# Patient Record
Sex: Male | Born: 1975
Health system: Southern US, Community
[De-identification: ages and names within clinical notes are randomized; demographics above are authoritative.]

## PROBLEM LIST (undated history)

## (undated) DIAGNOSIS — K529 Noninfective gastroenteritis and colitis, unspecified: Secondary | ICD-10-CM

## (undated) DIAGNOSIS — I10 Essential (primary) hypertension: Secondary | ICD-10-CM

## (undated) DIAGNOSIS — F988 Other specified behavioral and emotional disorders with onset usually occurring in childhood and adolescence: Secondary | ICD-10-CM

## (undated) DIAGNOSIS — R42 Dizziness and giddiness: Secondary | ICD-10-CM

## (undated) DIAGNOSIS — Z8673 Personal history of transient ischemic attack (TIA), and cerebral infarction without residual deficits: Secondary | ICD-10-CM

## (undated) HISTORY — DX: Personal history of transient ischemic attack (TIA), and cerebral infarction without residual deficits: Z86.73

## (undated) HISTORY — PX: MOUTH SURGERY: SHX715

---

## 2003-01-28 ENCOUNTER — Encounter: Payer: Self-pay | Admitting: Pulmonary Disease

## 2003-06-22 ENCOUNTER — Encounter: Payer: Self-pay | Admitting: Pulmonary Disease

## 2003-09-06 ENCOUNTER — Emergency Department (HOSPITAL_COMMUNITY): Admission: EM | Admit: 2003-09-06 | Discharge: 2003-09-07 | Payer: Self-pay

## 2004-01-22 ENCOUNTER — Emergency Department (HOSPITAL_COMMUNITY): Admission: EM | Admit: 2004-01-22 | Discharge: 2004-01-22 | Payer: Self-pay | Admitting: Emergency Medicine

## 2004-01-23 ENCOUNTER — Inpatient Hospital Stay (HOSPITAL_COMMUNITY): Admission: EM | Admit: 2004-01-23 | Discharge: 2004-01-26 | Payer: Self-pay | Admitting: Emergency Medicine

## 2005-01-13 ENCOUNTER — Encounter: Admission: RE | Admit: 2005-01-13 | Discharge: 2005-01-13 | Payer: Self-pay | Admitting: Family Medicine

## 2006-01-04 ENCOUNTER — Emergency Department (HOSPITAL_COMMUNITY): Admission: EM | Admit: 2006-01-04 | Discharge: 2006-01-04 | Payer: Self-pay | Admitting: Emergency Medicine

## 2007-01-06 ENCOUNTER — Emergency Department (HOSPITAL_COMMUNITY): Admission: EM | Admit: 2007-01-06 | Discharge: 2007-01-06 | Payer: Self-pay | Admitting: Family Medicine

## 2007-01-28 ENCOUNTER — Emergency Department (HOSPITAL_COMMUNITY): Admission: EM | Admit: 2007-01-28 | Discharge: 2007-01-28 | Payer: Self-pay | Admitting: Family Medicine

## 2007-10-31 ENCOUNTER — Emergency Department (HOSPITAL_COMMUNITY): Admission: EM | Admit: 2007-10-31 | Discharge: 2007-10-31 | Payer: Self-pay | Admitting: Emergency Medicine

## 2008-04-30 ENCOUNTER — Emergency Department (HOSPITAL_COMMUNITY): Admission: EM | Admit: 2008-04-30 | Discharge: 2008-04-30 | Payer: Self-pay | Admitting: Emergency Medicine

## 2009-01-05 ENCOUNTER — Emergency Department (HOSPITAL_COMMUNITY): Admission: EM | Admit: 2009-01-05 | Discharge: 2009-01-05 | Payer: Self-pay | Admitting: Emergency Medicine

## 2009-07-09 ENCOUNTER — Emergency Department (HOSPITAL_BASED_OUTPATIENT_CLINIC_OR_DEPARTMENT_OTHER): Admission: EM | Admit: 2009-07-09 | Discharge: 2009-07-09 | Payer: Self-pay | Admitting: Emergency Medicine

## 2010-01-17 ENCOUNTER — Ambulatory Visit: Payer: Self-pay | Admitting: Internal Medicine

## 2010-01-17 DIAGNOSIS — E785 Hyperlipidemia, unspecified: Secondary | ICD-10-CM

## 2010-01-17 DIAGNOSIS — I152 Hypertension secondary to endocrine disorders: Secondary | ICD-10-CM | POA: Insufficient documentation

## 2010-01-17 DIAGNOSIS — G4733 Obstructive sleep apnea (adult) (pediatric): Secondary | ICD-10-CM

## 2010-01-17 DIAGNOSIS — Z9989 Dependence on other enabling machines and devices: Secondary | ICD-10-CM

## 2010-01-17 DIAGNOSIS — E7800 Pure hypercholesterolemia, unspecified: Secondary | ICD-10-CM | POA: Insufficient documentation

## 2010-01-17 DIAGNOSIS — I10 Essential (primary) hypertension: Secondary | ICD-10-CM

## 2010-01-17 DIAGNOSIS — E1110 Type 2 diabetes mellitus with ketoacidosis without coma: Secondary | ICD-10-CM

## 2010-01-18 ENCOUNTER — Telehealth: Payer: Self-pay | Admitting: Internal Medicine

## 2010-01-22 ENCOUNTER — Encounter: Payer: Self-pay | Admitting: Internal Medicine

## 2010-01-31 ENCOUNTER — Telehealth: Payer: Self-pay | Admitting: Internal Medicine

## 2010-02-02 ENCOUNTER — Ambulatory Visit: Payer: Self-pay | Admitting: Pulmonary Disease

## 2010-02-03 DIAGNOSIS — J45909 Unspecified asthma, uncomplicated: Secondary | ICD-10-CM

## 2010-03-08 ENCOUNTER — Ambulatory Visit (HOSPITAL_BASED_OUTPATIENT_CLINIC_OR_DEPARTMENT_OTHER): Admission: RE | Admit: 2010-03-08 | Discharge: 2010-03-08 | Payer: Self-pay | Admitting: Pulmonary Disease

## 2010-03-08 ENCOUNTER — Encounter: Payer: Self-pay | Admitting: Pulmonary Disease

## 2010-03-15 ENCOUNTER — Ambulatory Visit: Payer: Self-pay | Admitting: Pulmonary Disease

## 2010-03-22 ENCOUNTER — Ambulatory Visit: Payer: Self-pay | Admitting: Pulmonary Disease

## 2010-03-22 DIAGNOSIS — M549 Dorsalgia, unspecified: Secondary | ICD-10-CM

## 2010-03-22 DIAGNOSIS — F17200 Nicotine dependence, unspecified, uncomplicated: Secondary | ICD-10-CM | POA: Insufficient documentation

## 2010-04-19 ENCOUNTER — Encounter: Payer: Self-pay | Admitting: Pulmonary Disease

## 2010-05-18 ENCOUNTER — Emergency Department (HOSPITAL_BASED_OUTPATIENT_CLINIC_OR_DEPARTMENT_OTHER): Admission: EM | Admit: 2010-05-18 | Discharge: 2010-05-18 | Payer: Self-pay | Admitting: Emergency Medicine

## 2010-10-14 ENCOUNTER — Encounter: Payer: Self-pay | Admitting: Family Medicine

## 2010-10-15 ENCOUNTER — Emergency Department (HOSPITAL_BASED_OUTPATIENT_CLINIC_OR_DEPARTMENT_OTHER)
Admission: EM | Admit: 2010-10-15 | Discharge: 2010-10-15 | Payer: Self-pay | Source: Home / Self Care | Admitting: Emergency Medicine

## 2010-10-21 LAB — CONVERTED CEMR LAB
ALT: 27 units/L (ref 0–53)
AST: 23 units/L (ref 0–37)
Albumin: 3.5 g/dL (ref 3.5–5.2)
Alkaline Phosphatase: 107 units/L (ref 39–117)
BUN: 9 mg/dL (ref 6–23)
Basophils Absolute: 0.1 10*3/uL (ref 0.0–0.1)
Basophils Relative: 0.7 % (ref 0.0–3.0)
Bilirubin, Direct: 0.2 mg/dL (ref 0.0–0.3)
CO2: 27 meq/L (ref 19–32)
Calcium: 9 mg/dL (ref 8.4–10.5)
Chloride: 102 meq/L (ref 96–112)
Cholesterol: 154 mg/dL (ref 0–200)
Creatinine, Ser: 0.6 mg/dL (ref 0.4–1.5)
Creatinine,U: 184.9 mg/dL
Direct LDL: 64.5 mg/dL
Eosinophils Absolute: 0.5 10*3/uL (ref 0.0–0.7)
Eosinophils Relative: 4.9 % (ref 0.0–5.0)
GFR calc non Af Amer: 166.6 mL/min (ref >60)
Glucose, Bld: 280 mg/dL — ABNORMAL HIGH (ref 70–99)
HCT: 44.1 % (ref 39.0–52.0)
HDL: 32.2 mg/dL — ABNORMAL LOW (ref >39.00)
Hemoglobin, Urine: NEGATIVE
Hemoglobin: 15.2 g/dL (ref 13.0–17.0)
Hgb A1c MFr Bld: 9.9 % — ABNORMAL HIGH (ref 4.6–6.5)
Ketones, ur: 40 mg/dL
Leukocytes, UA: NEGATIVE
Lymphocytes Relative: 25.3 % (ref 12.0–46.0)
Lymphs Abs: 2.8 10*3/uL (ref 0.7–4.0)
MCHC: 34.5 g/dL (ref 30.0–36.0)
MCV: 80.8 fL (ref 78.0–100.0)
Microalb Creat Ratio: 135.2 mg/g — ABNORMAL HIGH (ref 0.0–30.0)
Microalb, Ur: 25 mg/dL — ABNORMAL HIGH (ref 0.0–1.9)
Monocytes Absolute: 0.5 10*3/uL (ref 0.1–1.0)
Monocytes Relative: 4.3 % (ref 3.0–12.0)
Neutro Abs: 7.3 10*3/uL (ref 1.4–7.7)
Neutrophils Relative %: 64.8 % (ref 43.0–77.0)
Nitrite: NEGATIVE
Platelets: 394 10*3/uL (ref 150.0–400.0)
Potassium: 4.4 meq/L (ref 3.5–5.1)
RBC: 5.46 M/uL (ref 4.22–5.81)
RDW: 14.1 % (ref 11.5–14.6)
Sodium: 138 meq/L (ref 135–145)
Specific Gravity, Urine: >=1.03 (ref 1.000–1.030)
TSH: 1.3 microintl units/mL (ref 0.35–5.50)
Total Bilirubin: 1 mg/dL (ref 0.3–1.2)
Total CHOL/HDL Ratio: 5
Total Protein, Urine: 100 mg/dL
Total Protein: 7.5 g/dL (ref 6.0–8.3)
Triglycerides: 334 mg/dL — ABNORMAL HIGH (ref 0.0–149.0)
Urine Glucose: 500 mg/dL
Urobilinogen, UA: 0.2 (ref 0.0–1.0)
VLDL: 66.8 mg/dL — ABNORMAL HIGH (ref 0.0–40.0)
WBC: 11.2 10*3/uL — ABNORMAL HIGH (ref 4.5–10.5)
pH: 5.5 (ref 5.0–8.0)

## 2010-10-23 NOTE — Assessment & Plan Note (Signed)
Summary: rov after npsg and spiro pre and post   Visit Type:  Follow-up Primary Provider/Referring Provider:  Etta Grandchild MD  CC:  Pt here for follow after sleep study and pre/post. Pt states he is smoking 1/2 ppd.  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 He reports asthma since childhood, never hospitalised, last urgent care visit 2 yrs ago, worse during spring, prefers primatene mist to albuterol mdi (less expensive). He was diagnosed with severe obstructive sleep apnea after a sleep study at [facility] in 2005 & has been on cpap ever since, cannot tolerate a single night off it. Has not received supplies x 2 yrs. Epworth Sleepiness Score 1. Bedtime is 2300-0100, weekends 0300, latency minimal, 1-2 BR visits, no post void latency, wakes up at 0600 for work, 1100 on weekends feeling refreshed.no dryness or headaches. He drinks a 'lot of coffee' but has given this up x 1 week together with cigs.  March 22, 2010 12:03 PM  Baseline PSG 10/04 , wt 352, BMI 53 showed RDI 30/h, corrected by CPAP 9 cm CPAP currently set at 14 cm, PSG showed this level corrects events but needs 19 cm to abolish snoring - obtain download at current level . C/o back pain - acute onset, no weakness of extremities. Spirometry FEv1 50%, pos BD response Fiancee not c/o snoring, no wheezing , cough or clearing of throat.    Preventive Screening-Counseling & Management  Alcohol-Tobacco     Smoking Status: current  Current Medications (verified): 1)  Enalapril Maleate 5 Mg Tabs (Enalapril Maleate) .... Take 1 Tablet By Mouth Once A Day 2)  Glyburide-Metformin 2.5-500 Mg Tabs (Glyburide-Metformin) .... Take 1 Tablet By Mouth Once A Day 3)  Accu-Chek Aviva  Strp (Glucose Blood) .... Test Once Daily As Directed 4)  Primatene Mist 0.22 Mg/act Aers (Epinephrine Base) .... As Needed  Allergies (verified): No Known Drug  Allergies  Past History:  Past Medical History: Last updated: 01/17/2010 Diabetes mellitus, type II Hyperlipidemia Hypertension  Social History: Last updated: 03/22/2010 Occupation: Information Technology Alcohol use-no Drug use-no Regular exercise-no Separated Patient is a current smoker.   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  Smoking Status:  current  Review of Systems       The patient complains of dyspnea on exertion.  The patient denies anorexia, fever, weight loss, weight gain, vision loss, decreased hearing, hoarseness, chest pain, syncope, peripheral edema, prolonged cough, headaches, hemoptysis, abdominal pain, melena, hematochezia, severe indigestion/heartburn, hematuria, muscle weakness, suspicious skin lesions, difficulty walking, depression, unusual weight change, and abnormal bleeding.    Vital Signs:  Patient profile:   35 year old male Height:      69 inches Weight:      365 pounds BMI:     54.10 O2 Sat:      95 % on Room air Temp:     98.2 degrees F oral Pulse rate:   89 / minute BP sitting:   128 / 70  (left arm) Cuff size:   large  Vitals Entered By: Zackery Barefoot CMA (March 22, 2010 11:44 AM)  O2 Flow:  Room air CC: Pt here for follow after sleep study and pre/post. Pt states he is smoking 1/2 ppd Comments Medications reviewed with patient Verified contact number and pharmacy with patient Zackery Barefoot CMA  March 22, 2010 11:45 AM  Physical Exam  Additional Exam:  Gen. Pleasant, obese, in no distress, normal affect ENT - no lesions, no post nasal drip, class 3 airway Neck: No JVD, no thyromegaly, no carotid bruits Lungs: no use of accessory muscles, no dullness to percussion, clear without rales or rhonchi  Cardiovascular: Rhythm regular, heart sounds  normal, no murmurs or gallops, no peripheral edema Musculoskeletal: No  deformities, no cyanosis or clubbing      Impression & Recommendations:  Problem # 1:  ASTHMA (ICD-493.90) reversible component Trial of symbicort - he is woried about cost - discussde long term effects of oral steroids & why maintenance medicationi sporeferred - for sake of compliance would prefer at bedtime dosing  Problem # 2:  TOBACCO ABUSE (ICD-305.1) not willing to commit to quit attempt His updated medication list for this problem includes:    Nicotrol 10 Mg Inha (Nicotine) .Marland Kitchen..Marland Kitchen Two times a day as needed  Problem # 3:  SLEEP APNEA (ICD-780.57) CPAP 14 cm - will not try to abolish snoring since fiance not complaining. Compliance encouraged, wt loss emphasized, asked to avoid meds with sedative side effects, cautioned against driving when sleepy.  Orders: Est. Patient Level IV (04540) Prescription Created Electronically [phone])  Problem # 4:  BACK PAIN (ICD-724.5) tramadol as needed two times a day   Medications Added to Medication List This Visit: 1)  Nicotrol 10 Mg Inha (Nicotine) .... Two times a day as needed 2)  Symbicort 80-4.5 Mcg/act Aero (Budesonide-formoterol fumarate) .... 2 puffs at bedtime - rinse mouth after 3)  Tramadol Hcl 50 Mg Tabs (Tramadol hcl) .... Two times a day as needed  Patient Instructions: 1)  Copy sent to: 2)  Please schedule a follow-up appointment in 2 months. 3)  Trial of nicotrol inhaler 4)  Symbicort 80/4.5 2 puffs at bedtime - RINSE mouth after 5)  stay on CPAP 14 cm Prescriptions: TRAMADOL HCL 50 MG TABS (TRAMADOL HCL) two times a day as needed  #30 x 1   Entered and Authorized by:   Comer Locket. Vassie Loll MD   Signed by:   Comer Locket Vassie Loll MD on 03/22/2010   Method used:   Electronically to        [facility]. [address] [phone]* (retail)       [address]       [place], [place]  [address]       Ph: [phone] or [phone]       Fax: [phone]   RxID:   [id number] SYMBICORT 80-4.5 MCG/ACT AERO  (BUDESONIDE-FORMOTEROL FUMARATE) 2 puffs at bedtime - RINSE mouth after  #1 x 2   Entered and Authorized by:   Comer Locket Vassie Loll MD   Signed by:   Comer Locket Vassie Loll MD on 03/22/2010   Method used:   Electronically to        [facility]. [address] [phone]* (retail)       [address]       [place], [place]  [address]       Ph: [phone] or [phone]       Fax: [phone]   RxID:   [id number] NICOTROL 10 MG INHA (NICOTINE) two times a day as needed  #30 x 1   Entered and Authorized by:   Comer Locket. Vassie Loll MD   Signed by:   Comer Locket Vassie Loll MD on 03/22/2010   Method used:  Electronically to        [facility]. [address] [phone]* (retail)       [address]       [place], [place]  [address]       Ph: [phone] or [phone]       Fax: [phone]   RxID:   [id number]    Immunization History:  Influenza Immunization History:    Influenza:  historical (06/26/2009)    Appended Document: rov after npsg and spiro pre and post good compliance on download 6/20 -04/18/10

## 2010-10-23 NOTE — Assessment & Plan Note (Signed)
Summary: sleep apnea/jd   Primary Provider/Referring Provider:  Etta Grandchild MD  CC:  Pt here for sleep consult.  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 He reports asthma since childhood, never hospitalised, last urgent care visit 2 yrs ago, worse during spring, prefers primatene mist to albuterol mdi (less expensive). He was diagnosed with severe obstructive sleep apnea after a sleep study at [facility] in 2005 & has been on cpap ever since, cannot tolerate a single night off it. Has not received supplies x 2 yrs. Epworth Sleepiness Score 1. Bedtime is 2300-0100, weekends 0300, latency minimal, 1-2 BR visits, no post void latency, wakes up at 0600 for work, 1100 on weekends feeling refreshed.no dryness or headaches. He drinks a 'lot of coffee' but has given this up x 1 week together with cigs. There is no history suggestive of cataplexy, sleep paralysis or parasomnias    Preventive Screening-Counseling & Management  Alcohol-Tobacco     Smoking Status: quit     Packs/Day: 1.0     Year Started: 2004     Year Quit: 2011   History of Present Illness: I have sleep apnea  What time do you typically go to bed?(between what hours): 11:00pm-1:30am  How long does it take you to fall asleep? not long at all, just a few minutes  How many times during the night do you wake up? 1 or 2 times for bathroom  What time do you get out of bed to start your day? 6:00am  Do you drive or operate heavy machinery in your occupation? no  How much has your weight changed (up or down) over the past two years? (in pounds): 350-371 lbs Current 360  Have you ever had a sleep study before?  If yes,when and where: yes 2005@ [facility]  Do you currently use CPAP ? If so , at what pressure? yes I do not know pressure  Do you wear oxygen at any time? If yes, how many liters per minute? no Current Medications  (verified): 1)  Enalapril Maleate 5 Mg Tabs (Enalapril Maleate) .... Take 1 Tablet By Mouth Once A Day 2)  Glyburide-Metformin 2.5-500 Mg Tabs (Glyburide-Metformin) .... Take 1 Tablet By Mouth Once A Day 3)  Accu-Chek Aviva  Strp (Glucose Blood) .... Test Once Daily As Directed 4)  Primatene Mist 0.22 Mg/act Aers (Epinephrine Base) .... 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 (Quit Jan 30, 2010 1 ppd) Smoking Status:  quit Packs/Day:  1.0  Review of Systems  The patient denies shortness of breath with activity, shortness of breath at rest, productive cough, non-productive cough, coughing up blood, chest pain, irregular heartbeats, acid heartburn, indigestion, loss of appetite, weight change, abdominal pain, difficulty swallowing, sore throat, tooth/dental problems, headaches, nasal congestion/difficulty breathing through nose, sneezing, itching, ear ache, anxiety, depression, hand/feet swelling, joint stiffness or pain, rash, change in color of mucus, and fever.    Vital Signs:  Patient profile:   35 year old male Height:      69 inches O2 Sat:      97 % on Room air Temp:     99.1 degrees F oral Pulse  rate:   95 / minute BP sitting:   136 / 78  (left arm) Cuff size:   large  Vitals Entered By: Zackery Barefoot CMA (Feb 02, 2010 3:37 PM)  O2 Flow:  Room air CC: Pt here for sleep consult Comments Medications reviewed with patient Verified contact number and pharmacy with patient Zackery Barefoot CMA  Feb 02, 2010 3:38 PM    Physical Exam  Additional Exam:  Gen. Pleasant, obese, in no distress, normal affect ENT - no  lesions, no post nasal drip, class 3 airway Neck: No JVD, no thyromegaly, no carotid bruits Lungs: no use of accessory muscles, no dullness to percussion, clear without rales or rhonchi  Cardiovascular: Rhythm regular, heart sounds  normal, no murmurs or gallops, no peripheral edema Abdomen: soft and non-tender, no hepatosplenomegaly, BS normal. Musculoskeletal: No deformities, no cyanosis or clubbing Neuro:  alert, non focal     Impression & Recommendations:  Problem # 1:  ASTHMA (ICD-493.90) mild intermittent, if at all. Discussed dangers of primatene in a diabetic , hypretensive would prefer albuterol - or even singulair if predominant allergic spiro-pre/post   Problem # 2:  SLEEP APNEA (ICD-780.57) The pathophysiology of obstructive sleep apnea, it's cardiovascular consequences and modes of treatment including CPAP were discussed with the patient in great detail.  New supplies Compliance encouraged, wt loss emphasized, asked to avoid meds with sedative side effects, cautioned against driving when sleepy.  Orders: Sleep Disorder Referral (Sleep Disorder) Pulmonary Referral (Pulmonary) Consultation Level IV (65784) DME Referral (DME)  Medications Added to Medication List This Visit: 1)  Primatene Mist 0.22 Mg/act Aers (Epinephrine base) .... As needed  Patient Instructions: 1)  CPAP titration study 2)  Please schedule a follow-up appointment in 2 weeks after sleep study. 3)  Spirometry -pre/post  Appended Document: sleep apnea/jd Baseline PSG 10/04 , wt 352, BMI 53 showed RDI 30/h, corrected by CPAP 9 cm  Appended Document: Orders Update CPAP currently set at 14 cm, PSG showed this level corrects events but needs 19 cm to abolish snoring - obtain download at current level & FU OV   Clinical Lists Changes  Orders: Added new Referral order of DME Referral (DME) - Signed      Appended Document: sleep apnea/jd Spoke with pt and advised of the above recs per RA.  Pt  verbalized understanding and states will keep upcoming appt.

## 2010-10-23 NOTE — Progress Notes (Signed)
Summary: lab results  Phone Note Call from Patient Call back at Home Phone [phone]   Caller: Patient Summary of Call: Patient is requesting lab work results//Ok to lmovm Initial call taken by: Rock Nephew CMA,  January 18, 2010 1:33 PM  Follow-up for Phone Call        blood sugars are too high, see letter Follow-up by: Etta Grandchild MD,  Jan 22, 2010 7:56 AM  Additional Follow-up for Phone Call Additional follow up Details #1::        Patient notified and aware that letter has been mailed Additional Follow-up by: Rock Nephew CMA,  Jan 25, 2010 9:55 AM

## 2010-10-23 NOTE — Progress Notes (Signed)
Summary: TEST STRIPS  Phone Note Call from Patient Call back at [facility] [place] Phone [phone]   Summary of Call: Patient is requesting rx for test strips for glucometer.  Initial call taken by: Lamar Sprinkles, CMA,  Jan 31, 2010 5:18 PM  Follow-up for Phone Call        left mess to call office back w/info about glucometer............Marland KitchenLamar Sprinkles, CMA  Jan 31, 2010 6:24 PM   Patient was given Accucheck Aviva aat last appt. Rx sent in.Marland KitchenMarland KitchenAlvy Beal Archie CMA  Feb 01, 2010 2:02 PM     New/Updated Medications: ACCU-CHEK AVIVA  STRP (GLUCOSE BLOOD) Test once daily as directed Prescriptions: ACCU-CHEK AVIVA  STRP (GLUCOSE BLOOD) Test once daily as directed  #100 x 6   Entered by:   Rock Nephew CMA   Authorized by:   Etta Grandchild MD   Signed by:   Rock Nephew CMA on 02/01/2010   Method used:   Electronically to        [facility] [address] [phone]* (retail)       [address]       [place], [place]  [address]       Ph: [phone] or [phone]       Fax: [phone]   RxID:   [id number]

## 2010-10-23 NOTE — Miscellaneous (Signed)
Summary: Orders Update pft charges  Clinical Lists Changes  Orders: Added new Service order of Carbon Monoxide diffusing w/capacity (94720) - Signed Added new Service order of Lung Volumes (94240) - Signed Added new Service order of Spirometry (Pre & Post) (94060) - Signed 

## 2010-10-23 NOTE — Letter (Signed)
Summary: Results Follow-up Letter  [facility]  [address] [place], [place] [address]   Phone: [phone]  Fax: [phone]    01/22/2010  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

## 2010-10-23 NOTE — Letter (Signed)
Summary: Lipid Letter  [facility]  [address] [place], [place] [address]   Phone: [phone]  Fax: [phone]    01/22/2010  [name] [address] [place], [place]  [address]  Dear Gerlene Burdock:  We have carefully reviewed your last lipid profile from  and the results are noted below with a summary of recommendations for lipid management.    Cholesterol:       154     Goal: <200   HDL "good" Cholesterol:   57.84     Goal: >40   LDL "bad" Cholesterol:   65     Goal: <130   Triglycerides:       334.0     Goal: <150    the triglycerides are too high    TLC Diet (Therapeutic Lifestyle Change): Saturated Fats & Transfatty acids should be kept < 7% of total calories ***Reduce Saturated Fats Polyunstaurated Fat can be up to 10% of total calories Monounsaturated Fat Fat can be up to 20% of total calories Total Fat should be no greater than 25-35% of total calories Carbohydrates should be 50-60% of total calories Protein should be approximately 15% of total calories Fiber should be at least 20-30 grams a day ***Increased fiber may help lower LDL Total Cholesterol should be < 200mg /day Consider adding plant stanol/sterols to diet (example: Benacol spread) ***A higher intake of unsaturated fat may reduce Triglycerides and Increase HDL    Adjunctive Measures (may lower LIPIDS and reduce risk of Heart Attack) include: Aerobic Exercise (20-30 minutes 3-4 times a week) Limit Alcohol Consumption Weight Reduction Aspirin 75-81 mg a day by mouth (if not allergic or contraindicated) Dietary Fiber 20-30 grams a day by mouth     Current Medications: 1)    Enalapril Maleate 5 Mg Tabs (Enalapril maleate) .... Take 1 tablet by mouth once a day 2)    Glyburide-metformin 2.5-500 Mg Tabs (Glyburide-metformin) .... Take 1 tablet by mouth once a day  If you have any questions, please call. We appreciate being able to work with you.   Sincerely,    [facility] Etta Grandchild MD

## 2010-10-23 NOTE — Assessment & Plan Note (Signed)
Summary: NEW [facility] PT---#--STC   Vital Signs:  Patient profile:   35 year old male Height:      69 inches Weight:      356 pounds BMI:     52.76 O2 Sat:      95 % on Room air Temp:     98.6 degrees F oral Pulse rate:   96 / minute Pulse rhythm:   regular Resp:     16 per minute BP sitting:   134 / 84  (left arm) Cuff size:   large  Vitals Entered By: Rock Nephew CMA (January 17, 2010 1:34 PM)  Nutrition Counseling: Patient's BMI is greater than 25 and therefore counseled on weight management options.  O2 Flow:  Room air  Primary Care Provider:  Etta Grandchild MD   History of Present Illness:  Follow-Up Visit      This is a 35 year old man who presents for Follow-up visit.  The patient complains of high blood sugar symptoms, but denies chest pain, palpitations, dizziness, syncope, edema, SOB, DOE, PND, and orthopnea.  The patient reports not taking meds as prescribed, not monitoring BP, not monitoring blood sugars, and dietary noncompliance.  When questioned about possible medication side effects, the patient notes none.    Also, he wants to be screened for STD's. He recently found out that his wife has had atleast 15 sexual partners in the last 5 years. He has never had an STD and he says that he was vaccinated for Hep. B in 2001.  Preventive Screening-Counseling & Management  Alcohol-Tobacco     Alcohol drinks/day: 0     Smoking Status: current     Smoking Cessation Counseling: yes     Smoke Cessation Stage: precontemplative     Packs/Day: <0.25     Year Started: 2001     Pack years: 5     Tobacco Counseling: to quit use of tobacco products  Caffeine-Diet-Exercise     Does Patient Exercise: no  Hep-HIV-STD-Contraception     Hepatitis Risk: risk noted     HIV Risk: risk noted     STD Risk: risk noted     STD Risk Counseling: to avoid increased STD risk     TSE monthly: yes     Testicular SE Education/Counseling to perform regular STE  Safety-Violence-Falls  Seat Belt Use: yes     Helmet Use: yes     Firearms in the Home: no firearms in the home     Smoke Detectors: yes     Violence in the Home: no risk noted     Sexual Abuse: no      Drug Use:  never and no.        Blood Transfusions:  no.    Medications Prior to Update: 1)  None  Current Medications (verified): 1)  Enalapril Maleate 5 Mg Tabs (Enalapril Maleate) .... Take 1 Tablet By Mouth Once A Day 2)  Glyburide-Metformin 2.5-500 Mg Tabs (Glyburide-Metformin) .... Take 1 Tablet By Mouth Once A Day  Allergies (verified): No Known Drug Allergies  Past History:  Past Medical History: Diabetes mellitus, type II Hyperlipidemia Hypertension  Past Surgical History: Denies surgical history  Family History: Reviewed history and no changes required. Family History of Arthritis Family History Diabetes 1st degree relative Family History High cholesterol Family History Kidney disease Family History of Prostate CA 1st degree relative <50  Social History: Reviewed history and no changes required. Occupation: Firefighter Alcohol use-no Drug use-no  Regular exercise-no Married Current Smoker Drug Use:  never, no Does Patient Exercise:  no Smoking Status:  current Packs/Day:  <0.25 Hepatitis Risk:  risk noted HIV Risk:  risk noted STD Risk:  risk noted Seat Belt Use:  yes Blood Transfusions:  no  Review of Systems  The patient denies anorexia, fever, weight loss, weight gain, vision loss, chest pain, syncope, dyspnea on exertion, peripheral edema, prolonged cough, headaches, hemoptysis, abdominal pain, melena, hematochezia, severe indigestion/heartburn, hematuria, genital sores, suspicious skin lesions, enlarged lymph nodes, angioedema, and testicular masses.   GU:  Denies discharge, dysuria, genital sores, hematuria, nocturia, urinary frequency, and urinary hesitancy. Endo:  Denies cold intolerance, excessive hunger, excessive thirst, excessive urination, heat  intolerance, polyuria, and weight change.  Physical Exam  General:  alert, well-developed, well-nourished, well-hydrated, and overweight-appearing.   Head:  normocephalic, atraumatic, no abnormalities observed, and no abnormalities palpated.   Eyes:  vision grossly intact, pupils equal, pupils round, and pupils reactive to light.   Mouth:  Oral mucosa and oropharynx without lesions or exudates.  Teeth in good repair. Neck:  No deformities, masses, or tenderness noted. Lungs:  Normal respiratory effort, chest expands symmetrically. Lungs are clear to auscultation, no crackles or wheezes. Heart:  Normal rate and regular rhythm. S1 and S2 normal without gallop, murmur, click, rub or other extra sounds. Abdomen:  Bowel sounds positive,abdomen soft and non-tender without masses, organomegaly or hernias noted. Genitalia:  circumcised, no hydrocele, no varicocele, no scrotal masses, no testicular masses or atrophy, no cutaneous lesions, and no urethral discharge.   Msk:  No deformity or scoliosis noted of thoracic or lumbar spine.   Pulses:  R and L carotid,radial,femoral,dorsalis pedis and posterior tibial pulses are full and equal bilaterally Extremities:  No clubbing, cyanosis, edema, or deformity noted with normal full range of motion of all joints.   Neurologic:  No cranial nerve deficits noted. Station and gait are normal. Plantar reflexes are down-going bilaterally. DTRs are symmetrical throughout. Sensory, motor and coordinative functions appear intact. Skin:  turgor normal, color normal, no rashes, no suspicious lesions, no ecchymoses, no petechiae, no purpura, no ulcerations, and no edema.   Cervical Nodes:  no anterior cervical adenopathy and no posterior cervical adenopathy.   Axillary Nodes:  no R axillary adenopathy and no L axillary adenopathy.   Inguinal Nodes:  no R inguinal adenopathy and no L inguinal adenopathy.   Psych:  Cognition and judgment appear intact. Alert and cooperative  with normal attention span and concentration. No apparent delusions, illusions, hallucinations  Diabetes Management Exam:    Foot Exam (with socks and/or shoes not present):       Sensory-Pinprick/Light touch:          Left medial foot (L-4): normal          Left dorsal foot (L-5): normal          Left lateral foot (S-1): normal          Right medial foot (L-4): normal          Right dorsal foot (L-5): normal          Right lateral foot (S-1): normal       Sensory-Monofilament:          Left foot: normal          Right foot: normal       Inspection:          Left foot: normal  Right foot: normal       Nails:          Left foot: normal          Right foot: normal    Eye Exam:       Eye Exam done elsewhere          Date: 03/08/2009          Results: normal          Done by: ???   Impression & Recommendations:  Problem # 1:  SEXUAL ACTIVITY, HIGH RISK (ICD-V69.2) Assessment New  Orders: Venipuncture (16109) TLB-Lipid Panel (80061-LIPID) TLB-BMP (Basic Metabolic Panel-BMET) (80048-METABOL) TLB-CBC Platelet - w/Differential (85025-CBCD) TLB-Hepatic/Liver Function Pnl (80076-HEPATIC) TLB-TSH (Thyroid Stimulating Hormone) (84443-TSH) TLB-A1C / Hgb A1C (Glycohemoglobin) (83036-A1C) TLB-Microalbumin/Creat Ratio, Urine (82043-MALB) TLB-Udip w/ Micro (81001-URINE) T-Chlamydia  Probe, urine (60454-09811) T-GC Probe, urine (91478-29562) T-HIV Antibody  (Reflex) [id number]) T-RPR (Syphilis) (96295-28413) T-Hepatitis A Antibody (24401-02725)  Problem # 2:  SLEEP APNEA (ICD-780.57) Assessment: Unchanged  Orders: Venipuncture (36644) TLB-Lipid Panel (80061-LIPID) TLB-BMP (Basic Metabolic Panel-BMET) (80048-METABOL) TLB-CBC Platelet - w/Differential (85025-CBCD) TLB-Hepatic/Liver Function Pnl (80076-HEPATIC) TLB-TSH (Thyroid Stimulating Hormone) (84443-TSH) TLB-A1C / Hgb A1C (Glycohemoglobin) (83036-A1C) TLB-Microalbumin/Creat Ratio, Urine (82043-MALB) TLB-Udip w/  Micro (81001-URINE) T-Chlamydia  Probe, urine (03474-25956) T-GC Probe, urine [id number]) T-HIV Antibody  (Reflex) [id number]) T-RPR (Syphilis) (30160-10932) Sleep Disorder Referral (Sleep Disorder)  Problem # 3:  HYPERTENSION (ICD-401.9) Assessment: Improved  His updated medication list for this problem includes:    Enalapril Maleate 5 Mg Tabs (Enalapril maleate) .Marland Kitchen... Take 1 tablet by mouth once a day  Orders: Venipuncture (35573) TLB-Lipid Panel (80061-LIPID) TLB-BMP (Basic Metabolic Panel-BMET) (80048-METABOL) TLB-CBC Platelet - w/Differential (85025-CBCD) TLB-Hepatic/Liver Function Pnl (80076-HEPATIC) TLB-TSH (Thyroid Stimulating Hormone) (84443-TSH) TLB-A1C / Hgb A1C (Glycohemoglobin) (83036-A1C) TLB-Microalbumin/Creat Ratio, Urine (82043-MALB) TLB-Udip w/ Micro (81001-URINE) T-Chlamydia  Probe, urine (22025-42706) T-GC Probe, urine (23762-83151) T-HIV Antibody  (Reflex) ([id number]) T-RPR (Syphilis) (62694-85462)  BP today: 134/84  Problem # 4:  DIABETES MELLITUS, TYPE II (ICD-250.00) Assessment: Deteriorated he will provide records from Dr. Thedora Hinders from [facility] for me to review. His updated medication list for this problem includes:    Enalapril Maleate 5 Mg Tabs (Enalapril maleate) .Marland Kitchen... Take 1 tablet by mouth once a day    Glyburide-metformin 2.5-500 Mg Tabs (Glyburide-metformin) .Marland Kitchen... Take 1 tablet by mouth once a day  Orders: Venipuncture (70350) TLB-Lipid Panel (80061-LIPID) TLB-BMP (Basic Metabolic Panel-BMET) (80048-METABOL) TLB-CBC Platelet - w/Differential (85025-CBCD) TLB-Hepatic/Liver Function Pnl (80076-HEPATIC) TLB-TSH (Thyroid Stimulating Hormone) (84443-TSH) TLB-A1C / Hgb A1C (Glycohemoglobin) (83036-A1C) TLB-Microalbumin/Creat Ratio, Urine (82043-MALB) TLB-Udip w/ Micro (81001-URINE) T-Chlamydia  Probe, urine (09381-82993) T-GC Probe, urine (71696-78938) T-HIV Antibody  (Reflex) [id number]) T-RPR (Syphilis)  (52778-24235) Ophthalmology Referral (Ophthalmology)  Complete Medication List: 1)  Enalapril Maleate 5 Mg Tabs (Enalapril maleate) .... Take 1 tablet by mouth once a day 2)  Glyburide-metformin 2.5-500 Mg Tabs (Glyburide-metformin) .... Take 1 tablet by mouth once a day  Patient Instructions: 1)  Please schedule a follow-up appointment in 1 month. 2)  Tobacco is very bad for your health and your loved ones! You Should stop smoking!. 3)  Stop Smoking Tips: Choose a Quit date. Cut down before the Quit date. decide what you will do as a substitute when you feel the urge to smoke(gum,toothpick,exercise). 4)  It is important that you exercise regularly at least 20 minutes 5 times a week. If you develop chest pain, have severe difficulty breathing, or feel very tired , stop exercising immediately and seek medical attention. 5)  You need to lose weight. Consider a lower calorie diet and regular exercise.  6)  If you could be exposed to sexually transmitted diseases, you should use a condom. 7)  Check your blood sugars regularly. If your readings are usually above 200  or below 70 you should contact our office. 8)  It is important that your Diabetic A1c level is checked every 3 months. 9)  See your eye doctor yearly to check for diabetic eye damage. 10)  Check your feet each night for sore areas, calluses or signs of infection. 11)  Check your Blood Pressure regularly. If it is above 130/80: you should make an appointment. Prescriptions: GLYBURIDE-METFORMIN 2.5-500 MG TABS (GLYBURIDE-METFORMIN) Take 1 tablet by mouth once a day  #180 x 3   Entered and Authorized by:   Etta Grandchild MD   Signed by:   Etta Grandchild MD on 01/17/2010   Method used:   Electronically to        [facility] [address] [phone]* (retail)       [address]       [place], [place]  [address]       Ph: [phone] or [phone]       Fax: [phone]   RxID:   [id number] ENALAPRIL MALEATE 5 MG  TABS (ENALAPRIL MALEATE) Take 1 tablet by mouth once a day  #90 x 3   Entered and Authorized by:   Etta Grandchild MD   Signed by:   Etta Grandchild MD on 01/17/2010   Method used:   Electronically to        [facility] [address] [phone]* (retail)       [address]       [place], [place]  [address]       Ph: [phone] or [phone]       Fax: [phone]   RxID:   [id number]   Preventive Care Screening  Last Tetanus Booster:    Date:  09/23/2006    Results:  Historical     Immunization History:  Pneumovax Immunization History:    Pneumovax:  historical (08/23/2009)  Hepatitis B Immunization History:    Hepatitis B # 1:  hepb adult (10/02/1999)    Hepatitis B # 2:  hepb adult (11/06/1999)    Hepatitis B # 3:  hepb adult (04/01/2000)

## 2011-01-21 ENCOUNTER — Other Ambulatory Visit: Payer: Self-pay | Admitting: Internal Medicine

## 2011-02-08 NOTE — H&P (Signed)
NAME:  Michael Foster, Michael Foster                           ACCOUNT NO.:  [id number]   MEDICAL RECORD NO.:  [id number]                   PATIENT TYPE:  INP   LOCATION:  3733                                 FACILITY:  [facility]   PHYSICIAN:  Isla Pence, M.D.             DATE OF BIRTH:  1976-07-18   DATE OF ADMISSION:  01/23/2004  DATE OF DISCHARGE:                                HISTORY & PHYSICAL   ADDENDUM:  VITAL SIGNS:  On the physical exam, his pulse oximetry was 89% on  room air. I believe the systolic blood pressure was 153.  GENERAL:  He is no apparent distress until he starts talking, then he gets a  little short winded. He was initially in his son's room in the emergency  room when I walked into his room. He is obese.  HEENT:  His TMs are normal. Oropharynx shows no erythema. He does have some  post nasal drip. Nasal mucosa is slightly boggy.  NECK:  There is no adenopathy.  LUNGS:  Have expiratory wheezes throughout the lung fields, but he is moving  good breath sounds in all of his lung fields. There are no crackles on exam.  I did not hear any bronchial breath sounds.  HEART:  Regular rate and rhythm.  ABDOMEN:  Protuberant, large, obese. Bowel sounds are normal, soft,  nontender.  BACK:  There is no spinous angle tenderness, no CVA angle tenderness. Back  is slightly tender along the anterior axillary line on the right side.  NEUROLOGICAL:  There is no gross neurological deficits.  EXTREMITIES:  Lower extremities:  He has got full range of motion. There is  no edema.                                                Isla Pence, M.D.    RRV/MEDQ  D:  01/23/2004  T:  01/23/2004  Job:  [id number]
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
## 2011-02-08 NOTE — Discharge Summary (Signed)
Michael Foster, Michael Foster                           ACCOUNT NO.:  [id number]   MEDICAL RECORD NO.:  [id number]                   PATIENT TYPE:  INP   LOCATION:  6712                                 FACILITY:  [facility]   PHYSICIAN:  Jackie Plum, M.D.             DATE OF BIRTH:  Aug 06, 1976   DATE OF ADMISSION:  01/23/2004  DATE OF DISCHARGE:  01/24/2004                           DISCHARGE SUMMARY - REFERRING   DISCHARGE DIAGNOSES:  1. Community-acquired pneumonia.  2. Leukocytosis secondary to diagnosis #1 with superimposed adverse effect     of steroids.  Outpatient leukocyte count followed per primary care     physician.  3. History of sleep apnea.  4. History of gastroesophageal reflux disease.  5. History of obesity.  6. History of asthma.  7. History of allergies to Theo-Dur and Slo-bid.   DISCHARGE MEDICATIONS:  The patient is going to continue all his  preadmission medications as previously.  New medicines will be:  1. Humibid DM 1 tab b.i.d.  2. Prednisone 40 mg daily.  3. Ceftin 500 mg t.i.d.  4. Zithromax 500 mg daily.   ACTIVITY:  Activity is as tolerated.   DIET:  Diet will be a regular diet.   SPECIAL INSTRUCTIONS:  The patient is to report to his doctor if he  experiences any problems including fever, chills, shortness of breath.   FOLLOWUP:  Followup will be with [facility], Dr. Leanne Chang,  as scheduled.   CONSULTANTS:  Not applicable.   PROCEDURES:  Not applicable.   CONDITION AT DISCHARGE:  Improved and satisfactory.   DISCHARGE LABORATORIES:  WBC count of 20.3, hemoglobin 15.4, hematocrit of  47.0, MCV 12.6, platelet count 392,000; the smear showed mild left shift  with more than 20% bands.  Sodium 138, potassium 4.1, chloride 105, CO2 28,  glucose 178, BUN 11, creatinine 0.7, calcium 8.5.   REASON FOR HOSPITALIZATION:  Dyspnea secondary to community-acquired  pneumonia with mild asthma exacerbation.  Please see admission H&P by Dr.  Isla Pence dictated on Jan 23, 2004, for further insight into the  patient's  presenting symptoms, signs and evaluation at the time of  admission.  This is a 35 year old gentleman with history of asthma, acute  apnea, who presented with dyspnea.  He had been seen in the emergency room 1  day prior to admission and diagnosed with bilateral pneumonia, at which time  he was given oral prednisone and IM antibiotics and sent home with a  prescription for tetracycline and prednisone.  He had been having some  nonproductive cough without any history of heartburn.   On admission, the patient vital signs were notable for O2 saturation of 89%  on room air and he was said not to be in any acute distress.  He did not  have any JVD.  Lung exam showed expiratory wheezes throughout his lung  fields. His cardiac exam was notable for a  regular rate and rhythm without  any gallops or murmur.  He did not have any edema on his extremity exam.   Lab work on admission was notable for ABG on 50% oxygen with pH of 7.413,  PCO2 of 58, PO2 of 70, bicarb of 24.   X-rays showed patchy right upper lobe and left base infiltrates; he was  therefore admitted to the hospital for community-acquired pneumonia with  mild asthma exacerbation.   HOSPITAL COURSE:  The patient was admitted to the hospitalists' service.  IV  antibiotics were started, supplemental oxygen and IV fluids were also given.  Scheduled bronchodilator nebulizations were prescribed for the patient and  also received some antitussives.  With these measures, the patient's  symptoms started to gradually improve and he is good for discharge today.  On round today, the patient denies any history of shortness of breath, chest  pain, fevers or chills.  He has been eating and drinking well without any  problems.  His temperature is 98.9 degrees Fahrenheit, pulse of 84,  respiratory rate of 20 per minute, BP of 117/50, O2 saturation of 95% on  room air.   He did not have any JVD.  His mucous membranes are moist.  Lung  exam is notable for vesicular breath sounds which are adequate with just a  few occasional wheezes.  Cardiac exam is notable for regular rate and  rhythm, no gallops or murmur.  Abdomen is soft and nontender.  Bowel sounds  are present and they are normoactive.  Extremities are negative for any  edema.  He is alert and oriented x3 and ready for home discharge.  The  patient is to continue antibiotics on discharge to make a total of 10 days  of treatment.  He will take prednisone for the next 2 days, then stop, and  he will be continued on his expectorants as well.  His medications at home  he will be using as usual, as well as his CPAP at home as previously.                                                Jackie Plum, M.D.    GO/MEDQ  D:  01/26/2004  T:  01/26/2004  Job:  [id number]   cc:   Leanne Chang, M.D.  [address]  [place]  [place] [address]  Fax: [phone]

## 2011-02-20 ENCOUNTER — Inpatient Hospital Stay (HOSPITAL_COMMUNITY)
Admission: RE | Admit: 2011-02-20 | Discharge: 2011-02-20 | Disposition: A | Discharge status: Home / Self Care | Payer: 59 | Source: Ambulatory Visit | Attending: Family Medicine | Admitting: Family Medicine

## 2011-02-20 ENCOUNTER — Emergency Department (HOSPITAL_BASED_OUTPATIENT_CLINIC_OR_DEPARTMENT_OTHER)
Admission: EM | Admit: 2011-02-20 | Discharge: 2011-02-20 | Disposition: A | Discharge status: Home / Self Care | Payer: 59 | Attending: Emergency Medicine | Admitting: Emergency Medicine

## 2011-02-20 DIAGNOSIS — I10 Essential (primary) hypertension: Secondary | ICD-10-CM | POA: Insufficient documentation

## 2011-02-20 DIAGNOSIS — X500XXA Overexertion from strenuous movement or load, initial encounter: Secondary | ICD-10-CM | POA: Insufficient documentation

## 2011-02-20 DIAGNOSIS — J45909 Unspecified asthma, uncomplicated: Secondary | ICD-10-CM | POA: Insufficient documentation

## 2011-02-20 DIAGNOSIS — E119 Type 2 diabetes mellitus without complications: Secondary | ICD-10-CM | POA: Insufficient documentation

## 2011-02-20 DIAGNOSIS — Y92009 Unspecified place in unspecified non-institutional (private) residence as the place of occurrence of the external cause: Secondary | ICD-10-CM | POA: Insufficient documentation

## 2011-02-20 DIAGNOSIS — M545 Low back pain, unspecified: Secondary | ICD-10-CM | POA: Insufficient documentation

## 2011-02-20 DIAGNOSIS — Z79899 Other long term (current) drug therapy: Secondary | ICD-10-CM | POA: Insufficient documentation

## 2011-03-04 ENCOUNTER — Emergency Department (HOSPITAL_BASED_OUTPATIENT_CLINIC_OR_DEPARTMENT_OTHER)
Admission: EM | Admit: 2011-03-04 | Discharge: 2011-03-04 | Disposition: A | Discharge status: Home / Self Care | Payer: 59 | Attending: Emergency Medicine | Admitting: Emergency Medicine

## 2011-03-04 DIAGNOSIS — H9209 Otalgia, unspecified ear: Secondary | ICD-10-CM | POA: Insufficient documentation

## 2011-03-04 DIAGNOSIS — H6690 Otitis media, unspecified, unspecified ear: Secondary | ICD-10-CM | POA: Insufficient documentation

## 2011-03-04 DIAGNOSIS — I10 Essential (primary) hypertension: Secondary | ICD-10-CM | POA: Insufficient documentation

## 2011-03-04 DIAGNOSIS — E119 Type 2 diabetes mellitus without complications: Secondary | ICD-10-CM | POA: Insufficient documentation

## 2011-03-04 DIAGNOSIS — J45909 Unspecified asthma, uncomplicated: Secondary | ICD-10-CM | POA: Insufficient documentation

## 2011-07-15 ENCOUNTER — Emergency Department (HOSPITAL_BASED_OUTPATIENT_CLINIC_OR_DEPARTMENT_OTHER)
Admission: EM | Admit: 2011-07-15 | Discharge: 2011-07-15 | Disposition: A | Discharge status: Home / Self Care | Payer: 59 | Attending: Emergency Medicine | Admitting: Emergency Medicine

## 2011-07-15 ENCOUNTER — Emergency Department (INDEPENDENT_AMBULATORY_CARE_PROVIDER_SITE_OTHER): Payer: 59

## 2011-07-15 DIAGNOSIS — Z1881 Retained glass fragments: Secondary | ICD-10-CM | POA: Insufficient documentation

## 2011-07-15 DIAGNOSIS — X500XXA Overexertion from strenuous movement or load, initial encounter: Secondary | ICD-10-CM

## 2011-07-15 DIAGNOSIS — M25579 Pain in unspecified ankle and joints of unspecified foot: Secondary | ICD-10-CM

## 2011-07-15 DIAGNOSIS — M795 Residual foreign body in soft tissue: Secondary | ICD-10-CM | POA: Insufficient documentation

## 2011-07-15 DIAGNOSIS — S90852A Superficial foreign body, left foot, initial encounter: Secondary | ICD-10-CM

## 2011-07-15 HISTORY — DX: Essential (primary) hypertension: I10

## 2011-07-15 MED ORDER — LIDOCAINE HCL 2 % IJ SOLN
INTRAMUSCULAR | Status: AC
  Administered 2011-07-15: 10:00:00
  Filled 2011-07-15: qty 1

## 2011-07-15 MED ORDER — CEPHALEXIN 500 MG PO CAPS: 500.0000 mg | ORAL_CAPSULE | Freq: Three times a day (TID) | ORAL | Status: AC

## 2011-07-15 NOTE — ED Provider Notes (Signed)
History     CSN: [id number] Arrival date & time: 07/15/2011  8:50 AM   First MD Initiated Contact with Patient 07/15/11 [phone]      Chief Complaint  Patient presents with  . Foot Injury  . Foreign Body    (Consider location/radiation/quality/duration/timing/severity/associated sxs/prior treatment) Patient is a 35 y.o. male presenting with foot injury and foreign body. The history is provided by the patient.  Foot Injury  The incident occurred yesterday. The incident occurred at home. Injury mechanism: stepped on unknown object.  Feels like still in foot. The pain is present in the left foot. The quality of the pain is described as sharp. The pain is moderate. The pain has been intermittent since onset. Associated symptoms include numbness. Possible foreign bodies include glass. The symptoms are aggravated by bearing weight and palpation. He has tried nothing for the symptoms.  Foreign Body     Past Medical History  Diagnosis Date  . Asthma   . Diabetes mellitus   . Hypertension     History reviewed. No pertinent past surgical history.  No family history on file.  History  Substance Use Topics  . Smoking status: Never Smoker   . Smokeless tobacco: Never Used  . Alcohol Use: No      Review of Systems  Constitutional: Negative.   Musculoskeletal: Negative.   Skin:       As above  Neurological: Positive for numbness.    Allergies  Slo-bid gyrocaps and Theophyllines  Home Medications   Current Outpatient Rx  Name Route Sig Dispense Refill  . IPRATROPIUM-ALBUTEROL 18-103 MCG/ACT IN AERO Inhalation Inhale 2 puffs into the lungs every 6 (six) hours as needed.     . ENALAPRIL MALEATE 10 MG PO TABS Oral Take 10 mg by mouth daily.      . GLYBURIDE-METFORMIN 5-500 MG PO TABS Oral Take 1 tablet by mouth 2 (two) times daily.        BP 145/91  Pulse 88  Temp(Src) 98.3 F (36.8 C) (Oral)  Resp 20  Ht 5\' 9"  (1.753 m)  Wt 350 lb (158.759 kg)  BMI 51.69 kg/m2  SpO2  99%  Physical Exam  Nursing note and vitals reviewed. Constitutional: He is oriented to person, place, and time. He appears well-developed and well-nourished.  HENT:  Head: Normocephalic and atraumatic.  Neck: Normal range of motion. Neck supple.  Neurological: He is alert and oriented to person, place, and time.  Skin:       There is a small puncture wound to the bottom of the left foot.      ED Course  FOREIGN BODY REMOVAL Date/Time: 07/15/2011 9:31 AM Performed by: Geoffery Lyons Authorized by: Geoffery Lyons Consent: Verbal consent obtained. Risks and benefits: risks, benefits and alternatives were discussed Consent given by: patient Patient understanding: patient states understanding of the procedure being performed Patient consent: the patient's understanding of the procedure matches consent given Procedure consent: procedure consent matches procedure scheduled Relevant documents: relevant documents present and verified Patient identity confirmed: verbally with patient Intake: left foot. Local anesthetic: lidocaine 1% without epinephrine Anesthetic total: 1 ml Patient sedated: no Patient restrained: no Patient cooperative: yes Complexity: simple 1 objects recovered. Objects recovered: shard of glass Post-procedure assessment: foreign body removed Patient tolerance: Patient tolerated the procedure well with no immediate complications.   (including critical care time)  Labs Reviewed - No data to display Dg Foot Complete Left  07/15/2011  *RADIOLOGY REPORT*  Clinical Data: Twisting injury to the  left foot.  LEFT FOOT - COMPLETE 3+ VIEW 07/15/2011:  Comparison: None.  Findings: No evidence of acute fracture or dislocation.  Well- preserved joint spaces.  Well-preserved bone mineral density. Minimal hallux valgus.  Dorsal soft tissue swelling.  No opaque foreign bodies.  Mild pes planus.  Enthesopathy at the insertion of the Achilles tendon on the posterior calcaneus.   IMPRESSION: No acute or significant abnormalities.  Original Report Authenticated By: Arnell Sieving, M.D.     No diagnosis found.    MDM          Geoffery Lyons, MD 07/15/11 [phone]

## 2011-07-15 NOTE — ED Notes (Signed)
Procedure complete and small piece of glass removed from left foot.  Wound care and dressing applied by EMT.  Pt tolerated well.

## 2011-07-15 NOTE — ED Notes (Signed)
Dr. Judd Lien at bedside for procedure.

## 2011-07-15 NOTE — ED Notes (Signed)
Injury to left foot and possible foreign body in foot.

## 2011-09-09 ENCOUNTER — Encounter (HOSPITAL_COMMUNITY): Payer: Self-pay | Admitting: *Deleted

## 2011-09-09 ENCOUNTER — Emergency Department (HOSPITAL_COMMUNITY): Payer: 59

## 2011-09-09 ENCOUNTER — Emergency Department (HOSPITAL_COMMUNITY)
Admission: EM | Admit: 2011-09-09 | Discharge: 2011-09-09 | Disposition: A | Discharge status: Home / Self Care | Payer: 59 | Attending: Emergency Medicine | Admitting: Emergency Medicine

## 2011-09-09 ENCOUNTER — Other Ambulatory Visit: Payer: Self-pay

## 2011-09-09 DIAGNOSIS — F411 Generalized anxiety disorder: Secondary | ICD-10-CM | POA: Insufficient documentation

## 2011-09-09 DIAGNOSIS — E119 Type 2 diabetes mellitus without complications: Secondary | ICD-10-CM | POA: Insufficient documentation

## 2011-09-09 DIAGNOSIS — I10 Essential (primary) hypertension: Secondary | ICD-10-CM | POA: Insufficient documentation

## 2011-09-09 DIAGNOSIS — H81399 Other peripheral vertigo, unspecified ear: Secondary | ICD-10-CM

## 2011-09-09 DIAGNOSIS — R42 Dizziness and giddiness: Secondary | ICD-10-CM | POA: Insufficient documentation

## 2011-09-09 DIAGNOSIS — J45909 Unspecified asthma, uncomplicated: Secondary | ICD-10-CM | POA: Insufficient documentation

## 2011-09-09 DIAGNOSIS — R55 Syncope and collapse: Secondary | ICD-10-CM | POA: Insufficient documentation

## 2011-09-09 LAB — GLUCOSE, CAPILLARY: Glucose-Capillary: 296 mg/dL — ABNORMAL HIGH (ref 70–99)

## 2011-09-09 LAB — BASIC METABOLIC PANEL
BUN: 11 mg/dL (ref 6–23)
CO2: 21 mEq/L (ref 19–32)
Calcium: 10.1 mg/dL (ref 8.4–10.5)
Chloride: 97 mEq/L (ref 96–112)
Creatinine, Ser: 0.54 mg/dL (ref 0.50–1.35)
GFR calc Af Amer: >90 mL/min (ref >90)
GFR calc non Af Amer: >90 mL/min (ref >90)
Glucose, Bld: 356 mg/dL — ABNORMAL HIGH (ref 70–99)
Potassium: 3.9 mEq/L (ref 3.5–5.1)
Sodium: 133 mEq/L — ABNORMAL LOW (ref 135–145)

## 2011-09-09 LAB — CBC
HCT: 45.7 % (ref 39.0–52.0)
Hemoglobin: 16 g/dL (ref 13.0–17.0)
MCH: 28.5 pg (ref 26.0–34.0)
MCHC: 35 g/dL (ref 30.0–36.0)
MCV: 81.5 fL (ref 78.0–100.0)
Platelets: 338 10*3/uL (ref 150–400)
RBC: 5.61 MIL/uL (ref 4.22–5.81)
RDW: 13.3 % (ref 11.5–15.5)
WBC: 11.1 10*3/uL — ABNORMAL HIGH (ref 4.0–10.5)

## 2011-09-09 LAB — DIFFERENTIAL
Basophils Absolute: 0.1 10*3/uL (ref 0.0–0.1)
Basophils Relative: 1 % (ref 0–1)
Eosinophils Absolute: 0.3 10*3/uL (ref 0.0–0.7)
Eosinophils Relative: 3 % (ref 0–5)
Lymphocytes Relative: 28 % (ref 12–46)
Lymphs Abs: 3 10*3/uL (ref 0.7–4.0)
Monocytes Absolute: 0.7 10*3/uL (ref 0.1–1.0)
Monocytes Relative: 7 % (ref 3–12)
Neutro Abs: 6.9 10*3/uL (ref 1.7–7.7)
Neutrophils Relative %: 63 % (ref 43–77)

## 2011-09-09 LAB — POCT I-STAT TROPONIN I: Troponin i, poc: 0 ng/mL (ref 0.00–0.08)

## 2011-09-09 MED ORDER — SODIUM CHLORIDE 0.9 % IV SOLN
INTRAVENOUS | Status: DC
  Administered 2011-09-09: 13:00:00 via INTRAVENOUS

## 2011-09-09 MED ORDER — MECLIZINE HCL 25 MG PO TABS: 25.0000 mg | ORAL_TABLET | Freq: Three times a day (TID) | ORAL | Status: AC | PRN

## 2011-09-09 MED ORDER — MECLIZINE HCL 25 MG PO TABS
50.0000 mg | ORAL_TABLET | Freq: Once | ORAL | Status: AC
  Administered 2011-09-09: 50 mg via ORAL
  Filled 2011-09-09: qty 2

## 2011-09-09 MED ORDER — SODIUM CHLORIDE 0.9 % IV BOLUS (SEPSIS)
500.0000 mL | Freq: Once | INTRAVENOUS | Status: AC
  Administered 2011-09-09: 500 mL via INTRAVENOUS

## 2011-09-09 NOTE — ED Notes (Signed)
cbg is 296

## 2011-09-09 NOTE — ED Notes (Signed)
Patient states he was walking down the hall on his way to work and started to feel bad after having lab work completed, [employer] employees saw him and brought him to the ED. Patient denies chest pain and denies SOB. Patient states he was up walking around and started feeling funny. Patient states he was on his way to the ED because his was feeling dizzy.

## 2011-09-09 NOTE — ED Notes (Addendum)
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 Patient states he ate breakfast this morning and did not take medications this morning, and states he usually does not take his medication until lunch.  Patient denies any chest pain or SOB or N/V. Patient is very upset about being sick and states he is feeling dizzy again. Patient states he does not heck his sugar on a regular basis and does not take his medication as instructed.

## 2011-09-09 NOTE — ED Notes (Signed)
Patient upset and asking for a doctor, Dr. Clarene Duke advised patient  She will see him asap after code stroke patient.

## 2011-09-09 NOTE — ED Notes (Signed)
Patient resting with wife at bedside. Patient talking with staff and wife with NAD at this time.

## 2011-09-09 NOTE — ED Notes (Signed)
Patient resting with NAD at this time. Family at bedside.  

## 2011-09-09 NOTE — ED Notes (Addendum)
Patient passed Screen [place] Test and has gone to CT. Wife waiting in room. Patient appears to have NAD at this time.

## 2011-09-09 NOTE — ED Provider Notes (Signed)
History     CSN: [id number] Arrival date & time: 09/09/2011 10:46 AM   Chief Complaint  Patient presents with  . Dizziness    HPI Pt was seen at 1130.  Per pt, c/o sudden onset and persistence of constant "dizziness" that began PTA.  Pt states he was walking down a hallway when he began to "felt funny" and "dizzy."  Pt describes the dizziness as a sense of movement, worsens with position change and head/eyes turning side to side.  Denies CP/palpitations, no SOB/cough, no abd pain, no N/V/D, no fevers, no headache, no visual changes, no focal motor weakness, no tingling/numbness in extremities.   Past Medical History  Diagnosis Date  . Asthma   . Diabetes mellitus   . Hypertension   . Anxiety     History reviewed. No pertinent past surgical history.  History  Substance Use Topics  . Smoking status: Never Smoker   . Smokeless tobacco: Never Used  . Alcohol Use: No    Review of Systems ROS: Statement: All systems negative except as marked or noted in the HPI; Constitutional: Negative for fever and chills. ; ; Eyes: Negative for eye pain, redness and discharge. ; ; ENMT: Negative for ear pain, hoarseness, nasal congestion, sinus pressure and sore throat. ; ; Cardiovascular: Negative for chest pain, palpitations, diaphoresis, dyspnea and peripheral edema. ; ; Respiratory: Negative for cough, wheezing and stridor. ; ; Gastrointestinal: Negative for nausea, vomiting, diarrhea, abdominal pain, blood in stool, hematemesis, jaundice and rectal bleeding. . ; ; Genitourinary: Negative for dysuria, flank pain and hematuria. ; ; Musculoskeletal: Negative for back pain and neck pain. Negative for swelling and trauma.; ; Skin: Negative for pruritus, rash, abrasions, blisters, bruising and skin lesion.; ; Neuro: +dizziness. Negative for headache, lightheadedness and neck stiffness. Negative for weakness, altered level of consciousness , altered mental status, extremity weakness, paresthesias,  involuntary movement, seizure and syncope.    Allergies  Slo-bid gyrocaps and Theophyllines  Home Medications   Current Outpatient Rx  Name Route Sig Dispense Refill  . ALBUTEROL SULFATE HFA 108 (90 BASE) MCG/ACT IN AERS Inhalation Inhale 2 puffs into the lungs every 6 (six) hours as needed. As needed for shortness of breath.     . ENALAPRIL MALEATE 10 MG PO TABS Oral Take 10 mg by mouth daily.     . GLYBURIDE-METFORMIN 5-500 MG PO TABS Oral Take 1 tablet by mouth 2 (two) times daily.     Marland Kitchen OVER THE COUNTER MEDICATION Oral Take 2 tablets by mouth at bedtime. Sleep aid.     Marland Kitchen MECLIZINE HCL 25 MG PO TABS Oral Take 1 tablet (25 mg total) by mouth 3 (three) times daily as needed. 15 tablet 0    BP 133/87  Pulse 86  Temp(Src) 98.7 F (37.1 C) (Oral)  Resp 20  SpO2 97%  Physical Exam 1135: Physical examination:  Nursing notes reviewed; Vital signs and O2 SAT reviewed;  Constitutional: Well developed, Well nourished, Well hydrated, In no acute distress, anxious; Head:  Normocephalic, atraumatic; Eyes: EOMI, PERRL, No scleral icterus; ENMT: Mouth and pharynx normal, Mucous membranes moist, +edemetous nasal turbinates bilat with clear rhinorrhea.; Neck: Supple, Full range of motion, No lymphadenopathy; Cardiovascular: Regular rate and rhythm, No murmur, rub, or gallop; Respiratory: Breath sounds clear & equal bilaterally, No rales, rhonchi, wheezes, or rub, Normal respiratory effort/excursion; Chest: Nontender, Movement normal; Abdomen: Soft, Nontender, Nondistended, Normal bowel sounds; Genitourinary: No CVA tenderness; Extremities: Pulses normal, No tenderness, No edema, No calf edema  or asymmetry.; Neuro: AA&Ox3, Major CN grossly intact.  Strength 5/5 equal bilat UE's and LE's.  DTR 2/4 equal bilat UE's and LE's.  No gross sensory deficits.  Normal cerebellar testing bilat UE's and LE's. Speech clear.  No facial droop.  +right horizontal gaze fatigable nystagmus which reproduces pt's symptoms.;  Skin: Color normal, Warm, Dry, no rash.   ED Course  Procedures   MDM  MDM Reviewed: nursing note and vitals Interpretation: ECG, labs, x-ray and CT scan    Date: 09/09/2011  Rate: 98  Rhythm: normal sinus rhythm  QRS Axis: right  Intervals: normal  ST/T Wave abnormalities: normal  Conduction Disutrbances:none  Narrative Interpretation:   Old EKG Reviewed: none available.  Results for orders placed during the hospital encounter of 09/09/11  CBC      Component Value Range   WBC 11.1 (*) 4.0 - 10.5 (K/uL)   RBC 5.61  4.22 - 5.81 (MIL/uL)   Hemoglobin 16.0  13.0 - 17.0 (g/dL)   HCT 16.1  09.6 - 04.5 (%)   MCV 81.5  78.0 - 100.0 (fL)   MCH 28.5  26.0 - 34.0 (pg)   MCHC 35.0  30.0 - 36.0 (g/dL)   RDW 40.9  81.1 - 91.4 (%)   Platelets 338  150 - 400 (K/uL)  DIFFERENTIAL      Component Value Range   Neutrophils Relative 63  43 - 77 (%)   Neutro Abs 6.9  1.7 - 7.7 (K/uL)   Lymphocytes Relative 28  12 - 46 (%)   Lymphs Abs 3.0  0.7 - 4.0 (K/uL)   Monocytes Relative 7  3 - 12 (%)   Monocytes Absolute 0.7  0.1 - 1.0 (K/uL)   Eosinophils Relative 3  0 - 5 (%)   Eosinophils Absolute 0.3  0.0 - 0.7 (K/uL)   Basophils Relative 1  0 - 1 (%)   Basophils Absolute 0.1  0.0 - 0.1 (K/uL)  BASIC METABOLIC PANEL      Component Value Range   Sodium 133 (*) 135 - 145 (mEq/L)   Potassium 3.9  3.5 - 5.1 (mEq/L)   Chloride 97  96 - 112 (mEq/L)   CO2 21  19 - 32 (mEq/L)   Glucose, Bld 356 (*) 70 - 99 (mg/dL)   BUN 11  6 - 23 (mg/dL)   Creatinine, Ser 7.82  0.50 - 1.35 (mg/dL)   Calcium 95.6  8.4 - 10.5 (mg/dL)   GFR calc non Af Amer >90  >90 (mL/min)   GFR calc Af Amer >90  >90 (mL/min)  GLUCOSE, CAPILLARY      Component Value Range   Glucose-Capillary 296 (*) 70 - 99 (mg/dL)   Comment 1 Notify RN    POCT I-STAT TROPONIN I      Component Value Range   Troponin i, poc 0.00  0.00 - 0.08 (ng/mL)   Comment 3           GLUCOSE, CAPILLARY      Component Value Range    Glucose-Capillary 283 (*) 70 - 99 (mg/dL)   Comment 1 Notify RN     Comment 2 Documented in Chart     Dg Chest 2 View  09/09/2011  *RADIOLOGY REPORT*  Clinical Data: Rule out infiltrate  CHEST - 2 VIEW  Comparison: 01/28/2007  Findings: Cardiomediastinal silhouette is stable.  No acute infiltrate or pleural effusion.  No pulmonary edema.  Bony thorax is stable.  IMPRESSION: No active disease.  No significant change.  Original Report Authenticated By: Natasha Mead, M.D.   Ct Head Wo Contrast  09/09/2011  *RADIOLOGY REPORT*  Clinical Data: Dizziness.  Presyncopal episode.  CT HEAD WITHOUT CONTRAST  Technique:  Contiguous axial images were obtained from the base of the skull through the vertex without contrast.  Comparison: None.  Findings: No acute intracranial abnormality is present. Specifically, there is no evidence for acute infarct, hemorrhage, mass, hydrocephalus, or extra-axial fluid collection.  The paranasal sinuses and mastoid air cells are clear.  The globes and orbits are intact.  The osseous skull is intact.  IMPRESSION: Negative CT of the head.  Original Report Authenticated By: Jamesetta Orleans. MATTERN, M.D.     8:51 PM:  Cal[place].  States he "feels better now" after the meds and wants to go home.  VSS, resps easy, gait steady.  CBG lower after IVF, not acidotic.  Pt endorses he has not taken any of his usual meds today.  Will d/c.  Dx testing d/w pt and family.  Questions answered.  Verb understanding, agreeable to d/c home with outpt f/u.         Latanya Hemmer Allison Quarry, DO 09/11/11 1047

## 2011-11-06 ENCOUNTER — Encounter (HOSPITAL_BASED_OUTPATIENT_CLINIC_OR_DEPARTMENT_OTHER): Payer: Self-pay | Admitting: Emergency Medicine

## 2011-11-06 ENCOUNTER — Emergency Department (HOSPITAL_BASED_OUTPATIENT_CLINIC_OR_DEPARTMENT_OTHER)
Admission: EM | Admit: 2011-11-06 | Discharge: 2011-11-06 | Disposition: A | Discharge status: Home / Self Care | Payer: 59 | Attending: Emergency Medicine | Admitting: Emergency Medicine

## 2011-11-06 ENCOUNTER — Emergency Department (INDEPENDENT_AMBULATORY_CARE_PROVIDER_SITE_OTHER): Payer: 59

## 2011-11-06 ENCOUNTER — Other Ambulatory Visit: Payer: Self-pay

## 2011-11-06 DIAGNOSIS — E119 Type 2 diabetes mellitus without complications: Secondary | ICD-10-CM | POA: Insufficient documentation

## 2011-11-06 DIAGNOSIS — R42 Dizziness and giddiness: Secondary | ICD-10-CM

## 2011-11-06 DIAGNOSIS — I10 Essential (primary) hypertension: Secondary | ICD-10-CM | POA: Insufficient documentation

## 2011-11-06 DIAGNOSIS — R079 Chest pain, unspecified: Secondary | ICD-10-CM

## 2011-11-06 DIAGNOSIS — R739 Hyperglycemia, unspecified: Secondary | ICD-10-CM

## 2011-11-06 DIAGNOSIS — J45909 Unspecified asthma, uncomplicated: Secondary | ICD-10-CM | POA: Insufficient documentation

## 2011-11-06 DIAGNOSIS — R109 Unspecified abdominal pain: Secondary | ICD-10-CM

## 2011-11-06 HISTORY — DX: Dizziness and giddiness: R42

## 2011-11-06 LAB — DIFFERENTIAL
Basophils Absolute: 0.1 K/uL (ref 0.0–0.1)
Basophils Relative: 1 % (ref 0–1)
Eosinophils Absolute: 0.2 K/uL (ref 0.0–0.7)
Eosinophils Relative: 2 % (ref 0–5)
Lymphocytes Relative: 25 % (ref 12–46)
Lymphs Abs: 2.5 K/uL (ref 0.7–4.0)
Monocytes Absolute: 0.6 K/uL (ref 0.1–1.0)
Monocytes Relative: 6 % (ref 3–12)
Neutro Abs: 6.8 K/uL (ref 1.7–7.7)
Neutrophils Relative %: 67 % (ref 43–77)

## 2011-11-06 LAB — CARDIAC PANEL(CRET KIN+CKTOT+MB+TROPI)
CK, MB: 1.6 ng/mL (ref 0.3–4.0)
Relative Index: INVALID (ref 0.0–2.5)
Total CK: 72 U/L (ref 7–232)
Troponin I: <0.3 ng/mL

## 2011-11-06 LAB — URINALYSIS, ROUTINE W REFLEX MICROSCOPIC
Bilirubin Urine: NEGATIVE
Glucose, UA: >1000 mg/dL — AB
Hgb urine dipstick: NEGATIVE
Ketones, ur: 15 mg/dL — AB
Leukocytes, UA: NEGATIVE
Nitrite: NEGATIVE
Protein, ur: 100 mg/dL — AB
Specific Gravity, Urine: 1.041 — ABNORMAL HIGH (ref 1.005–1.030)
Urobilinogen, UA: 0.2 mg/dL (ref 0.0–1.0)
pH: 7 (ref 5.0–8.0)

## 2011-11-06 LAB — COMPREHENSIVE METABOLIC PANEL
ALT: 31 U/L (ref 0–53)
AST: 19 U/L (ref 0–37)
Calcium: 10.2 mg/dL (ref 8.4–10.5)
Sodium: 137 mEq/L (ref 135–145)
Total Protein: 7.9 g/dL (ref 6.0–8.3)

## 2011-11-06 LAB — URINE MICROSCOPIC-ADD ON

## 2011-11-06 LAB — CBC
MCH: 27.3 pg (ref 26.0–34.0)
MCHC: 34.5 g/dL (ref 30.0–36.0)
MCV: 79.1 fL (ref 78.0–100.0)
Platelets: 378 10*3/uL (ref 150–400)
RDW: 13.3 % (ref 11.5–15.5)
WBC: 10.2 10*3/uL (ref 4.0–10.5)

## 2011-11-06 NOTE — ED Provider Notes (Signed)
History     CSN: [id number]  Arrival date & time 11/06/11  1340   First MD Initiated Contact with Patient 11/06/11 1501      Chief Complaint  Patient presents with  . Dizziness  . Abdominal Pain    (Consider location/radiation/quality/duration/timing/severity/associated sxs/prior treatment) Patient is a 36 y.o. male presenting with abdominal pain.  Abdominal Pain The primary symptoms of the illness include abdominal pain.    Patient had episode of vertigo- feels like I'm off balance, and nauseated.  Patient has had two prior episodes and was diagnosed with vertigo.  He was treated with antivert.  Patient took antivert today about 1100.  Vertigo is improved.  Symptoms of vertigo began about 1030.  Patient began having upper abdominal discomfort about 1230.  This has resolved.  Patient took antacid.  Patient had pressure in upper abdomen associated with nausea.  PMD Dr. Tyson Dense.    Past Medical History  Diagnosis Date  . Asthma   . Diabetes mellitus   . Hypertension   . Anxiety   . Vertigo     History reviewed. No pertinent past surgical history.  No family history on file.  History  Substance Use Topics  . Smoking status: Never Smoker   . Smokeless tobacco: Never Used  . Alcohol Use: No      Review of Systems  Constitutional: Negative.   HENT: Negative.   Eyes: Negative.   Respiratory: Negative.   Gastrointestinal: Positive for abdominal pain.  Genitourinary: Negative.   Musculoskeletal: Negative.   Neurological: Positive for dizziness. Negative for tremors, seizures, syncope, facial asymmetry, speech difficulty, weakness, light-headedness, numbness and headaches.  Hematological: Negative.   Psychiatric/Behavioral: Negative.     Allergies  Slo-bid gyrocaps and Theophyllines  Home Medications   Current Outpatient Rx  Name Route Sig Dispense Refill  . ANTIVERT PO Oral Take by mouth as needed.    . ALBUTEROL SULFATE HFA 108 (90 BASE) MCG/ACT IN AERS  Inhalation Inhale 2 puffs into the lungs every 6 (six) hours as needed. As needed for shortness of breath.     . ENALAPRIL MALEATE 10 MG PO TABS Oral Take 10 mg by mouth daily.     . GLYBURIDE-METFORMIN 5-500 MG PO TABS Oral Take 1 tablet by mouth 2 (two) times daily.     Marland Kitchen OVER THE COUNTER MEDICATION Oral Take 2 tablets by mouth at bedtime. Sleep aid.       BP 162/82  Pulse 96  Temp(Src) 98.9 F (37.2 C) (Oral)  Resp 20  SpO2 99%  Physical Exam  Nursing note and vitals reviewed. Constitutional: He is oriented to person, place, and time. He appears well-developed and well-nourished.       Morbidly obese  HENT:  Head: Normocephalic and atraumatic.  Eyes: Conjunctivae and EOM are normal. Pupils are equal, round, and reactive to light.  Neck: Normal range of motion. Neck supple.  Cardiovascular: Normal rate and regular rhythm.   Pulmonary/Chest: Effort normal and breath sounds normal.  Abdominal: Soft. Bowel sounds are normal.  Musculoskeletal: Normal range of motion.  Neurological: He is alert and oriented to person, place, and time. He has normal reflexes.  Skin: Skin is warm and dry.  Psychiatric: He has a normal mood and affect.    ED Course  Procedures (including critical care time)  Labs Reviewed  URINALYSIS, ROUTINE W REFLEX MICROSCOPIC - Abnormal; Notable for the following:    Specific Gravity, Urine 1.041 (*)    Glucose, UA >1000 (*)  Ketones, ur 15 (*)    Protein, ur 100 (*)    All other components within normal limits  URINE MICROSCOPIC-ADD ON   No results found.   No diagnosis found.    Date: 11/06/2011  Rate: 91  Rhythm: normal sinus rhythm  QRS Axis: normal, borderline right  Intervals: normal  ST/T Wave abnormalities: normal  Conduction Disutrbances:none  Narrative Interpretation:   Old EKG Reviewed: unchanged Results for orders placed during the hospital encounter of 11/06/11  URINALYSIS, ROUTINE W REFLEX MICROSCOPIC      Component Value Range    Color, Urine YELLOW  YELLOW    APPearance CLEAR  CLEAR    Specific Gravity, Urine 1.041 (*) 1.005 - 1.030    pH 7.0  5.0 - 8.0    Glucose, UA >1000 (*) NEGATIVE (mg/dL)   Hgb urine dipstick NEGATIVE  NEGATIVE    Bilirubin Urine NEGATIVE  NEGATIVE    Ketones, ur 15 (*) NEGATIVE (mg/dL)   Protein, ur 161 (*) NEGATIVE (mg/dL)   Urobilinogen, UA 0.2  0.0 - 1.0 (mg/dL)   Nitrite NEGATIVE  NEGATIVE    Leukocytes, UA NEGATIVE  NEGATIVE   URINE MICROSCOPIC-ADD ON      Component Value Range   Squamous Epithelial / LPF RARE  RARE    WBC, UA 0-2  <3 (WBC/hpf)  CBC      Component Value Range   WBC 10.2  4.0 - 10.5 (K/uL)   RBC 5.65  4.22 - 5.81 (MIL/uL)   Hemoglobin 15.4  13.0 - 17.0 (g/dL)   HCT 09.6  04.5 - 40.9 (%)   MCV 79.1  78.0 - 100.0 (fL)   MCH 27.3  26.0 - 34.0 (pg)   MCHC 34.5  30.0 - 36.0 (g/dL)   RDW 81.1  91.4 - 78.2 (%)   Platelets 378  150 - 400 (K/uL)  DIFFERENTIAL      Component Value Range   Neutrophils Relative 67  43 - 77 (%)   Neutro Abs 6.8  1.7 - 7.7 (K/uL)   Lymphocytes Relative 25  12 - 46 (%)   Lymphs Abs 2.5  0.7 - 4.0 (K/uL)   Monocytes Relative 6  3 - 12 (%)   Monocytes Absolute 0.6  0.1 - 1.0 (K/uL)   Eosinophils Relative 2  0 - 5 (%)   Eosinophils Absolute 0.2  0.0 - 0.7 (K/uL)   Basophils Relative 1  0 - 1 (%)   Basophils Absolute 0.1  0.0 - 0.1 (K/uL)  COMPREHENSIVE METABOLIC PANEL      Component Value Range   Sodium 137  135 - 145 (mEq/L)   Potassium 4.0  3.5 - 5.1 (mEq/L)   Chloride 101  96 - 112 (mEq/L)   CO2 24  19 - 32 (mEq/L)   Glucose, Bld 271 (*) 70 - 99 (mg/dL)   BUN 9  6 - 23 (mg/dL)   Creatinine, Ser 9.56  0.50 - 1.35 (mg/dL)   Calcium 21.3  8.4 - 10.5 (mg/dL)   Total Protein 7.9  6.0 - 8.3 (g/dL)   Albumin 3.8  3.5 - 5.2 (g/dL)   AST 19  0 - 37 (U/L)   ALT 31  0 - 53 (U/L)   Alkaline Phosphatase 115  39 - 117 (U/L)   Total Bilirubin 0.5  0.3 - 1.2 (mg/dL)   GFR calc non Af Amer >90  >90 (mL/min)   GFR calc Af Amer >90  >90  (mL/min)  CARDIAC PANEL(CRET KIN+CKTOT+MB+TROPI)  Component Value Range   Total CK 72  7 - 232 (U/L)   CK, MB 1.6  0.3 - 4.0 (ng/mL)   Troponin I <0.30  <0.30 (ng/mL)   Relative Index RELATIVE INDEX IS INVALID  0.0 - 2.5      MDM        Hilario Quarry, MD 11/07/11 2348

## 2011-11-06 NOTE — ED Notes (Signed)
Pt reports having an episode of vertigo today at work (11am), accompanied by LUQ pain

## 2011-11-06 NOTE — Discharge Instructions (Signed)

## 2012-11-06 ENCOUNTER — Emergency Department (HOSPITAL_BASED_OUTPATIENT_CLINIC_OR_DEPARTMENT_OTHER)
Admission: EM | Admit: 2012-11-06 | Discharge: 2012-11-06 | Disposition: A | Discharge status: Home / Self Care | Payer: Self-pay | Attending: Emergency Medicine | Admitting: Emergency Medicine

## 2012-11-06 ENCOUNTER — Encounter (HOSPITAL_BASED_OUTPATIENT_CLINIC_OR_DEPARTMENT_OTHER): Payer: Self-pay | Admitting: *Deleted

## 2012-11-06 DIAGNOSIS — Z8659 Personal history of other mental and behavioral disorders: Secondary | ICD-10-CM | POA: Insufficient documentation

## 2012-11-06 DIAGNOSIS — E119 Type 2 diabetes mellitus without complications: Secondary | ICD-10-CM | POA: Insufficient documentation

## 2012-11-06 DIAGNOSIS — I10 Essential (primary) hypertension: Secondary | ICD-10-CM | POA: Insufficient documentation

## 2012-11-06 DIAGNOSIS — M545 Low back pain, unspecified: Secondary | ICD-10-CM | POA: Insufficient documentation

## 2012-11-06 DIAGNOSIS — Z79899 Other long term (current) drug therapy: Secondary | ICD-10-CM | POA: Insufficient documentation

## 2012-11-06 DIAGNOSIS — M549 Dorsalgia, unspecified: Secondary | ICD-10-CM

## 2012-11-06 DIAGNOSIS — Z8669 Personal history of other diseases of the nervous system and sense organs: Secondary | ICD-10-CM | POA: Insufficient documentation

## 2012-11-06 DIAGNOSIS — J45909 Unspecified asthma, uncomplicated: Secondary | ICD-10-CM | POA: Insufficient documentation

## 2012-11-06 MED ORDER — CYCLOBENZAPRINE HCL 10 MG PO TABS: 10.0000 mg | ORAL_TABLET | Freq: Two times a day (BID) | ORAL | Status: DC | PRN

## 2012-11-06 MED ORDER — HYDROCODONE-ACETAMINOPHEN 5-325 MG PO TABS: 1.0000 | ORAL_TABLET | ORAL | Status: DC | PRN

## 2012-11-06 NOTE — ED Notes (Signed)
Patient states he woke up yesterday with pain in his left lower back.  Denies radiation or any known injury.

## 2012-11-06 NOTE — ED Provider Notes (Signed)
History     CSN: [id number]  Arrival date & time 11/06/12  1240   None     Chief Complaint  Patient presents with  . Back Pain     HPI Michael Foster is a 37 y.o. male who presents to the ED with back pain. This is a recurrent problem. Last time he had back pain was several months ago.  The pain is located in the lower back. The onset was sudden. Woke with the pain yesterday but at that time the pain was dull. Took Excedrin for pain but did not get much relief. Today the pain is much worse. He rates the pain as 7/10. No known injury. Patient states he knows that his weight plays a part in his back pain but he has lost about 60 pound in the past year.  The history was provided by the patient.    Past Medical History  Diagnosis Date  . Asthma   . Diabetes mellitus   . Hypertension   . Anxiety   . Vertigo     History reviewed. No pertinent past surgical history.  No family history on file.  History  Substance Use Topics  . Smoking status: Never Smoker   . Smokeless tobacco: Never Used  . Alcohol Use: No      Review of Systems  Constitutional: Positive for activity change (working out more often). Negative for fever, chills, diaphoresis and fatigue.  HENT: Negative for ear pain, congestion, sore throat, facial swelling, neck pain, neck stiffness, dental problem and sinus pressure.   Eyes: Negative for photophobia, pain and discharge.  Respiratory: Negative for cough, chest tightness and wheezing.   Cardiovascular: Negative for chest pain and palpitations.  Gastrointestinal: Negative for nausea, vomiting, abdominal pain, diarrhea, constipation and abdominal distention.  Genitourinary: Negative for dysuria, frequency, flank pain and difficulty urinating.  Musculoskeletal: Positive for back pain. Negative for myalgias and gait problem.  Skin: Negative for color change and rash.  Neurological: Negative for dizziness, speech difficulty, weakness, light-headedness, numbness and  headaches.  Psychiatric/Behavioral: Negative for confusion and agitation. The patient is not nervous/anxious.     Allergies  Slo-bid gyrocaps and Theophyllines  Home Medications   Current Outpatient Rx  Name  Route  Sig  Dispense  Refill  . albuterol (PROVENTIL HFA;VENTOLIN HFA) 108 (90 BASE) MCG/ACT inhaler   Inhalation   Inhale 2 puffs into the lungs every 6 (six) hours as needed. As needed for shortness of breath.          . enalapril (VASOTEC) 10 MG tablet   Oral   Take 10 mg by mouth daily.          Marland Kitchen glyBURIDE-metformin (GLUCOVANCE) 5-500 MG per tablet   Oral   Take 1 tablet by mouth 2 (two) times daily.          . meclizine (ANTIVERT) 25 MG tablet   Oral   Take 25 mg by mouth 3 (three) times daily as needed. For dizziness           BP 146/82  Temp(Src) 98.2 F (36.8 C) (Oral)  Ht 5\' 9"  (1.753 m)  Wt 310 lb (140.615 kg)  BMI 45.76 kg/m2  SpO2 96%  Physical Exam  Nursing note and vitals reviewed. Constitutional: He is oriented to person, place, and time. No distress.  Morbidly obese  HENT:  Head: Normocephalic and atraumatic.  Eyes: EOM are normal. Pupils are equal, round, and reactive to light.  Neck: Neck supple.  Cardiovascular: Normal rate and regular rhythm.   Pulmonary/Chest: Effort normal. No respiratory distress. He has no wheezes.  Abdominal: Soft. There is no tenderness.  Musculoskeletal:  Limited ROM lower back due to discomfort. Tender with palpation left lumbar area. Pain increases with bending forward. Pain at 45 degrees with straight leg raise left. Pedal pulses present. Adequate circulation. Reflexes equal bilateral. Ambulatory without foot drag.  Neurological: He is alert and oriented to person, place, and time. He has normal strength and normal reflexes. No cranial nerve deficit or sensory deficit.  Skin: Skin is warm and dry.  Psychiatric: He has a normal mood and affect.   Procedures  Assessment: 37 y.o. male with low back  pain  Plan:  Ibuprofen OTC   Flexeril   Hydrocodone Discussed with the patient and all questioned fully answered. He will return if any problems arise.    Medication List    TAKE these medications       cyclobenzaprine 10 MG tablet  Commonly known as:  FLEXERIL  Take 1 tablet (10 mg total) by mouth 2 (two) times daily as needed for muscle spasms.     HYDROcodone-acetaminophen 5-325 MG per tablet  Commonly known as:  NORCO/VICODIN  Take 1 tablet by mouth every 4 (four) hours as needed for pain.      ASK your doctor about these medications       albuterol 108 (90 BASE) MCG/ACT inhaler  Commonly known as:  PROVENTIL HFA;VENTOLIN HFA  Inhale 2 puffs into the lungs every 6 (six) hours as needed. As needed for shortness of breath.     enalapril 10 MG tablet  Commonly known as:  VASOTEC  Take 10 mg by mouth daily.     glyBURIDE-metformin 5-500 MG per tablet  Commonly known as:  GLUCOVANCE  Take 1 tablet by mouth 2 (two) times daily.     meclizine 25 MG tablet  Commonly known as:  ANTIVERT  Take 25 mg by mouth 3 (three) times daily as needed. For dizziness           Janne Napoleon, NP 11/06/12 1337

## 2012-11-07 NOTE — ED Provider Notes (Signed)
Medical screening examination/treatment/procedure(s) were performed by non-physician practitioner and as supervising physician I was immediately available for consultation/collaboration.  Geoffery Lyons, MD 11/07/12 [phone]

## 2013-01-02 ENCOUNTER — Encounter (HOSPITAL_BASED_OUTPATIENT_CLINIC_OR_DEPARTMENT_OTHER): Payer: Self-pay | Admitting: *Deleted

## 2013-01-02 ENCOUNTER — Emergency Department (HOSPITAL_BASED_OUTPATIENT_CLINIC_OR_DEPARTMENT_OTHER)
Admission: EM | Admit: 2013-01-02 | Discharge: 2013-01-02 | Disposition: A | Discharge status: Home / Self Care | Payer: Self-pay | Attending: Emergency Medicine | Admitting: Emergency Medicine

## 2013-01-02 ENCOUNTER — Emergency Department (HOSPITAL_BASED_OUTPATIENT_CLINIC_OR_DEPARTMENT_OTHER): Payer: Self-pay

## 2013-01-02 DIAGNOSIS — Z79899 Other long term (current) drug therapy: Secondary | ICD-10-CM | POA: Insufficient documentation

## 2013-01-02 DIAGNOSIS — E119 Type 2 diabetes mellitus without complications: Secondary | ICD-10-CM | POA: Insufficient documentation

## 2013-01-02 DIAGNOSIS — I10 Essential (primary) hypertension: Secondary | ICD-10-CM | POA: Insufficient documentation

## 2013-01-02 DIAGNOSIS — J3489 Other specified disorders of nose and nasal sinuses: Secondary | ICD-10-CM | POA: Insufficient documentation

## 2013-01-02 DIAGNOSIS — Z8659 Personal history of other mental and behavioral disorders: Secondary | ICD-10-CM | POA: Insufficient documentation

## 2013-01-02 DIAGNOSIS — R42 Dizziness and giddiness: Secondary | ICD-10-CM | POA: Insufficient documentation

## 2013-01-02 DIAGNOSIS — J45901 Unspecified asthma with (acute) exacerbation: Secondary | ICD-10-CM | POA: Insufficient documentation

## 2013-01-02 MED ORDER — ALBUTEROL SULFATE HFA 108 (90 BASE) MCG/ACT IN AERS: 1.0000 | INHALATION_SPRAY | Freq: Four times a day (QID) | RESPIRATORY_TRACT | Status: DC | PRN

## 2013-01-02 MED ORDER — PREDNISONE 20 MG PO TABS: ORAL_TABLET | ORAL | Status: DC

## 2013-01-02 MED ORDER — PREDNISONE 50 MG PO TABS
60.0000 mg | ORAL_TABLET | Freq: Once | ORAL | Status: AC
  Administered 2013-01-02: 60 mg via ORAL
  Filled 2013-01-02: qty 1

## 2013-01-02 MED ORDER — ALBUTEROL SULFATE (5 MG/ML) 0.5% IN NEBU
5.0000 mg | INHALATION_SOLUTION | Freq: Once | RESPIRATORY_TRACT | Status: AC
  Administered 2013-01-02: 5 mg via RESPIRATORY_TRACT
  Filled 2013-01-02: qty 1

## 2013-01-02 MED ORDER — ALBUTEROL SULFATE HFA 108 (90 BASE) MCG/ACT IN AERS
2.0000 | INHALATION_SPRAY | Freq: Four times a day (QID) | RESPIRATORY_TRACT | Status: DC
  Administered 2013-01-02: 2 via RESPIRATORY_TRACT
  Filled 2013-01-02 (×2): qty 6.7

## 2013-01-02 NOTE — ED Notes (Signed)
Pt c/o shortness of breath, cold sxs, and asthma flare up. Denies fever.

## 2013-01-02 NOTE — ED Provider Notes (Signed)
History  This chart was scribed for Michael Manning Smitty Cords, MD by Shari Heritage, ED Scribe. The patient was seen in room MH02/MH02. Patient's care was started at 0115.   CSN: [id number]  Arrival date & time 01/02/13  0115    Chief Complaint  Patient presents with  . Asthma    Patient is a 37 y.o. male presenting with asthma. The history is provided by the patient. No language interpreter was used.  Asthma This is a chronic problem. Episode onset: several hours ago. The problem occurs constantly. The problem has not changed since onset.Associated symptoms include shortness of breath. Pertinent negatives include no chest pain, no abdominal pain and no headaches.    HPI Comments: Michael Foster is a 37 y.o. male with history of asthma who presents to the Emergency Department complaining of moderate, persistent shortness of breath and wheezing onset several hours ago. There is associated congestion and rhinorrhea. Patient says that he ran out of his inhaler so he came to the ED for a breathing treatment. Patient denies fever, chest pain, cough, sore throat, headaches, back pain, neck pain, neck stiffness, visual changes, abdominal pain, nausea, vomiting, diarrhea, constipation, dysuria, difficulty urinating, frequency, urgency or rash. Patient's other medical history includes diabetes, hypertension and anxiety   Past Medical History  Diagnosis Date  . Asthma   . Diabetes mellitus   . Hypertension   . Anxiety   . Vertigo     History reviewed. No pertinent past surgical history.  History reviewed. No pertinent family history.  History  Substance Use Topics  . Smoking status: Never Smoker   . Smokeless tobacco: Never Used  . Alcohol Use: No      Review of Systems  Constitutional: Negative for fever and chills.  HENT: Positive for congestion and rhinorrhea. Negative for sore throat.   Eyes: Negative for visual disturbance.  Respiratory: Positive for shortness of breath and  wheezing. Negative for cough.   Cardiovascular: Negative for chest pain, palpitations and leg swelling.  Gastrointestinal: Negative for nausea, vomiting, abdominal pain, diarrhea and constipation.  Genitourinary: Negative for dysuria, urgency, frequency, hematuria and difficulty urinating.  Musculoskeletal: Negative for back pain.  Neurological: Negative for headaches.  All other systems reviewed and are negative.    Allergies  Slo-bid gyrocaps and Theophyllines  Home Medications   Current Outpatient Rx  Name  Route  Sig  Dispense  Refill  . albuterol (PROVENTIL HFA;VENTOLIN HFA) 108 (90 BASE) MCG/ACT inhaler   Inhalation   Inhale 2 puffs into the lungs every 6 (six) hours as needed. As needed for shortness of breath.          Marland Kitchen Ephedrine-Guaifenesin 12.5-200 MG TABS   Oral   Take by mouth.         . cyclobenzaprine (FLEXERIL) 10 MG tablet   Oral   Take 1 tablet (10 mg total) by mouth 2 (two) times daily as needed for muscle spasms.   15 tablet   0   . enalapril (VASOTEC) 10 MG tablet   Oral   Take 10 mg by mouth daily.          Marland Kitchen glyBURIDE-metformin (GLUCOVANCE) 5-500 MG per tablet   Oral   Take 1 tablet by mouth 2 (two) times daily.          Marland Kitchen HYDROcodone-acetaminophen (NORCO/VICODIN) 5-325 MG per tablet   Oral   Take 1 tablet by mouth every 4 (four) hours as needed for pain.   20 tablet  0   . meclizine (ANTIVERT) 25 MG tablet   Oral   Take 25 mg by mouth 3 (three) times daily as needed. For dizziness           Triage Vitals: BP 182/94  Pulse 90  Temp(Src) 98.5 F (36.9 C) (Oral)  Resp 22  Ht 5\' 9"  (1.753 m)  Wt 312 lb (141.522 kg)  BMI 46.05 kg/m2  SpO2 98%  Physical Exam  Constitutional: He is oriented to person, place, and time. He appears well-developed and well-nourished. No distress.  HENT:  Head: Normocephalic and atraumatic.  Mouth/Throat: Oropharynx is clear and moist and mucous membranes are normal. Mucous membranes are not dry.  No oropharyngeal exudate.  Eyes: Conjunctivae and EOM are normal. Pupils are equal, round, and reactive to light.  Neck: Normal range of motion. Neck supple. No tracheal deviation present.  Cardiovascular: Normal rate, regular rhythm and normal heart sounds.   No murmur heard. Pulmonary/Chest: Effort normal. No stridor. No respiratory distress. He has wheezes (expiratory).  Somewhat diminished breath sounds.  Abdominal: Soft. Bowel sounds are normal. He exhibits no distension and no mass. There is no tenderness. There is no rebound and no guarding.  Musculoskeletal: Normal range of motion.  Neurological: He is alert and oriented to person, place, and time.  Skin: Skin is warm and dry. No rash noted.  Psychiatric: He has a normal mood and affect. His behavior is normal.    ED Course  Procedures (including critical care time) DIAGNOSTIC STUDIES: Oxygen Saturation is 98% on room air, normal by my interpretation.    COORDINATION OF CARE: 1:45 AM- Patient informed of current plan for treatment and evaluation and agrees with plan at this time.    Labs Reviewed - No data to display   Dg Chest 2 View  01/02/2013  *RADIOLOGY REPORT*  Clinical Data: 37 year old male shortness of breath wheezing.  CHEST - 2 VIEW  Comparison: 11/06/2011 and earlier.  Findings: Lung volumes are stable and within normal limits. Cardiac size and mediastinal contours are within normal limits. Visualized tracheal air column is within normal limits.  Interval decreased interstitial opacity.  No pneumothorax, pulmonary edema, pleural effusion or confluent pulmonary opacity. No acute osseous abnormality identified.  IMPRESSION: No acute cardiopulmonary abnormality.   Original Report Authenticated By: Erskine Speed, M.D.      No diagnosis found.    MDM  Asthma attack, sx resolved post neb treatment.  will provide inhaler and rx for same and rx for prednisone.  Follow up with your family doctor for ongoing care.  Return  for worsening symptoms.        I personally performed the services described in this documentation, which was scribed in my presence. The recorded information has been reviewed and is accurate.     Jasmine Awe, MD 01/02/13 [phone]

## 2013-01-13 ENCOUNTER — Encounter (HOSPITAL_BASED_OUTPATIENT_CLINIC_OR_DEPARTMENT_OTHER): Payer: Self-pay | Admitting: *Deleted

## 2013-01-13 DIAGNOSIS — K602 Anal fissure, unspecified: Secondary | ICD-10-CM | POA: Insufficient documentation

## 2013-01-13 DIAGNOSIS — E119 Type 2 diabetes mellitus without complications: Secondary | ICD-10-CM | POA: Insufficient documentation

## 2013-01-13 DIAGNOSIS — Z79899 Other long term (current) drug therapy: Secondary | ICD-10-CM | POA: Insufficient documentation

## 2013-01-13 DIAGNOSIS — F411 Generalized anxiety disorder: Secondary | ICD-10-CM | POA: Insufficient documentation

## 2013-01-13 DIAGNOSIS — I10 Essential (primary) hypertension: Secondary | ICD-10-CM | POA: Insufficient documentation

## 2013-01-13 DIAGNOSIS — J45909 Unspecified asthma, uncomplicated: Secondary | ICD-10-CM | POA: Insufficient documentation

## 2013-01-13 NOTE — ED Notes (Signed)
Pt c/o rectal pain / burning x 2 weeks

## 2013-01-14 ENCOUNTER — Emergency Department (HOSPITAL_BASED_OUTPATIENT_CLINIC_OR_DEPARTMENT_OTHER)
Admission: EM | Admit: 2013-01-14 | Discharge: 2013-01-14 | Disposition: A | Discharge status: Home / Self Care | Payer: Self-pay | Attending: Emergency Medicine | Admitting: Emergency Medicine

## 2013-01-14 DIAGNOSIS — K602 Anal fissure, unspecified: Secondary | ICD-10-CM

## 2013-01-14 MED ORDER — LIDOCAINE (ANORECTAL) 5 % EX GEL: 1.0000 "application " | CUTANEOUS | Status: DC | PRN

## 2013-01-14 MED ORDER — LIDOCAINE HCL 4 % EX SOLN
CUTANEOUS | Status: DC | PRN
  Administered 2013-01-14: 50 mL via TOPICAL
  Filled 2013-01-14: qty 50

## 2013-01-14 NOTE — ED Notes (Signed)
MD at bedside. 

## 2013-01-14 NOTE — ED Provider Notes (Addendum)
History     CSN: [id number]  Arrival date & time 01/13/13  2128   First MD Initiated Contact with Patient 01/14/13 0208      Chief Complaint  Patient presents with  . Rectal Pain    (Consider location/radiation/quality/duration/timing/severity/associated sxs/prior treatment) HPI This is a 37 year old male with about a one-month history of rectal pain. He describes the pain as sharp and knifelike. It is exacerbated by sitting and especially by moving his bowels. The pain became severe yesterday to the point he had difficulty moving his bowels for fear of the pain. He has no history of pararectal disease in the past. He states he noticed a small tag of tissue at his anus. He has had occasional bleeding on the toilet tissue when wiping.  Past Medical History  Diagnosis Date  . Asthma   . Diabetes mellitus   . Hypertension   . Anxiety   . Vertigo     History reviewed. No pertinent past surgical history.  History reviewed. No pertinent family history.  History  Substance Use Topics  . Smoking status: Never Smoker   . Smokeless tobacco: Never Used  . Alcohol Use: No      Review of Systems  All other systems reviewed and are negative.    Allergies  Slo-bid gyrocaps and Theophyllines  Home Medications   Current Outpatient Rx  Name  Route  Sig  Dispense  Refill  . albuterol (PROVENTIL HFA;VENTOLIN HFA) 108 (90 BASE) MCG/ACT inhaler   Inhalation   Inhale 2 puffs into the lungs every 6 (six) hours as needed. As needed for shortness of breath.          Marland Kitchen albuterol (PROVENTIL HFA;VENTOLIN HFA) 108 (90 BASE) MCG/ACT inhaler   Inhalation   Inhale 1-2 puffs into the lungs every 6 (six) hours as needed for wheezing.   1 Inhaler   0   . cyclobenzaprine (FLEXERIL) 10 MG tablet   Oral   Take 1 tablet (10 mg total) by mouth 2 (two) times daily as needed for muscle spasms.   15 tablet   0   . enalapril (VASOTEC) 10 MG tablet   Oral   Take 10 mg by mouth daily.          Marland Kitchen Ephedrine-Guaifenesin 12.5-200 MG TABS   Oral   Take by mouth.         . glyBURIDE-metformin (GLUCOVANCE) 5-500 MG per tablet   Oral   Take 1 tablet by mouth 2 (two) times daily.          Marland Kitchen HYDROcodone-acetaminophen (NORCO/VICODIN) 5-325 MG per tablet   Oral   Take 1 tablet by mouth every 4 (four) hours as needed for pain.   20 tablet   0   . meclizine (ANTIVERT) 25 MG tablet   Oral   Take 25 mg by mouth 3 (three) times daily as needed. For dizziness         . predniSONE (DELTASONE) 20 MG tablet      3 tabs po day one, then 2 po daily x 4 days   11 tablet   0     BP 154/93  Pulse 94  Temp(Src) 98.9 F (37.2 C) (Oral)  Resp 16  Ht 5\' 9"  (1.753 m)  Wt 305 lb (138.347 kg)  BMI 45.02 kg/m2  SpO2 100%  Physical Exam General: Well-developed, well-nourished male in no acute distress; appearance consistent with age of record HENT: normocephalic, atraumatic Eyes: Normal appearance Neck: supple Heart: regular  rate and rhythm Lungs: Normal respiratory effort and excursion Abdomen: soft; nondistended Rectal: Posterior midline fissure with sentinel tag; normal sphincter tone Extremities: No deformity; full range of motion Neurologic: Awake, alert and oriented; motor function intact in all extremities and symmetric; no facial droop Skin: Warm and dry Psychiatric: Mildly anxious    ED Course  Procedures (including critical care time)     MDM  Advised increasing fiber, specifically taking Metamucil daily. We'll provide topical analgesia, referral to surgery should he not improve with conservative management.        Hanley Seamen, MD 01/14/13 0231  Hanley Seamen, MD 01/14/13 [phone]

## 2013-01-16 ENCOUNTER — Telehealth (HOSPITAL_COMMUNITY): Payer: Self-pay | Admitting: Emergency Medicine

## 2013-03-31 ENCOUNTER — Emergency Department (HOSPITAL_COMMUNITY): Payer: Self-pay

## 2013-03-31 ENCOUNTER — Emergency Department (HOSPITAL_COMMUNITY)
Admission: EM | Admit: 2013-03-31 | Discharge: 2013-03-31 | Disposition: A | Discharge status: Home / Self Care | Payer: Self-pay | Attending: Emergency Medicine | Admitting: Emergency Medicine

## 2013-03-31 ENCOUNTER — Encounter (HOSPITAL_COMMUNITY): Payer: Self-pay | Admitting: Family Medicine

## 2013-03-31 DIAGNOSIS — R42 Dizziness and giddiness: Secondary | ICD-10-CM | POA: Insufficient documentation

## 2013-03-31 DIAGNOSIS — K2900 Acute gastritis without bleeding: Secondary | ICD-10-CM | POA: Insufficient documentation

## 2013-03-31 DIAGNOSIS — F411 Generalized anxiety disorder: Secondary | ICD-10-CM | POA: Insufficient documentation

## 2013-03-31 DIAGNOSIS — Z888 Allergy status to other drugs, medicaments and biological substances status: Secondary | ICD-10-CM | POA: Insufficient documentation

## 2013-03-31 DIAGNOSIS — R1012 Left upper quadrant pain: Secondary | ICD-10-CM | POA: Insufficient documentation

## 2013-03-31 DIAGNOSIS — J45909 Unspecified asthma, uncomplicated: Secondary | ICD-10-CM | POA: Insufficient documentation

## 2013-03-31 DIAGNOSIS — I10 Essential (primary) hypertension: Secondary | ICD-10-CM | POA: Insufficient documentation

## 2013-03-31 DIAGNOSIS — Z79899 Other long term (current) drug therapy: Secondary | ICD-10-CM | POA: Insufficient documentation

## 2013-03-31 DIAGNOSIS — K2970 Gastritis, unspecified, without bleeding: Secondary | ICD-10-CM

## 2013-03-31 DIAGNOSIS — E119 Type 2 diabetes mellitus without complications: Secondary | ICD-10-CM | POA: Insufficient documentation

## 2013-03-31 LAB — COMPREHENSIVE METABOLIC PANEL
ALT: 26 U/L (ref 0–53)
AST: 18 U/L (ref 0–37)
Albumin: 3.4 g/dL — ABNORMAL LOW (ref 3.5–5.2)
CO2: 26 mEq/L (ref 19–32)
Calcium: 9.1 mg/dL (ref 8.4–10.5)
Chloride: 98 mEq/L (ref 96–112)
GFR calc non Af Amer: >90 mL/min (ref >90)
Sodium: 135 mEq/L (ref 135–145)

## 2013-03-31 LAB — CBC WITH DIFFERENTIAL/PLATELET
Basophils Absolute: 0.1 10*3/uL (ref 0.0–0.1)
Eosinophils Relative: 4 % (ref 0–5)
Lymphocytes Relative: 35 % (ref 12–46)
Neutro Abs: 3.9 10*3/uL (ref 1.7–7.7)
Platelets: 324 10*3/uL (ref 150–400)
RDW: 12.8 % (ref 11.5–15.5)
WBC: 7.4 10*3/uL (ref 4.0–10.5)

## 2013-03-31 LAB — POCT I-STAT TROPONIN I

## 2013-03-31 MED ORDER — GI COCKTAIL ~~LOC~~
30.0000 mL | Freq: Once | ORAL | Status: AC
  Administered 2013-03-31: 30 mL via ORAL
  Filled 2013-03-31: qty 30

## 2013-03-31 MED ORDER — METFORMIN HCL 500 MG PO TABS: 500.0000 mg | ORAL_TABLET | Freq: Two times a day (BID) | ORAL | Status: DC

## 2013-03-31 NOTE — ED Provider Notes (Signed)
History    CSN: [id number] Arrival date & time 03/31/13  1478  First MD Initiated Contact with Patient 03/31/13 0800     Chief Complaint  Patient presents with  . Chest Pain  . Abdominal Pain   (Consider location/radiation/quality/duration/timing/severity/associated sxs/prior Treatment) HPI Comments: Patient is a 37 year old warm who had onset of pain in his left lower chest and left upper abdominal quadrant around 6:30 to 645.  There is no history of heart disease.  He has known diabetes and hypertension, but is not taking medicine for either of these conditions.  Patient is a 37 y.o. male presenting with chest pain and abdominal pain. The history is provided by the patient and medical records. No language interpreter was used.  Chest Pain Pain location:  L chest (Left upper abdomen.) Pain quality: burning and pressure   Pain radiates to:  Does not radiate Pain radiates to the back: no   Pain severity now: He had a combination of mild to moderate pain, combined with anxiety that he could be having a heart attack. Onset quality:  Gradual (Pain noted at 6:30 A.M.  ) Duration:  90 minutes Timing:  Constant Progression:  Waxing and waning Chronicity:  New Context: at rest   Relieved by:  Nothing (He took an aspirin as well as a dose of Pepto-Bismol, without much relief.) Worsened by:  Nothing tried Associated symptoms: abdominal pain   Associated symptoms: no fever, no nausea, no palpitations and not vomiting   Abdominal pain:    Location:  LUQ   Quality:  Burning   Severity:  Moderate   Onset quality:  Gradual   Duration:  2 hours   Timing:  Constant   Progression:  Waxing and waning   Chronicity:  New Risk factors: diabetes mellitus, hypertension and obesity   Abdominal Pain Associated symptoms include chest pain and abdominal pain.   Past Medical History  Diagnosis Date  . Asthma   . Diabetes mellitus   . Hypertension   . Anxiety   . Vertigo    History reviewed.  No pertinent past surgical history. History reviewed. No pertinent family history. History  Substance Use Topics  . Smoking status: Never Smoker   . Smokeless tobacco: Never Used  . Alcohol Use: No    Review of Systems  Constitutional: Negative for fever and chills.  HENT: Negative.   Eyes: Negative.   Respiratory: Negative.   Cardiovascular: Positive for chest pain. Negative for palpitations and leg swelling.  Gastrointestinal: Positive for abdominal pain. Negative for nausea and vomiting.  Genitourinary: Negative.   Musculoskeletal: Negative.   Skin: Negative.   Neurological: Negative.   Psychiatric/Behavioral: The patient is nervous/anxious.     Allergies  Slo-bid gyrocaps and Theophyllines  Home Medications   Current Outpatient Rx  Name  Route  Sig  Dispense  Refill  . albuterol (PROVENTIL HFA;VENTOLIN HFA) 108 (90 BASE) MCG/ACT inhaler   Inhalation   Inhale 2 puffs into the lungs every 6 (six) hours as needed. As needed for shortness of breath.          Marland Kitchen albuterol (PROVENTIL HFA;VENTOLIN HFA) 108 (90 BASE) MCG/ACT inhaler   Inhalation   Inhale 1-2 puffs into the lungs every 6 (six) hours as needed for wheezing.   1 Inhaler   0   . cyclobenzaprine (FLEXERIL) 10 MG tablet   Oral   Take 1 tablet (10 mg total) by mouth 2 (two) times daily as needed for muscle spasms.   15  tablet   0   . enalapril (VASOTEC) 10 MG tablet   Oral   Take 10 mg by mouth daily.          Marland Kitchen Ephedrine-Guaifenesin 12.5-200 MG TABS   Oral   Take by mouth.         . glyBURIDE-metformin (GLUCOVANCE) 5-500 MG per tablet   Oral   Take 1 tablet by mouth 2 (two) times daily.          Marland Kitchen HYDROcodone-acetaminophen (NORCO/VICODIN) 5-325 MG per tablet   Oral   Take 1 tablet by mouth every 4 (four) hours as needed for pain.   20 tablet   0   . Lidocaine, Anorectal, 5 % GEL   Apply externally   Apply 1 application topically as needed (for rectal pain).   113 g   1   . meclizine  (ANTIVERT) 25 MG tablet   Oral   Take 25 mg by mouth 3 (three) times daily as needed. For dizziness         . predniSONE (DELTASONE) 20 MG tablet      3 tabs po day one, then 2 po daily x 4 days   11 tablet   0    BP 142/75  Pulse 88  Temp(Src) 98.2 F (36.8 C) (Oral)  Resp 18  SpO2 96% Physical Exam  Nursing note and vitals reviewed. Constitutional: He is oriented to person, place, and time.  Morbidly obese young man, anxious appearance.  HENT:  Head: Normocephalic and atraumatic.  Right Ear: External ear normal.  Left Ear: External ear normal.  Mouth/Throat: Oropharynx is clear and moist.  Eyes: Conjunctivae and EOM are normal. Pupils are equal, round, and reactive to light.  Neck: Normal range of motion. Neck supple.  Cardiovascular: Normal rate, regular rhythm and normal heart sounds.   Pulmonary/Chest: Effort normal and breath sounds normal.  He localizes pain to the left lower anterior chest and to the left upper abdominal quadrant.  There is tenderness to palpation and no mass.  Abdominal: Soft. Bowel sounds are normal.  Musculoskeletal: Normal range of motion. He exhibits no edema and no tenderness.  Neurological: He is alert and oriented to person, place, and time.  No sensory or motor deficit.  Skin: Skin is warm and dry.  Psychiatric:  Initially anxious.    ED Course  Procedures (including critical care time) Labs Reviewed  CBC WITH DIFFERENTIAL  COMPREHENSIVE METABOLIC PANEL  LIPASE, BLOOD   8:03 AM  Date: 03/31/2013  Rate:89  Rhythm: normal sinus rhythm  QRS Axis: right  Intervals: normal  ST/T Wave abnormalities: nonspecific T wave changes  Conduction Disutrbances:none  Narrative Interpretation: Abnormal EKG  Old EKG Reviewed: unchanged  Results for orders placed during the hospital encounter of 03/31/13  CBC WITH DIFFERENTIAL      Result Value Range   WBC 7.4  4.0 - 10.5 K/uL   RBC 5.58  4.22 - 5.81 MIL/uL   Hemoglobin 15.6  13.0 - 17.0  g/dL   HCT 40.9  81.1 - 91.4 %   MCV 81.2  78.0 - 100.0 fL   MCH 28.0  26.0 - 34.0 pg   MCHC 34.4  30.0 - 36.0 g/dL   RDW 78.2  95.6 - 21.3 %   Platelets 324  150 - 400 K/uL   Neutrophils Relative % 52  43 - 77 %   Neutro Abs 3.9  1.7 - 7.7 K/uL   Lymphocytes Relative 35  12 - 46 %  Lymphs Abs 2.6  0.7 - 4.0 K/uL   Monocytes Relative 9  3 - 12 %   Monocytes Absolute 0.7  0.1 - 1.0 K/uL   Eosinophils Relative 4  0 - 5 %   Eosinophils Absolute 0.3  0.0 - 0.7 K/uL   Basophils Relative 1  0 - 1 %   Basophils Absolute 0.1  0.0 - 0.1 K/uL  COMPREHENSIVE METABOLIC PANEL      Result Value Range   Sodium 135  135 - 145 mEq/L   Potassium 3.9  3.5 - 5.1 mEq/L   Chloride 98  96 - 112 mEq/L   CO2 26  19 - 32 mEq/L   Glucose, Bld 324 (*) 70 - 99 mg/dL   BUN 11  6 - 23 mg/dL   Creatinine, Ser 1.61  0.50 - 1.35 mg/dL   Calcium 9.1  8.4 - 09.6 mg/dL   Total Protein 7.7  6.0 - 8.3 g/dL   Albumin 3.4 (*) 3.5 - 5.2 g/dL   AST 18  0 - 37 U/L   ALT 26  0 - 53 U/L   Alkaline Phosphatase 115  39 - 117 U/L   Total Bilirubin 0.7  0.3 - 1.2 mg/dL   GFR calc non Af Amer >90  >90 mL/min   GFR calc Af Amer >90  >90 mL/min  LIPASE, BLOOD      Result Value Range   Lipase 61 (*) 11 - 59 U/L  POCT I-STAT TROPONIN I      Result Value Range   Troponin i, poc 0.00  0.00 - 0.08 ng/mL   Comment 3            Dg Abd Acute W/chest  03/31/2013   *RADIOLOGY REPORT*  Clinical Data: New onset left lower chest and left upper quadrant pain  ACUTE ABDOMEN SERIES (ABDOMEN 2 VIEW & CHEST 1 VIEW)  Comparison: Chest x-ray of 01/02/2013  Findings: No active infiltrate or effusion is seen.  Mediastinal contours appear normal.  The heart is within normal limits in size. No bony abnormality is seen.  Supine and erect views of the abdomen show a moderate amount of feces throughout the colon.  No bowel obstruction is seen.  No free air is noted on the erect view.  No opaque calculi are noted.  IMPRESSION:  1.  No active lung  disease. 2.  Moderate amount of feces throughout the colon. 3.  No bowel obstruction.  No free air.   Original Report Authenticated By: Dwyane Dee, M.D.   Pt was given a GI cocktail, with relief of his symptoms. Lab tests showed elevated glucose of 324, with CO2 of 26.  Troponin I negative.  Lipase trivially elevated at 61.  Pt advised that he had not had a heart attack, and that his main health problem was his untreated diabetes.  He was placed on Metformin 500 mg bid, and was advised that he would need monitoring of his diabetes on a regular basis by a primary care physician, as his medicines would probably have to be changed over time.  He was advised that there were many serious complications of diabetes, and that he should be on medication for diabetes for the rest of his life.  He was referred to [facility], to establish himself as their patient and get into continuing treatment.  1. Gastritis   2. Diabetes mellitus      Carleene Cooper III, MD 04/01/13 [phone]

## 2013-03-31 NOTE — ED Notes (Signed)
Per pt sts about 6:30 this am he started having left sided upper abdominal chest pain. Describes the pain as a pressure. Denies N,V,SOB. sts felt like he was going to pass out.

## 2013-03-31 NOTE — ED Notes (Signed)
Pt alert and mentating appropriately upon d/c. Pt given d/c teaching, prescriptions and follow up care instructions. Pt verbalizes understanding and has no further questions upon d/c teaching. Pt ambulatory upon d/c. NAD noted.

## 2013-03-31 NOTE — ED Notes (Signed)
Dr. Davidson at bedside.

## 2013-05-27 ENCOUNTER — Encounter (HOSPITAL_BASED_OUTPATIENT_CLINIC_OR_DEPARTMENT_OTHER): Payer: Self-pay | Admitting: Emergency Medicine

## 2013-05-27 ENCOUNTER — Emergency Department (HOSPITAL_BASED_OUTPATIENT_CLINIC_OR_DEPARTMENT_OTHER)
Admission: EM | Admit: 2013-05-27 | Discharge: 2013-05-27 | Disposition: A | Discharge status: Home / Self Care | Payer: Self-pay | Attending: Emergency Medicine | Admitting: Emergency Medicine

## 2013-05-27 DIAGNOSIS — R51 Headache: Secondary | ICD-10-CM | POA: Insufficient documentation

## 2013-05-27 DIAGNOSIS — Z87891 Personal history of nicotine dependence: Secondary | ICD-10-CM | POA: Insufficient documentation

## 2013-05-27 DIAGNOSIS — Z7982 Long term (current) use of aspirin: Secondary | ICD-10-CM | POA: Insufficient documentation

## 2013-05-27 DIAGNOSIS — E119 Type 2 diabetes mellitus without complications: Secondary | ICD-10-CM | POA: Insufficient documentation

## 2013-05-27 DIAGNOSIS — Z8659 Personal history of other mental and behavioral disorders: Secondary | ICD-10-CM | POA: Insufficient documentation

## 2013-05-27 DIAGNOSIS — I10 Essential (primary) hypertension: Secondary | ICD-10-CM | POA: Insufficient documentation

## 2013-05-27 DIAGNOSIS — J45909 Unspecified asthma, uncomplicated: Secondary | ICD-10-CM | POA: Insufficient documentation

## 2013-05-27 DIAGNOSIS — Z79899 Other long term (current) drug therapy: Secondary | ICD-10-CM | POA: Insufficient documentation

## 2013-05-27 LAB — BASIC METABOLIC PANEL
CO2: 25 mEq/L (ref 19–32)
Chloride: 101 mEq/L (ref 96–112)
Creatinine, Ser: 0.6 mg/dL (ref 0.50–1.35)
Potassium: 3.7 mEq/L (ref 3.5–5.1)
Sodium: 138 mEq/L (ref 135–145)

## 2013-05-27 MED ORDER — ALBUTEROL SULFATE HFA 108 (90 BASE) MCG/ACT IN AERS
2.0000 | INHALATION_SPRAY | Freq: Once | RESPIRATORY_TRACT | Status: AC
  Administered 2013-05-27: 2 via RESPIRATORY_TRACT
  Filled 2013-05-27: qty 6.7

## 2013-05-27 NOTE — ED Notes (Signed)
Pt states he needs to leave due to early work in AM. Advised EDP with emergency x2 in department. Pt states he will sign out AMA if needed.

## 2013-05-27 NOTE — ED Notes (Signed)
EDP Belfi advised and at bedside to d/c pt.

## 2013-05-27 NOTE — Patient Instructions (Signed)
Instructed patient on the proper use of administering albuteral mdi via aerochamber patient tolerated well 

## 2013-05-27 NOTE — ED Notes (Signed)
Pt discovered today that his bp was high so he wanted to come get it checked out.  At home it was 160/90

## 2013-05-27 NOTE — ED Provider Notes (Signed)
CSN: [id number]     Arrival date & time 05/27/13  2010 History   First MD Initiated Contact with Patient 05/27/13 2051     Chief Complaint  Patient presents with  . Hypertension   (Consider location/radiation/quality/duration/timing/severity/associated sxs/prior Treatment) HPI Comments: Patient presents with elevated blood pressure. He does have a history of hypertension and diabetes. He has been off of his anti-hypertensive medications for a couple years given his new insurance status. He states the last few times detected it's been normal but today he noticed it was higher than normal in the 170/90 range. He has a mild bifrontal headache. He denies any chest pain or shortness of breath. He denies any neurologic deficits.   Patient is a 37 y.o. male presenting with hypertension.  Hypertension Associated symptoms include headaches. Pertinent negatives include no chest pain, no abdominal pain and no shortness of breath.    Past Medical History  Diagnosis Date  . Asthma   . Diabetes mellitus   . Hypertension   . Anxiety   . Vertigo    History reviewed. No pertinent past surgical history. No family history on file. History  Substance Use Topics  . Smoking status: Former Games developer  . Smokeless tobacco: Never Used  . Alcohol Use: No    Review of Systems  Constitutional: Negative for fever, chills, diaphoresis and fatigue.  HENT: Negative for congestion, rhinorrhea and sneezing.   Eyes: Negative.   Respiratory: Negative for cough, chest tightness and shortness of breath.   Cardiovascular: Negative for chest pain and leg swelling.  Gastrointestinal: Negative for nausea, vomiting, abdominal pain, diarrhea and blood in stool.  Genitourinary: Negative for frequency, hematuria, flank pain and difficulty urinating.  Musculoskeletal: Negative for back pain and arthralgias.  Skin: Negative for rash.  Neurological: Positive for headaches. Negative for dizziness, speech difficulty, weakness and  numbness.    Allergies  Slo-bid gyrocaps and Theophyllines  Home Medications   Current Outpatient Rx  Name  Route  Sig  Dispense  Refill  . albuterol (PROVENTIL HFA;VENTOLIN HFA) 108 (90 BASE) MCG/ACT inhaler   Inhalation   Inhale 1-2 puffs into the lungs every 6 (six) hours as needed. As needed for shortness of breath.         Marland Kitchen aspirin EC 81 MG tablet   Oral   Take 81 mg by mouth daily.         Marland Kitchen bismuth subsalicylate (PEPTO BISMOL) 262 MG/15ML suspension   Oral   Take 15 mLs by mouth once as needed for indigestion.         . metFORMIN (GLUCOPHAGE) 500 MG tablet   Oral   Take 1 tablet (500 mg total) by mouth 2 (two) times daily with a meal.   60 tablet   2    BP 157/91  Pulse 92  Temp(Src) 99.1 F (37.3 C) (Oral)  Resp 18  Ht 5\' 9"  (1.753 m)  Wt 315 lb (142.883 kg)  BMI 46.5 kg/m2  SpO2 96% Physical Exam  Constitutional: He is oriented to person, place, and time. He appears well-developed and well-nourished.  HENT:  Head: Normocephalic and atraumatic.  Eyes: Pupils are equal, round, and reactive to light.  Neck: Normal range of motion. Neck supple.  Cardiovascular: Normal rate, regular rhythm and normal heart sounds.   Pulmonary/Chest: Effort normal and breath sounds normal. No respiratory distress. He has no wheezes. He has no rales. He exhibits no tenderness.  Abdominal: Soft. Bowel sounds are normal. There is no tenderness. There  is no rebound and no guarding.  Musculoskeletal: Normal range of motion. He exhibits no edema.  Lymphadenopathy:    He has no cervical adenopathy.  Neurological: He is alert and oriented to person, place, and time. He has normal strength. No cranial nerve deficit or sensory deficit. GCS eye subscore is 4. GCS verbal subscore is 5. GCS motor subscore is 6.  Skin: Skin is warm and dry. No rash noted.  Psychiatric: He has a normal mood and affect.    ED Course  Procedures (including critical care time) Labs Review Results for  orders placed during the hospital encounter of 05/27/13  BASIC METABOLIC PANEL      Result Value Range   Sodium 138  135 - 145 mEq/L   Potassium 3.7  3.5 - 5.1 mEq/L   Chloride 101  96 - 112 mEq/L   CO2 25  19 - 32 mEq/L   Glucose, Bld 314 (*) 70 - 99 mg/dL   BUN 14  6 - 23 mg/dL   Creatinine, Ser 6.21  0.50 - 1.35 mg/dL   Calcium 9.9  8.4 - 30.8 mg/dL   GFR calc non Af Amer >90  >90 mL/min   GFR calc Af Amer >90  >90 mL/min   No results found.   Date: 05/27/2013  Rate: 92  Rhythm: normal sinus rhythm  QRS Axis: normal  Intervals: normal  ST/T Wave abnormalities: nonspecific ST/T changes  Conduction Disutrbances:none  Narrative Interpretation:   Old EKG Reviewed: unchanged   Imaging Review No results found.  MDM   1. Hypertension    Patient presents with hypertension. His last blood pressure rechecked in the ED was 154/76. He's asymptomatic. He's not had any chest pain shortness of breath or neurologic deficits. His renal function is okay. His EKG did not show ischemic changes. He was discharged home in good condition advised to followup with her primary care physician for recheck. He was given information about the [facility]    Rolan Bucco, MD 05/27/13 2337

## 2013-06-10 ENCOUNTER — Ambulatory Visit: Payer: Self-pay | Attending: Internal Medicine | Admitting: Internal Medicine

## 2013-06-10 VITALS — BP 134/83 | HR 86 | Temp 98.5°F | Resp 16 | Ht 69.0 in | Wt 320.0 lb

## 2013-06-10 DIAGNOSIS — E1165 Type 2 diabetes mellitus with hyperglycemia: Secondary | ICD-10-CM | POA: Insufficient documentation

## 2013-06-10 DIAGNOSIS — Z91199 Patient's noncompliance with other medical treatment and regimen due to unspecified reason: Secondary | ICD-10-CM | POA: Insufficient documentation

## 2013-06-10 DIAGNOSIS — Z7982 Long term (current) use of aspirin: Secondary | ICD-10-CM | POA: Insufficient documentation

## 2013-06-10 DIAGNOSIS — Z9119 Patient's noncompliance with other medical treatment and regimen: Secondary | ICD-10-CM | POA: Insufficient documentation

## 2013-06-10 DIAGNOSIS — J45909 Unspecified asthma, uncomplicated: Secondary | ICD-10-CM | POA: Insufficient documentation

## 2013-06-10 DIAGNOSIS — I10 Essential (primary) hypertension: Secondary | ICD-10-CM | POA: Insufficient documentation

## 2013-06-10 DIAGNOSIS — E119 Type 2 diabetes mellitus without complications: Secondary | ICD-10-CM | POA: Insufficient documentation

## 2013-06-10 LAB — POCT GLYCOSYLATED HEMOGLOBIN (HGB A1C): Hemoglobin A1C: 12.7

## 2013-06-10 MED ORDER — METFORMIN HCL 850 MG PO TABS: 850.0000 mg | ORAL_TABLET | Freq: Two times a day (BID) | ORAL | Status: DC

## 2013-06-10 MED ORDER — ENALAPRIL MALEATE 10 MG PO TABS: 10.0000 mg | ORAL_TABLET | Freq: Every day | ORAL | Status: DC

## 2013-06-10 MED ORDER — ALBUTEROL SULFATE HFA 108 (90 BASE) MCG/ACT IN AERS: 2.0000 | INHALATION_SPRAY | Freq: Four times a day (QID) | RESPIRATORY_TRACT | Status: DC | PRN

## 2013-06-10 NOTE — Patient Instructions (Signed)
DASH Diet The DASH diet stands for "Dietary Approaches to Stop Hypertension." It is a healthy eating plan that has been shown to reduce high blood pressure (hypertension) in as little as 14 days, while also possibly providing other significant health benefits. These other health benefits include reducing the risk of breast cancer after menopause and reducing the risk of type 2 diabetes, heart disease, colon cancer, and stroke. Health benefits also include weight loss and slowing kidney failure in patients with chronic kidney disease.  DIET GUIDELINES  Limit salt (sodium). Your diet should contain less than 1500 mg of sodium daily.  Limit refined or processed carbohydrates. Your diet should include mostly whole grains. Desserts and added sugars should be used sparingly.  Include small amounts of heart-healthy fats. These types of fats include nuts, oils, and tub margarine. Limit saturated and trans fats. These fats have been shown to be harmful in the body. CHOOSING FOODS  The following food groups are based on a 2000 calorie diet. See your Registered Dietitian for individual calorie needs. Grains and Grain Products (6 to 8 servings daily)  Eat More Often: Whole-wheat bread, brown rice, whole-grain or wheat pasta, quinoa, popcorn without added fat or salt (air popped).  Eat Less Often: White bread, white pasta, white rice, cornbread. Vegetables (4 to 5 servings daily)  Eat More Often: Fresh, frozen, and canned vegetables. Vegetables may be raw, steamed, roasted, or grilled with a minimal amount of fat.  Eat Less Often/Avoid: Creamed or fried vegetables. Vegetables in a cheese sauce. Fruit (4 to 5 servings daily)  Eat More Often: All fresh, canned (in natural juice), or frozen fruits. Dried fruits without added sugar. One hundred percent fruit juice ( cup [237 mL] daily).  Eat Less Often: Dried fruits with added sugar. Canned fruit in light or heavy syrup. [employer], Fish, and Poultry (2  servings or less daily. One serving is 3 to 4 oz [85-114 g]).  Eat More Often: Ninety percent or leaner ground beef, tenderloin, sirloin. Round cuts of beef, chicken breast, [place] breast. All fish. Grill, bake, or broil your meat. Nothing should be fried.  Eat Less Often/Avoid: Fatty cuts of meat, [place], or chicken leg, thigh, or wing. Fried cuts of meat or fish. Dairy (2 to 3 servings)  Eat More Often: Low-fat or fat-free milk, low-fat plain or light yogurt, reduced-fat or part-skim cheese.  Eat Less Often/Avoid: Milk (whole, 2%).Whole milk yogurt. Full-fat cheeses. Nuts, Seeds, and Legumes (4 to 5 servings per week)  Eat More Often: All without added salt.  Eat Less Often/Avoid: Salted nuts and seeds, canned beans with added salt. Fats and Sweets (limited)  Eat More Often: Vegetable oils, tub margarines without trans fats, sugar-free gelatin. Mayonnaise and salad dressings.  Eat Less Often/Avoid: Coconut oils, palm oils, butter, stick margarine, cream, half and half, cookies, candy, pie. FOR MORE INFORMATION The Dash Diet Eating Plan: www.dashdiet.org Document Released: 08/29/2011 Document Revised: 12/02/2011 Document Reviewed: 08/29/2011 [facility] Patient Information 2014 [place], [place]. Diabetes and Exercise Regular exercise is important and can help:   Control blood glucose (sugar).  Decrease blood pressure.    Control blood lipids (cholesterol, triglycerides).  Improve overall health. BENEFITS FROM EXERCISE  Improved fitness.  Improved flexibility.  Improved endurance.  Increased bone density.  Weight control.  Increased muscle strength.  Decreased body fat.  Improvement of the body's use of insulin, a hormone.  Increased insulin sensitivity.  Reduction of insulin needs.  Reduced stress and tension.  Helps you feel better.  People with diabetes who add exercise to their lifestyle gain additional benefits, including:  Weight loss.  Reduced  appetite.  Improvement of the body's use of blood glucose.  Decreased risk factors for heart disease:  Lowering of cholesterol and triglycerides.  Raising the level of good cholesterol (high-density lipoproteins, HDL).  Lowering blood sugar.  Decreased blood pressure. TYPE 1 DIABETES AND EXERCISE  Exercise will usually lower your blood glucose.  If blood glucose is greater than 240 mg/dl, check urine ketones. If ketones are present, do not exercise.  Location of the insulin injection sites may need to be adjusted with exercise. Avoid injecting insulin into areas of the body that will be exercised. For example, avoid injecting insulin into:  The arms when playing tennis.  The legs when jogging. For more information, discuss this with your caregiver.  Keep a record of:  Food intake.  Type and amount of exercise.  Expected peak times of insulin action.  Blood glucose levels. Do this before, during, and after exercise. Review your records with your caregiver. This will help you to develop guidelines for adjusting food intake and insulin amounts.  TYPE 2 DIABETES AND EXERCISE  Regular physical activity can help control blood glucose.  Exercise is important because it may:  Increase the body's sensitivity to insulin.  Improve blood glucose control.  Exercise reduces the risk of heart disease. It decreases serum cholesterol and triglycerides. It also lowers blood pressure.  Those who take insulin or oral hypoglycemic agents should watch for signs of hypoglycemia. These signs include dizziness, shaking, sweating, chills, and confusion.  Body water is lost during exercise. It must be replaced. This will help to avoid loss of body fluids (dehydration) or heat stroke. Be sure to talk to your caregiver before starting an exercise program to make sure it is safe for you. Remember, any activity is better than none.  Document Released: 11/30/2003 Document Revised: 12/02/2011  Document Reviewed: 03/16/2009 [facility] Patient Information 2014 [place], [place]. Hypertension As your heart beats, it forces blood through your arteries. This force is your blood pressure. If the pressure is too high, it is called hypertension (HTN) or high blood pressure. HTN is dangerous because you may have it and not know it. High blood pressure may mean that your heart has to work harder to pump blood. Your arteries may be narrow or stiff. The extra work puts you at risk for heart disease, stroke, and other problems.  Blood pressure consists of two numbers, a higher number over a lower, 110/72, for example. It is stated as "110 over 72." The ideal is below 120 for the top number (systolic) and under 80 for the bottom (diastolic). Write down your blood pressure today. You should pay close attention to your blood pressure if you have certain conditions such as:  Heart failure.  Prior heart attack.  Diabetes  Chronic kidney disease.  Prior stroke.  Multiple risk factors for heart disease. To see if you have HTN, your blood pressure should be measured while you are seated with your arm held at the level of the heart. It should be measured at least twice. A one-time elevated blood pressure reading (especially in the Emergency Department) does not mean that you need treatment. There may be conditions in which the blood pressure is different between your right and left arms. It is important to see your caregiver soon for a recheck. Most people have essential hypertension which means that there is not a specific cause. This type  of high blood pressure may be lowered by changing lifestyle factors such as:  Stress.  Smoking.  Lack of exercise.  Excessive weight.  Drug/tobacco/alcohol use.  Eating less salt. Most people do not have symptoms from high blood pressure until it has caused damage to the body. Effective treatment can often prevent, delay or reduce that damage. TREATMENT  When a  cause has been identified, treatment for high blood pressure is directed at the cause. There are a large number of medications to treat HTN. These fall into several categories, and your caregiver will help you select the medicines that are best for you. Medications may have side effects. You should review side effects with your caregiver. If your blood pressure stays high after you have made lifestyle changes or started on medicines,   Your medication(s) may need to be changed.  Other problems may need to be addressed.  Be certain you understand your prescriptions, and know how and when to take your medicine.  Be sure to follow up with your caregiver within the time frame advised (usually within two weeks) to have your blood pressure rechecked and to review your medications.  If you are taking more than one medicine to lower your blood pressure, make sure you know how and at what times they should be taken. Taking two medicines at the same time can result in blood pressure that is too low. SEEK IMMEDIATE MEDICAL CARE IF:  You develop a severe headache, blurred or changing vision, or confusion.  You have unusual weakness or numbness, or a faint feeling.  You have severe chest or abdominal pain, vomiting, or breathing problems. MAKE SURE YOU:   Understand these instructions.  Will watch your condition.  Will get help right away if you are not doing well or get worse. Document Released: 09/09/2005 Document Revised: 12/02/2011 Document Reviewed: 04/29/2008 [facility] Patient Information 2014 [place], [place].

## 2013-06-10 NOTE — Progress Notes (Signed)
PT HERE TO ESTABLISH CARE HTN,DIABETES TAKING PRESCRIBED METFORMIN 500MG  BID LAST CBG 324 NEED A1C

## 2013-06-10 NOTE — Progress Notes (Signed)
Patient ID: Michael Foster, male   DOB: [birth year], 37 y.o.   MRN: [id number] Patient Demographics  Michael Foster, is a 37 y.o. male  [id number]  [id number]  DOB - [birth year]  CC:  Chief Complaint  Patient presents with  . Establish Care  . Hypertension  . Diabetes       HPI: Michael Foster is a 37 y.o. male here today to establish medical care. He has history of hypertension and diabetes, morbidly obese. No complaints today. He lost his insurance late last year and since then has not been compliant with medications, he claims to be religious with exercise. He works refill of his medications and to repeat hemoglobin A1c today, he claims the previous hemoglobin A1c was 13% Patient has No headache, No chest pain, No abdominal pain - No Nausea, No new weakness tingling or numbness, No Cough - SOB.  Allergies  Allergen Reactions  . Slo-Bid Gyrocaps [Theophylline]     Unknown   . Theophyllines     Parents never to use. Unknown reaction.   Past Medical History  Diagnosis Date  . Asthma   . Diabetes mellitus   . Hypertension   . Anxiety   . Vertigo    Current Outpatient Prescriptions on File Prior to Visit  Medication Sig Dispense Refill  . aspirin EC 81 MG tablet Take 81 mg by mouth daily.      Marland Kitchen bismuth subsalicylate (PEPTO BISMOL) 262 MG/15ML suspension Take 15 mLs by mouth once as needed for indigestion.       No current facility-administered medications on file prior to visit.   No family history on file. History   Social History  . Marital Status: Married    Spouse Name: N/A    Number of Children: N/A  . Years of Education: N/A   Occupational History  . Not on file.   Social History Main Topics  . Smoking status: Former Games developer  . Smokeless tobacco: Never Used  . Alcohol Use: No  . Drug Use: No  . Sexual Activity: Not on file   Other Topics Concern  . Not on file   Social History Narrative  . No narrative on file    Review of  Systems: Constitutional: Negative for fever, chills, diaphoresis, activity change, appetite change and fatigue. HENT: Negative for ear pain, nosebleeds, congestion, facial swelling, rhinorrhea, neck pain, neck stiffness and ear discharge.  Eyes: Negative for pain, discharge, redness, itching and visual disturbance. Respiratory: Negative for cough, choking, chest tightness, shortness of breath, wheezing and stridor.  Cardiovascular: Negative for chest pain, palpitations and leg swelling. Gastrointestinal: Negative for abdominal distention. Genitourinary: Negative for dysuria, urgency, frequency, hematuria, flank pain, decreased urine volume, difficulty urinating and dyspareunia.  Musculoskeletal: Negative for back pain, joint swelling, arthralgia and gait problem. Neurological: Negative for dizziness, tremors, seizures, syncope, facial asymmetry, speech difficulty, weakness, light-headedness, numbness and headaches.  Hematological: Negative for adenopathy. Does not bruise/bleed easily. Psychiatric/Behavioral: Negative for hallucinations, behavioral problems, confusion, dysphoric mood, decreased concentration and agitation.    Objective:   Filed Vitals:   06/10/13 1702  BP: 134/83  Pulse: 86  Temp: 98.5 F (36.9 C)  Resp: 16    Physical Exam: Constitutional: Patient appears well-developed and well-nourished. No distress. Morbidly obese HENT: Normocephalic, atraumatic, External right and left ear normal. Oropharynx is clear and moist.  Eyes: Conjunctivae and EOM are normal. PERRLA, no scleral icterus. Neck: Normal ROM. Neck supple. No JVD. No tracheal deviation. No thyromegaly. CVS: RRR, S1/S2 +,  no murmurs, no gallops, no carotid bruit.  Pulmonary: Effort and breath sounds normal, no stridor, rhonchi, wheezes, rales.  Abdominal: Soft. BS +, no distension, tenderness, rebound or guarding.  Musculoskeletal: Normal range of motion. No edema and no tenderness.  Lymphadenopathy: No  lymphadenopathy noted, cervical, inguinal or axillary Neuro: Alert. Normal reflexes, muscle tone coordination. No cranial nerve deficit. Skin: Skin is warm and dry. No rash noted. Not diaphoretic. No erythema. No pallor. Psychiatric: Normal mood and affect. Behavior, judgment, thought content normal.  Lab Results  Component Value Date   WBC 7.4 03/31/2013   HGB 15.6 03/31/2013   HCT 45.3 03/31/2013   MCV 81.2 03/31/2013   PLT 324 03/31/2013   Lab Results  Component Value Date   CREATININE 0.60 05/27/2013   BUN 14 05/27/2013   NA 138 05/27/2013   K 3.7 05/27/2013   CL 101 05/27/2013   CO2 25 05/27/2013    Lab Results  Component Value Date   HGBA1C 9.9* 01/17/2010   Lipid Panel     Component Value Date/Time   CHOL 154 01/17/2010 0000   TRIG 334.0* 01/17/2010 0000   HDL 32.20* 01/17/2010 0000   CHOLHDL 5 01/17/2010 0000   VLDL 66.8* 01/17/2010 0000       Assessment and plan:   Patient Active Problem List   Diagnosis Date Noted  . Diabetes 06/10/2013  . Essential hypertension, benign 06/10/2013  . Morbid obesity 06/10/2013  . Asthma, chronic 06/10/2013  . TOBACCO ABUSE 03/22/2010  . BACK PAIN 03/22/2010  . ASTHMA 02/03/2010  . DIABETES MELLITUS, TYPE II 01/17/2010  . HYPERLIPIDEMIA 01/17/2010  . HYPERTENSION 01/17/2010  . SLEEP APNEA 01/17/2010   Recent labs reviewed within normal limit is a for blood glucose Hemoglobin A1c today is 12.8% Plan: Increase metformin tablet to 1000 mg by mouth twice a day Continue enalapril tablet 10 mg by mouth daily Continue aspirin tablet 81 mg by mouth daily Continue albuterol inhaler 2 puffs every 6 hour when necessary  Patient extensively counseled about nutrition and exercise Patient declined the use of insulin at this time despite repeated education regarding hemoglobin A1c of above 10, he claims to prefer to use to tablet now and see the impact on hemoglobin A1c in 3 months time We discussed low-salt diet, as well as low carbohydrate and low fat  diet  Follow up in one month with blood glucose log repeat kidney function test  The patient was given clear instructions to go to ER or return to medical center if symptoms don't improve, worsen or new problems develop. The patient verbalized understanding. The patient was told to call to get lab results if they haven't heard anything in the next week.   Jeanann Lewandowsky, MD, MHA, FACP [facility] [place], [place] [phone]   06/10/2013, 5:16 PM

## 2013-06-11 ENCOUNTER — Encounter: Payer: Self-pay | Admitting: Internal Medicine

## 2013-08-02 ENCOUNTER — Telehealth: Payer: Self-pay

## 2013-08-02 NOTE — Telephone Encounter (Signed)
Returned patients call he stated he injured his back Last night not sure how but wanted to come in  i explained that urgent care could better serve his needs Since they can x ray on site and prescribe medications if needed

## 2013-08-27 ENCOUNTER — Ambulatory Visit (INDEPENDENT_AMBULATORY_CARE_PROVIDER_SITE_OTHER): Payer: Self-pay | Admitting: Pulmonary Disease

## 2013-08-27 ENCOUNTER — Encounter: Payer: Self-pay | Admitting: Pulmonary Disease

## 2013-08-27 VITALS — BP 144/78 | HR 91 | Temp 98.6°F | Ht 69.0 in | Wt 321.2 lb

## 2013-08-27 DIAGNOSIS — G4730 Sleep apnea, unspecified: Secondary | ICD-10-CM

## 2013-08-27 NOTE — Progress Notes (Signed)
Subjective:    Patient ID: Michael Foster, male    DOB: October 26, 1975, 37 y.o.   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  He reports asthma since childhood, never hospitalised, last urgent care visit 2 yrs ago, worse during spring, prefers primatene mist to albuterol mdi (less expensive).  He was diagnosed with severe obstructive sleep apnea after a sleep study  in 2005 & has been on cpap ever since, cannot tolerate a single night off it.    08/27/2013 3y FU  Chief Complaint  Patient presents with  . Sleep Consult    Here to re-establish. Pt last seen 2011. He is still wearing CPAP everynight. Pt is requetsing to have pressure adjusted.    Baseline PSG at [facility] 10/04 , wt 352, BMI 53 showed RDI 30/h, corrected by CPAP 9 cm  CPAP currently set at 14 cm, PSG showed this level corrects events but needs 19 cm to abolish snoring,   Spirometry FEv1 50%, pos BD response  Bedtime around 11 PM, sleep latency is variable, sleeps on his side with 2 pillows, reports one nocturnal awakening and is out of bed at 6 AM feeling rested without dryness of mouth. He is lost 30 pounds since the sleep study. His CPAP machine is very old and is set at 14 cm apparently he also has his brother's CPAP machine which would like to be set. He has full face mask but has not received supplies in 3 years. He is using electronic cigarettes for 3 months after an ER visit for uncontrolled diabetes  Past Medical History  Diagnosis Date  . Asthma   . Diabetes mellitus   . Hypertension   . Anxiety   . Vertigo     No past surgical history on file.  Allergies  Allergen Reactions  . Slo-Bid Gyrocaps [Theophylline]     Unknown   . Theophyllines     Parents never to use. Unknown reaction.    History   Social History  . Marital Status: Married    Spouse Name: N/A    Number of Children: 2  . Years of Education: N/A    Occupational History  . IT    Social History Main Topics  . Smoking status: Former Smoker -- 0.30 packs/day for 10 years    Types: Cigarettes    Quit date: 05/28/2013  . Smokeless tobacco: Never Used  . Alcohol Use: No  . Drug Use: No  . Sexual Activity: Not on file   Other Topics Concern  . Not on file   Social History Narrative  . No narrative on file    Family History  Problem Relation Age of Onset  . COPD Father   . Hypertension Father   . Heart failure Father   . Diabetes Father   . Sleep apnea Brother      Review of Systems  Constitutional: Negative for fever and unexpected weight change.  HENT: Negative for congestion, dental problem, ear pain, nosebleeds, postnasal drip, rhinorrhea, sinus pressure, sneezing, sore throat and trouble swallowing.   Eyes: Negative for redness and itching.  Respiratory: Negative for cough, chest tightness, shortness of breath and wheezing.   Cardiovascular: Negative for palpitations and leg swelling.  Gastrointestinal: Negative for nausea and vomiting.  Genitourinary: Negative for dysuria.  Musculoskeletal: Negative for joint swelling.  Skin: Negative for rash.  Neurological: Negative for headaches.  Hematological: Does  not bruise/bleed easily.  Psychiatric/Behavioral: Negative for dysphoric mood. The patient is not nervous/anxious.        Objective:   Physical Exam  Gen. Pleasant, obese, in no distress, normal affect ENT - no lesions, no post nasal drip, class 2-3 airway Neck: No JVD, no thyromegaly, no carotid bruits Lungs: no use of accessory muscles, no dullness to percussion, decreased without rales or rhonchi  Cardiovascular: Rhythm regular, heart sounds  normal, no murmurs or gallops, no peripheral edema Abdomen: soft and non-tender, no hepatosplenomegaly, BS normal. Musculoskeletal: No deformities, no cyanosis or clubbing Neuro:  alert, non focal, no tremors       Assessment & Plan:

## 2013-08-27 NOTE — Patient Instructions (Signed)
Check download on current machine Your CPAP will be set at 14 cm Call us for Rx if needed

## 2013-08-27 NOTE — Assessment & Plan Note (Signed)
Check download on current machine Your CPAP will be set at 14 cm Call us for Rx if needed  Weight loss encouraged, compliance with goal of at least 4-6 hrs every night is the expectation. Advised against medications with sedative side effects Cautioned against driving when sleepy - understanding that sleepiness will vary on a day to day basis

## 2013-09-09 ENCOUNTER — Ambulatory Visit: Payer: Self-pay | Admitting: Internal Medicine

## 2013-09-20 ENCOUNTER — Ambulatory Visit: Payer: Self-pay | Attending: Internal Medicine | Admitting: Internal Medicine

## 2013-09-20 ENCOUNTER — Ambulatory Visit: Payer: Managed Care, Other (non HMO)

## 2013-09-20 VITALS — BP 136/84 | HR 88 | Temp 98.9°F | Resp 14 | Ht 69.0 in | Wt 316.8 lb

## 2013-09-20 DIAGNOSIS — E119 Type 2 diabetes mellitus without complications: Secondary | ICD-10-CM

## 2013-09-20 LAB — LIPID PANEL
Cholesterol: 209 mg/dL — ABNORMAL HIGH (ref 0–200)
HDL: 30 mg/dL — ABNORMAL LOW (ref >39)
Triglycerides: 524 mg/dL — ABNORMAL HIGH (ref <150)

## 2013-09-20 LAB — POCT GLYCOSYLATED HEMOGLOBIN (HGB A1C): Hemoglobin A1C: 12.7

## 2013-09-20 MED ORDER — INSULIN NPH ISOPHANE & REGULAR (70-30) 100 UNIT/ML ~~LOC~~ SUSP: 20.0000 [IU] | Freq: Two times a day (BID) | SUBCUTANEOUS | Status: DC

## 2013-09-20 MED ORDER — INSULIN SYRINGES (DISPOSABLE) U-100 0.5 ML MISC: Status: DC

## 2013-09-20 MED ORDER — GLUCOSE BLOOD VI STRP: ORAL_STRIP | Status: DC

## 2013-09-20 MED ORDER — FREESTYLE LANCETS MISC: Status: DC

## 2013-09-20 MED ORDER — FREESTYLE SYSTEM KIT: 1.0000 | PACK | Status: DC | PRN

## 2013-09-20 NOTE — Progress Notes (Signed)
Patient ID: Michael Foster, male   DOB: 05/23/1976, 37 y.o.   MRN: [id number]   CC:  HPI: 37 year old comes in for a followup of his diabetes. A1c is 12.7. CBGs to 270. Patient state that he lost 70 pounds in the last few months. Denies any chest pain any shortness of breath  Allergies  Allergen Reactions  . Slo-Bid Gyrocaps [Theophylline]     Unknown   . Theophyllines     Parents never to use. Unknown reaction.   Past Medical History  Diagnosis Date  . Asthma   . Diabetes mellitus   . Hypertension   . Anxiety   . Vertigo    Current Outpatient Prescriptions on File Prior to Visit  Medication Sig Dispense Refill  . albuterol (PROVENTIL HFA;VENTOLIN HFA) 108 (90 BASE) MCG/ACT inhaler Inhale 2 puffs into the lungs every 6 (six) hours as needed for wheezing or shortness of breath. As needed for shortness of breath.  1 Inhaler  2  . enalapril (VASOTEC) 10 MG tablet Take 1 tablet (10 mg total) by mouth daily.  90 tablet  2  . metFORMIN (GLUCOPHAGE) 850 MG tablet Take 1 tablet (850 mg total) by mouth 2 (two) times daily with a meal.  120 tablet  2  . aspirin EC 81 MG tablet Take 81 mg by mouth daily.      Marland Kitchen bismuth subsalicylate (PEPTO BISMOL) 262 MG/15ML suspension Take 15 mLs by mouth once as needed for indigestion.       No current facility-administered medications on file prior to visit.   Family History  Problem Relation Age of Onset  . COPD Father   . Hypertension Father   . Heart failure Father   . Diabetes Father   . Sleep apnea Brother    History   Social History  . Marital Status: Married    Spouse Name: N/A    Number of Children: 2  . Years of Education: N/A   Occupational History  . IT    Social History Main Topics  . Smoking status: Former Smoker -- 0.30 packs/day for 10 years    Types: Cigarettes    Quit date: 05/28/2013  . Smokeless tobacco: Never Used  . Alcohol Use: No  . Drug Use: No  . Sexual Activity: Not on file   Other Topics Concern  . Not  on file   Social History Narrative  . No narrative on file    Review of Systems  Constitutional: Negative for fever, chills, diaphoresis, activity change, appetite change and fatigue.  HENT: Negative for ear pain, nosebleeds, congestion, facial swelling, rhinorrhea, neck pain, neck stiffness and ear discharge.   Eyes: Negative for pain, discharge, redness, itching and visual disturbance.  Respiratory: Negative for cough, choking, chest tightness, shortness of breath, wheezing and stridor.   Cardiovascular: Negative for chest pain, palpitations and leg swelling.  Gastrointestinal: Negative for abdominal distention.  Genitourinary: Negative for dysuria, urgency, frequency, hematuria, flank pain, decreased urine volume, difficulty urinating and dyspareunia.  Musculoskeletal: Negative for back pain, joint swelling, arthralgias and gait problem.  Neurological: Negative for dizziness, tremors, seizures, syncope, facial asymmetry, speech difficulty, weakness, light-headedness, numbness and headaches.  Hematological: Negative for adenopathy. Does not bruise/bleed easily.  Psychiatric/Behavioral: Negative for hallucinations, behavioral problems, confusion, dysphoric mood, decreased concentration and agitation.    Objective:   Filed Vitals:   09/20/13 1723  BP: 136/84  Pulse: 88  Temp: 98.9 F (37.2 C)  Resp: 14    Physical Exam  Constitutional: Appears well-developed and well-nourished. No distress.  HENT: Normocephalic. External right and left ear normal. Oropharynx is clear and moist.  Eyes: Conjunctivae and EOM are normal. PERRLA, no scleral icterus.  Neck: Normal ROM. Neck supple. No JVD. No tracheal deviation. No thyromegaly.  CVS: RRR, S1/S2 +, no murmurs, no gallops, no carotid bruit.  Pulmonary: Effort and breath sounds normal, no stridor, rhonchi, wheezes, rales.  Abdominal: Soft. BS +,  no distension, tenderness, rebound or guarding.  Musculoskeletal: Normal range of motion.  No edema and no tenderness.  Lymphadenopathy: No lymphadenopathy noted, cervical, inguinal. Neuro: Alert. Normal reflexes, muscle tone coordination. No cranial nerve deficit. Skin: Skin is warm and dry. No rash noted. Not diaphoretic. No erythema. No pallor.  Psychiatric: Normal mood and affect. Behavior, judgment, thought content normal.   Lab Results  Component Value Date   WBC 7.4 03/31/2013   HGB 15.6 03/31/2013   HCT 45.3 03/31/2013   MCV 81.2 03/31/2013   PLT 324 03/31/2013   Lab Results  Component Value Date   CREATININE 0.60 05/27/2013   BUN 14 05/27/2013   NA 138 05/27/2013   K 3.7 05/27/2013   CL 101 05/27/2013   CO2 25 05/27/2013    Lab Results  Component Value Date   HGBA1C 12.7 09/20/2013   Lipid Panel     Component Value Date/Time   CHOL 154 01/17/2010 0000   TRIG 334.0* 01/17/2010 0000   HDL 32.20* 01/17/2010 0000   CHOLHDL 5 01/17/2010 0000   VLDL 66.8* 01/17/2010 0000       Assessment and plan:   Patient Active Problem List   Diagnosis Date Noted  . Diabetes 06/10/2013  . Essential hypertension, benign 06/10/2013  . Morbid obesity 06/10/2013  . Asthma, chronic 06/10/2013  . TOBACCO ABUSE 03/22/2010  . BACK PAIN 03/22/2010  . ASTHMA 02/03/2010  . DIABETES MELLITUS, TYPE II 01/17/2010  . HYPERLIPIDEMIA 01/17/2010  . HYPERTENSION 01/17/2010  . SLEEP APNEA 01/17/2010       Diabetes, type II, A1c 12.7 Uncontrolled The patient on Novolin 70/30 , 20 units twice a day Prescribe glucometer, glucometer strips, Follow up in 2-3 weeks    The patient was given clear instructions to go to ER or return to medical center if symptoms don't improve, worsen or new problems develop. The patient verbalized understanding. The patient was told to call to get any lab results if not heard anything in the next week.

## 2013-09-20 NOTE — Progress Notes (Signed)
Pt is here for a f/u for diabetes, hypertension and medication management. Needs a A1c completed.

## 2013-09-20 NOTE — Addendum Note (Signed)
Addended by: Susie Cassette MD, Germain Osgood on: 09/20/2013 06:10 PM   Modules accepted: Orders

## 2013-09-21 ENCOUNTER — Telehealth: Payer: Self-pay | Admitting: *Deleted

## 2013-09-21 MED ORDER — GEMFIBROZIL 600 MG PO TABS: 600.0000 mg | ORAL_TABLET | Freq: Two times a day (BID) | ORAL | Status: DC

## 2013-09-21 NOTE — Telephone Encounter (Signed)
Message copied by Takota Cahalan, [place] R on Tue Sep 21, 2013 11:20 AM ------      Message from: Susie Cassette MD, [facility]      Created: Tue Sep 21, 2013 10:40 AM       He is notify the patient of the patient's triglycerides are extremely high at 524,call in a prescription for Lopid 600 mg twice a day, 30 day supply with 6 refills ------

## 2013-09-21 NOTE — Telephone Encounter (Signed)
Pt complains that he never received his inhaler.

## 2013-09-21 NOTE — Telephone Encounter (Signed)
Message copied by Yamilet Mcfayden, [place] R on Tue Sep 21, 2013 11:19 AM ------      Message from: Susie Cassette MD, [facility]      Created: Tue Sep 21, 2013 10:40 AM       He is notify the patient of the patient's triglycerides are extremely high at 524,call in a prescription for Lopid 600 mg twice a day, 30 day supply with 6 refills ------

## 2013-09-27 ENCOUNTER — Encounter: Payer: Self-pay | Admitting: Internal Medicine

## 2013-09-27 ENCOUNTER — Ambulatory Visit: Payer: Managed Care, Other (non HMO) | Attending: Internal Medicine | Admitting: Internal Medicine

## 2013-09-27 VITALS — BP 138/88 | HR 85 | Temp 98.7°F | Resp 14 | Ht 69.0 in | Wt 324.6 lb

## 2013-09-27 DIAGNOSIS — R112 Nausea with vomiting, unspecified: Secondary | ICD-10-CM | POA: Insufficient documentation

## 2013-09-27 DIAGNOSIS — E119 Type 2 diabetes mellitus without complications: Secondary | ICD-10-CM

## 2013-09-27 LAB — POCT URINALYSIS DIPSTICK
BILIRUBIN UA: NEGATIVE
GLUCOSE UA: 500
Ketones, UA: NEGATIVE
LEUKOCYTES UA: NEGATIVE
NITRITE UA: NEGATIVE
Protein, UA: 100
RBC UA: NEGATIVE
Spec Grav, UA: >=1.03
Urobilinogen, UA: 0.2
pH, UA: 6

## 2013-09-27 LAB — COMPLETE METABOLIC PANEL WITH GFR
ALK PHOS: 97 U/L (ref 39–117)
ALT: 26 U/L (ref 0–53)
AST: 16 U/L (ref 0–37)
Albumin: 4.1 g/dL (ref 3.5–5.2)
BILIRUBIN TOTAL: 0.7 mg/dL (ref 0.3–1.2)
BUN: 9 mg/dL (ref 6–23)
CO2: 29 mEq/L (ref 19–32)
CREATININE: 0.55 mg/dL (ref 0.50–1.35)
Calcium: 9.3 mg/dL (ref 8.4–10.5)
Chloride: 99 mEq/L (ref 96–112)
GFR, Est Non African American: >89 mL/min
Glucose, Bld: 220 mg/dL — ABNORMAL HIGH (ref 70–99)
Potassium: 4.2 mEq/L (ref 3.5–5.3)
Sodium: 135 mEq/L (ref 135–145)
Total Protein: 7.4 g/dL (ref 6.0–8.3)

## 2013-09-27 LAB — CBC WITH DIFFERENTIAL/PLATELET
Basophils Absolute: 0.1 10*3/uL (ref 0.0–0.1)
Basophils Relative: 1 % (ref 0–1)
EOS PCT: 4 % (ref 0–5)
Eosinophils Absolute: 0.4 10*3/uL (ref 0.0–0.7)
HEMATOCRIT: 43.7 % (ref 39.0–52.0)
HEMOGLOBIN: 14.9 g/dL (ref 13.0–17.0)
LYMPHS ABS: 3.4 10*3/uL (ref 0.7–4.0)
LYMPHS PCT: 34 % (ref 12–46)
MCH: 27.4 pg (ref 26.0–34.0)
MCHC: 34.1 g/dL (ref 30.0–36.0)
MCV: 80.3 fL (ref 78.0–100.0)
MONOS PCT: 6 % (ref 3–12)
Monocytes Absolute: 0.6 10*3/uL (ref 0.1–1.0)
Neutro Abs: 5.5 10*3/uL (ref 1.7–7.7)
Neutrophils Relative %: 55 % (ref 43–77)
PLATELETS: 388 10*3/uL (ref 150–400)
RBC: 5.44 MIL/uL (ref 4.22–5.81)
RDW: 14.1 % (ref 11.5–15.5)
WBC: 10 10*3/uL (ref 4.0–10.5)

## 2013-09-27 LAB — GLUCOSE, POCT (MANUAL RESULT ENTRY): POC GLUCOSE: 207 mg/dL — AB (ref 70–99)

## 2013-09-27 LAB — LIPASE: Lipase: 15 U/L (ref 0–75)

## 2013-09-27 MED ORDER — ONDANSETRON HCL 4 MG/5ML PO SOLN: 8.0000 mg | Freq: Once | ORAL | Status: DC

## 2013-09-27 MED ORDER — ONDANSETRON HCL 4 MG PO TABS: 4.0000 mg | ORAL_TABLET | Freq: Three times a day (TID) | ORAL | Status: DC | PRN

## 2013-09-27 NOTE — Progress Notes (Signed)
Patient ID: Michael Foster, male   DOB: 05-11-1976, 38 y.o.   MRN: [id number]   CC:  HPI: 38 year old presents with a history of nausea vomiting for the last 2 days. The patient has not taken his insulin since 1/3 His CBGs have been in the 200 range. He was afraid to take his insulin as he was not eating enough. He however states that he has been able to keep himself hydrated despite the nausea. And has drank several gallons of water yesterday. He has been eating in several different restaurants. He skipped his breakfast and had raw salad  for lunch today No reported fever. Denies any dizziness, diarrhea   Allergies  Allergen Reactions  . Slo-Bid Gyrocaps [Theophylline]     Unknown   . Theophyllines     Parents never to use. Unknown reaction.   Past Medical History  Diagnosis Date  . Asthma   . Diabetes mellitus   . Hypertension   . Anxiety   . Vertigo    Current Outpatient Prescriptions on File Prior to Visit  Medication Sig Dispense Refill  . albuterol (PROVENTIL HFA;VENTOLIN HFA) 108 (90 BASE) MCG/ACT inhaler Inhale 2 puffs into the lungs every 6 (six) hours as needed for wheezing or shortness of breath. As needed for shortness of breath.  1 Inhaler  2  . aspirin EC 81 MG tablet Take 81 mg by mouth daily.      Marland Kitchen bismuth subsalicylate (PEPTO BISMOL) 262 MG/15ML suspension Take 15 mLs by mouth once as needed for indigestion.      . enalapril (VASOTEC) 10 MG tablet Take 1 tablet (10 mg total) by mouth daily.  90 tablet  2  . gemfibrozil (LOPID) 600 MG tablet Take 1 tablet (600 mg total) by mouth 2 (two) times daily before a meal.  30 tablet  6  . glucose blood test strip Use as instructed  100 each  12  . glucose monitoring kit (FREESTYLE) monitoring kit 1 each by Does not apply route as needed for other.  1 each  2  . insulin NPH-regular (NOVOLIN 70/30) (70-30) 100 UNIT/ML injection Inject 20 Units into the skin 2 (two) times daily with a meal.  10 mL  12  . Insulin Syringes,  Disposable, U-100 0.5 ML MISC 200  200 each  10  . Lancets (FREESTYLE) lancets Use as instructed  100 each  12  . metFORMIN (GLUCOPHAGE) 850 MG tablet Take 1 tablet (850 mg total) by mouth 2 (two) times daily with a meal.  120 tablet  2   No current facility-administered medications on file prior to visit.   Family History  Problem Relation Age of Onset  . COPD Father   . Hypertension Father   . Heart failure Father   . Diabetes Father   . Sleep apnea Brother    History   Social History  . Marital Status: Married    Spouse Name: N/A    Number of Children: 2  . Years of Education: N/A   Occupational History  . IT    Social History Main Topics  . Smoking status: Former Smoker -- 0.30 packs/day for 10 years    Types: Cigarettes    Quit date: 05/28/2013  . Smokeless tobacco: Never Used  . Alcohol Use: No  . Drug Use: No  . Sexual Activity: Not on file   Other Topics Concern  . Not on file   Social History Narrative  . No narrative on file  Review of Systems  Constitutional: As in history of present illness ds, congestion, facial swelling, rhinorrhea, neck pain, neck stiffness and ear discharge.   Eyes: Negative for pain, discharge, redness, itching and visual disturbance.  Respiratory: Negative for cough, choking, chest tightness, shortness of breath, wheezing and stridor.   Cardiovascular: Negative for chest pain, palpitations and leg swelling.  Gastrointestinal: Negative for abdominal distention.  Genitourinary: Negative for dysuria, urgency, frequency, hematuria, flank pain, decreased urine volume, difficulty urinating and dyspareunia.  Musculoskeletal: Negative for back pain, joint swelling, arthralgias and gait problem.  Neurological: Negative for dizziness, tremors, seizures, syncope, facial asymmetry, speech difficulty, weakness, light-headedness, numbness and headaches.  Hematological: Negative for adenopathy. Does not bruise/bleed easily.   Psychiatric/Behavioral: Negative for hallucinations, behavioral problems, confusion, dysphoric mood, decreased concentration and agitation.    Objective:   Filed Vitals:   09/27/13 1640  BP: 138/88  Pulse: 85  Temp: 98.7 F (37.1 C)  Resp: 14    Physical Exam  Constitutional: Appears well-developed and well-nourished. No distress.  HENT: Normocephalic. External right and left ear normal. Oropharynx is clear and moist.  Eyes: Conjunctivae and EOM are normal. PERRLA, no scleral icterus.  Neck: Normal ROM. Neck supple. No JVD. No tracheal deviation. No thyromegaly.  CVS: RRR, S1/S2 +, no murmurs, no gallops, no carotid bruit.  Pulmonary: Effort and breath sounds normal, no stridor, rhonchi, wheezes, rales.  Abdominal: Soft. BS +,  no distension, tenderness, rebound or guarding.  Musculoskeletal: Normal range of motion. No edema and no tenderness.  Lymphadenopathy: No lymphadenopathy noted, cervical, inguinal. Neuro: Alert. Normal reflexes, muscle tone coordination. No cranial nerve deficit. Skin: Skin is warm and dry. No rash noted. Not diaphoretic. No erythema. No pallor.  Psychiatric: Normal mood and affect. Behavior, judgment, thought content normal.   Lab Results  Component Value Date   WBC 7.4 03/31/2013   HGB 15.6 03/31/2013   HCT 45.3 03/31/2013   MCV 81.2 03/31/2013   PLT 324 03/31/2013   Lab Results  Component Value Date   CREATININE 0.60 05/27/2013   BUN 14 05/27/2013   NA 138 05/27/2013   K 3.7 05/27/2013   CL 101 05/27/2013   CO2 25 05/27/2013    Lab Results  Component Value Date   HGBA1C 12.7 09/20/2013   Lipid Panel     Component Value Date/Time   CHOL 209* 09/20/2013 1805   TRIG 524* 09/20/2013 1805   HDL 30* 09/20/2013 1805   CHOLHDL 7.0 09/20/2013 1805   VLDL NOT CALC 09/20/2013 1805   LDLCALC Comment:   Not calculated due to Triglyceride >400. Suggest ordering Direct LDL (Unit Code: 424-031-0022).   Total Cholesterol/HDL Ratio:CHD Risk                        Coronary  Heart Disease Risk Table                                        Men       Women          1/2 Average Risk              3.4        3.3              Average Risk              5.0  4.4           2X Average Risk              9.6        7.1           3X Average Risk             23.4       11.0 Use the calculated Patient Ratio above and the CHD Risk table  to determine the patient's CHD Risk. ATP III Classification (LDL):       < 100        mg/dL         Optimal      100 - 129     mg/dL         Near or Above Optimal      130 - 159     mg/dL         Borderline High      160 - 189     mg/dL         High       > 190        mg/dL         Very High   09/20/2013 1805       Assessment and plan:   Patient Active Problem List   Diagnosis Date Noted  . Diabetes 06/10/2013  . Essential hypertension, benign 06/10/2013  . Morbid obesity 06/10/2013  . Asthma, chronic 06/10/2013  . TOBACCO ABUSE 03/22/2010  . BACK PAIN 03/22/2010  . ASTHMA 02/03/2010  . DIABETES MELLITUS, TYPE II 01/17/2010  . HYPERLIPIDEMIA 01/17/2010  . HYPERTENSION 01/17/2010  . SLEEP APNEA 01/17/2010   Nausea vomiting Patient able to keep liquids down Denies any abdominal pain, diarrhea Advised the patient to hold off on metformin. Never to skip his insulin as long as he's able to keep fluids down He has been instructed to go to the ED if he has worsening nausea vomiting, dizziness, diarrhea, fever, elevated CBG He has been instructed to check his CBG every 4 hours We'll recheck his labs including CMP, lipase, CBC, UA We'll have him followup closely within 2-3 days Patient has been strongly recommended to go to the ED if he is not able to hydrate himself adequately      The patient was given clear instructions to go to ER or return to medical center if symptoms don't improve, worsen or new problems develop. The patient verbalized understanding. The patient was told to call to get any lab results if not heard anything in the next  week.

## 2013-09-27 NOTE — Progress Notes (Signed)
Pt is here for a f/u. Complains of nausea, vomiting, signs of fainting, little appetite, only drinking a lot of water, x2 days on and off. Has not taken any medication due to the sick feeling. Having to use inhaler a lot. Nausea today.

## 2013-10-07 ENCOUNTER — Encounter (HOSPITAL_BASED_OUTPATIENT_CLINIC_OR_DEPARTMENT_OTHER): Payer: Self-pay | Admitting: Emergency Medicine

## 2013-10-07 ENCOUNTER — Emergency Department (HOSPITAL_BASED_OUTPATIENT_CLINIC_OR_DEPARTMENT_OTHER)
Admission: EM | Admit: 2013-10-07 | Discharge: 2013-10-07 | Disposition: A | Discharge status: Home / Self Care | Payer: Managed Care, Other (non HMO) | Attending: Emergency Medicine | Admitting: Emergency Medicine

## 2013-10-07 DIAGNOSIS — S335XXA Sprain of ligaments of lumbar spine, initial encounter: Secondary | ICD-10-CM | POA: Insufficient documentation

## 2013-10-07 DIAGNOSIS — J45909 Unspecified asthma, uncomplicated: Secondary | ICD-10-CM | POA: Insufficient documentation

## 2013-10-07 DIAGNOSIS — Z87891 Personal history of nicotine dependence: Secondary | ICD-10-CM | POA: Insufficient documentation

## 2013-10-07 DIAGNOSIS — Z7982 Long term (current) use of aspirin: Secondary | ICD-10-CM | POA: Insufficient documentation

## 2013-10-07 DIAGNOSIS — Z794 Long term (current) use of insulin: Secondary | ICD-10-CM | POA: Insufficient documentation

## 2013-10-07 DIAGNOSIS — E119 Type 2 diabetes mellitus without complications: Secondary | ICD-10-CM | POA: Insufficient documentation

## 2013-10-07 DIAGNOSIS — S39012A Strain of muscle, fascia and tendon of lower back, initial encounter: Secondary | ICD-10-CM

## 2013-10-07 DIAGNOSIS — Z8659 Personal history of other mental and behavioral disorders: Secondary | ICD-10-CM | POA: Insufficient documentation

## 2013-10-07 DIAGNOSIS — I10 Essential (primary) hypertension: Secondary | ICD-10-CM | POA: Insufficient documentation

## 2013-10-07 DIAGNOSIS — X58XXXA Exposure to other specified factors, initial encounter: Secondary | ICD-10-CM | POA: Insufficient documentation

## 2013-10-07 DIAGNOSIS — Z79899 Other long term (current) drug therapy: Secondary | ICD-10-CM | POA: Insufficient documentation

## 2013-10-07 DIAGNOSIS — Y929 Unspecified place or not applicable: Secondary | ICD-10-CM | POA: Insufficient documentation

## 2013-10-07 DIAGNOSIS — Y939 Activity, unspecified: Secondary | ICD-10-CM | POA: Insufficient documentation

## 2013-10-07 LAB — URINALYSIS, ROUTINE W REFLEX MICROSCOPIC
Glucose, UA: 500 mg/dL — AB
HGB URINE DIPSTICK: NEGATIVE
Ketones, ur: 15 mg/dL — AB
Leukocytes, UA: NEGATIVE
NITRITE: NEGATIVE
Protein, ur: 30 mg/dL — AB
SPECIFIC GRAVITY, URINE: 1.041 — AB (ref 1.005–1.030)
UROBILINOGEN UA: 1 mg/dL (ref 0.0–1.0)
pH: 5.5 (ref 5.0–8.0)

## 2013-10-07 LAB — URINE MICROSCOPIC-ADD ON

## 2013-10-07 MED ORDER — ALBUTEROL SULFATE HFA 108 (90 BASE) MCG/ACT IN AERS: 1.0000 | INHALATION_SPRAY | Freq: Four times a day (QID) | RESPIRATORY_TRACT | Status: DC | PRN

## 2013-10-07 MED ORDER — MELOXICAM 7.5 MG PO TABS: 7.5000 mg | ORAL_TABLET | Freq: Every day | ORAL | Status: DC

## 2013-10-07 MED ORDER — METHOCARBAMOL 500 MG PO TABS: 500.0000 mg | ORAL_TABLET | Freq: Two times a day (BID) | ORAL | Status: DC

## 2013-10-07 NOTE — ED Notes (Signed)
Lower back pain today. Worse on the left flank. No known injury.

## 2013-10-07 NOTE — Discharge Instructions (Signed)
Back Pain, Adult  Back pain is very common. The pain often gets better over time. The cause of back pain is usually not dangerous. Most people can learn to manage their back pain on their own.   HOME CARE   · Stay active. Start with short walks on flat ground if you can. Try to walk farther each day.  · Do not sit, drive, or stand in one place for more than 30 minutes. Do not stay in bed.  · Do not avoid exercise or work. Activity can help your back heal faster.  · Be careful when you bend or lift an object. Bend at your knees, keep the object close to you, and do not twist.  · Sleep on a firm mattress. Lie on your side, and bend your knees. If you lie on your back, put a pillow under your knees.  · Only take medicines as told by your doctor.  · Put ice on the injured area.  · Put ice in a plastic bag.  · Place a towel between your skin and the bag.  · Leave the ice on for 15-20 minutes, 03-04 times a day for the first 2 to 3 days. After that, you can switch between ice and heat packs.  · Ask your doctor about back exercises or massage.  · Avoid feeling anxious or stressed. Find good ways to deal with stress, such as exercise.  GET HELP RIGHT AWAY IF:   · Your pain does not go away with rest or medicine.  · Your pain does not go away in 1 week.  · You have new problems.  · You do not feel well.  · The pain spreads into your legs.  · You cannot control when you poop (bowel movement) or pee (urinate).  · Your arms or legs feel weak or lose feeling (numbness).  · You feel sick to your stomach (nauseous) or throw up (vomit).  · You have belly (abdominal) pain.  · You feel like you may pass out (faint).  MAKE SURE YOU:   · Understand these instructions.  · Will watch your condition.  · Will get help right away if you are not doing well or get worse.  Document Released: 02/26/2008 Document Revised: 12/02/2011 Document Reviewed: 01/28/2011  [employer]® Patient Information ©2014 [employer].

## 2013-10-07 NOTE — ED Provider Notes (Signed)
CSN: [id number]     Arrival date & time 10/07/13  1654 History   First MD Initiated Contact with Patient 10/07/13 1714     Chief Complaint  Patient presents with  . Back Pain   (Consider location/radiation/quality/duration/timing/severity/associated sxs/prior Treatment) Patient is a 38 y.o. male presenting with back pain. The history is provided by the patient.  Back Pain Location:  Lumbar spine Quality:  Aching Radiates to:  Does not radiate Pain severity:  Moderate Pain is:  Worse during the day Onset quality:  Gradual Duration:  1 day Timing:  Constant Progression:  Worsening Relieved by:  None tried Associated symptoms: no abdominal pain, no chest pain, no dysuria, no fever and no headaches   PAULETTE LYNCH is a 38 y.o. male who presents to the ED with low back pain that started today. He has had a history of back pain in the past but has been fine for several months. The pain is worse with sitting for a long time behind a computer. He denies UTI symptoms. He does have left flank pain that is worse with movement.  He also request refill of his Albuterol Inhaler  Past Medical History  Diagnosis Date  . Asthma   . Diabetes mellitus   . Hypertension   . Anxiety   . Vertigo    History reviewed. No pertinent past surgical history. Family History  Problem Relation Age of Onset  . COPD Father   . Hypertension Father   . Heart failure Father   . Diabetes Father   . Sleep apnea Brother    History  Substance Use Topics  . Smoking status: Former Smoker -- 0.30 packs/day for 10 years    Types: Cigarettes    Quit date: 05/28/2013  . Smokeless tobacco: Never Used  . Alcohol Use: No    Review of Systems  Constitutional: Negative for fever and chills.  HENT: Negative.   Eyes: Negative for visual disturbance.  Respiratory: Negative for cough, chest tightness and shortness of breath.   Cardiovascular: Negative for chest pain.  Gastrointestinal: Negative for nausea, vomiting  and abdominal pain.  Genitourinary: Negative for dysuria, urgency and frequency.  Musculoskeletal: Positive for back pain.  Skin: Negative for rash.  Neurological: Negative for syncope and headaches.  Psychiatric/Behavioral: Negative for confusion. The patient is not nervous/anxious.     Allergies  Slo-bid gyrocaps and Theophyllines  Home Medications   Current Outpatient Rx  Name  Route  Sig  Dispense  Refill  . albuterol (PROVENTIL HFA;VENTOLIN HFA) 108 (90 BASE) MCG/ACT inhaler   Inhalation   Inhale 2 puffs into the lungs every 6 (six) hours as needed for wheezing or shortness of breath. As needed for shortness of breath.   1 Inhaler   2   . aspirin EC 81 MG tablet   Oral   Take 81 mg by mouth daily.         Marland Kitchen bismuth subsalicylate (PEPTO BISMOL) 262 MG/15ML suspension   Oral   Take 15 mLs by mouth once as needed for indigestion.         . enalapril (VASOTEC) 10 MG tablet   Oral   Take 1 tablet (10 mg total) by mouth daily.   90 tablet   2   . gemfibrozil (LOPID) 600 MG tablet   Oral   Take 1 tablet (600 mg total) by mouth 2 (two) times daily before a meal.   30 tablet   6   . glucose blood test  strip      Use as instructed   100 each   12   . glucose monitoring kit (FREESTYLE) monitoring kit   Does not apply   1 each by Does not apply route as needed for other.   1 each   2   . insulin NPH-regular (NOVOLIN 70/30) (70-30) 100 UNIT/ML injection   Subcutaneous   Inject 20 Units into the skin 2 (two) times daily with a meal.   10 mL   12   . Insulin Syringes, Disposable, U-100 0.5 ML MISC      200   200 each   10   . Lancets (FREESTYLE) lancets      Use as instructed   100 each   12   . metFORMIN (GLUCOPHAGE) 850 MG tablet   Oral   Take 1 tablet (850 mg total) by mouth 2 (two) times daily with a meal.   120 tablet   2   . ondansetron (ZOFRAN) 4 MG tablet   Oral   Take 1 tablet (4 mg total) by mouth every 8 (eight) hours as needed for  nausea or vomiting.   30 tablet   0   . ondansetron (ZOFRAN) 4 MG/5ML solution   Oral   Take 10 mLs (8 mg total) by mouth once.   50 mL   0    BP 140/79  Pulse 84  Temp(Src) 98.9 F (37.2 C) (Oral)  Resp 18  Ht '5\' 9"'  (1.753 m)  Wt 324 lb (146.965 kg)  BMI 47.82 kg/m2  SpO2 98% Physical Exam  Nursing note and vitals reviewed. Constitutional: He is oriented to person, place, and time. No distress.  Morbidly obese  HENT:  Head: Normocephalic and atraumatic.  Eyes: Conjunctivae and EOM are normal.  Neck: Normal range of motion. Neck supple.  Cardiovascular: Normal rate, regular rhythm and normal heart sounds.   Pulmonary/Chest: Effort normal and breath sounds normal.  Abdominal: Soft. Bowel sounds are normal. There is no tenderness.  Musculoskeletal: Normal range of motion.       Lumbar back: He exhibits tenderness and spasm. He exhibits no deformity, no laceration and normal pulse.       Back:  Neurological: He is alert and oriented to person, place, and time. He has normal strength and normal reflexes. No cranial nerve deficit or sensory deficit. Gait normal.  Skin: Skin is warm and dry.  Psychiatric: He has a normal mood and affect. His behavior is normal.    ED Course  Procedures (including critical care time) Labs Review Results for orders placed during the hospital encounter of 10/07/13 (from the past 24 hour(s))  URINALYSIS, ROUTINE W REFLEX MICROSCOPIC     Status: Abnormal   Collection Time    10/07/13  5:09 PM      Result Value Range   Color, Urine YELLOW  YELLOW   APPearance CLEAR  CLEAR   Specific Gravity, Urine 1.041 (*) 1.005 - 1.030   pH 5.5  5.0 - 8.0   Glucose, UA 500 (*) NEGATIVE mg/dL   Hgb urine dipstick NEGATIVE  NEGATIVE   Bilirubin Urine SMALL (*) NEGATIVE   Ketones, ur 15 (*) NEGATIVE mg/dL   Protein, ur 30 (*) NEGATIVE mg/dL   Urobilinogen, UA 1.0  0.0 - 1.0 mg/dL   Nitrite NEGATIVE  NEGATIVE   Leukocytes, UA NEGATIVE  NEGATIVE  URINE  MICROSCOPIC-ADD ON     Status: Abnormal   Collection Time    10/07/13  5:09  PM      Result Value Range   Squamous Epithelial / LPF RARE  RARE   WBC, UA 0-2  <3 WBC/hpf   Bacteria, UA FEW (*) RARE   Urine-Other MUCOUS PRESENT       MDM  38 y.o. male with low back pain. Will treat with muscle relaxant and NSAIDS. Will also refill his inhaler Discussed with the patient clinical findings and plan of care. All questioned fully answered. He will return if any problems arise.    Medication List    TAKE these medications       meloxicam 7.5 MG tablet  Commonly known as:  MOBIC  Take 1 tablet (7.5 mg total) by mouth daily.     methocarbamol 500 MG tablet  Commonly known as:  ROBAXIN  Take 1 tablet (500 mg total) by mouth 2 (two) times daily.      ASK your doctor about these medications       albuterol 108 (90 BASE) MCG/ACT inhaler  Commonly known as:  PROVENTIL HFA;VENTOLIN HFA  Inhale 2 puffs into the lungs every 6 (six) hours as needed for wheezing or shortness of breath. As needed for shortness of breath.  Ask about: Which instructions should I use?     albuterol 108 (90 BASE) MCG/ACT inhaler  Commonly known as:  PROVENTIL HFA;VENTOLIN HFA  Inhale 1-2 puffs into the lungs every 6 (six) hours as needed for wheezing or shortness of breath.  Ask about: Which instructions should I use?     aspirin EC 81 MG tablet  Take 81 mg by mouth daily.     bismuth subsalicylate 448 JE/56DJ suspension  Commonly known as:  PEPTO BISMOL  Take 15 mLs by mouth once as needed for indigestion.     enalapril 10 MG tablet  Commonly known as:  VASOTEC  Take 1 tablet (10 mg total) by mouth daily.     freestyle lancets  Use as instructed     gemfibrozil 600 MG tablet  Commonly known as:  LOPID  Take 1 tablet (600 mg total) by mouth 2 (two) times daily before a meal.     glucose blood test strip  Use as instructed     glucose monitoring kit monitoring kit  1 each by Does not apply route as  needed for other.     insulin NPH-regular Human (70-30) 100 UNIT/ML injection  Commonly known as:  NOVOLIN 70/30  Inject 20 Units into the skin 2 (two) times daily with a meal.     Insulin Syringes (Disposable) U-100 0.5 ML Misc  200     metFORMIN 850 MG tablet  Commonly known as:  GLUCOPHAGE  Take 1 tablet (850 mg total) by mouth 2 (two) times daily with a meal.     ondansetron 4 MG/5ML solution  Commonly known as:  ZOFRAN  Take 10 mLs (8 mg total) by mouth once.     ondansetron 4 MG tablet  Commonly known as:  ZOFRAN  Take 1 tablet (4 mg total) by mouth every 8 (eight) hours as needed for nausea or vomiting.           [place], [place] 10/07/13 [phone]

## 2013-10-08 NOTE — ED Provider Notes (Signed)
Medical screening examination/treatment/procedure(s) were performed by non-physician practitioner and as supervising physician I was immediately available for consultation/collaboration.     Geoffery Lyonsouglas Brinkley Peet, MD 10/08/13 [phone]

## 2013-10-20 ENCOUNTER — Ambulatory Visit: Payer: Self-pay | Admitting: Internal Medicine

## 2013-11-11 ENCOUNTER — Other Ambulatory Visit: Payer: Self-pay | Admitting: Internal Medicine

## 2013-11-25 ENCOUNTER — Ambulatory Visit (INDEPENDENT_AMBULATORY_CARE_PROVIDER_SITE_OTHER): Payer: Managed Care, Other (non HMO) | Admitting: Adult Health

## 2013-11-25 ENCOUNTER — Encounter: Payer: Self-pay | Admitting: Adult Health

## 2013-11-25 ENCOUNTER — Ambulatory Visit: Payer: Self-pay | Admitting: Adult Health

## 2013-11-25 ENCOUNTER — Encounter (INDEPENDENT_AMBULATORY_CARE_PROVIDER_SITE_OTHER): Payer: Self-pay

## 2013-11-25 VITALS — BP 136/82 | HR 94 | Temp 98.6°F | Ht 69.0 in | Wt 341.2 lb

## 2013-11-25 DIAGNOSIS — G4730 Sleep apnea, unspecified: Secondary | ICD-10-CM

## 2013-11-25 MED ORDER — ALBUTEROL SULFATE HFA 108 (90 BASE) MCG/ACT IN AERS: INHALATION_SPRAY | RESPIRATORY_TRACT | Status: DC

## 2013-11-25 NOTE — Patient Instructions (Signed)
Continue on CPAP At bedtime  .  Continue to work on weight loss.  follow up Dr. Vassie LollAlva  In 4 months and As needed

## 2013-11-25 NOTE — Progress Notes (Signed)
Subjective:    Patient ID: Michael Foster, male    DOB: 1976-01-07, 38 y.o.   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  He reports asthma since childhood, never hospitalised, last urgent care visit 2 yrs ago, worse during spring, prefers primatene mist to albuterol mdi (less expensive).  He was diagnosed with severe obstructive sleep apnea after a sleep study  in 2005 & has been on cpap ever since, cannot tolerate a single night off it.    08/27/2013 3y FU  Chief Complaint  Patient presents with  . Sleep Consult    Here to re-establish. Pt last seen 2011. He is still wearing CPAP everynight. Pt is requetsing to have pressure adjusted.    Baseline PSG at [facility] 10/04 , wt 352, BMI 53 showed RDI 30/h, corrected by CPAP 9 cm  CPAP currently set at 14 cm, PSG showed this level corrects events but needs 19 cm to abolish snoring,   Spirometry FEv1 50%, pos BD response  Bedtime around 11 PM, sleep latency is variable, sleeps on his side with 2 pillows, reports one nocturnal awakening and is out of bed at 6 AM feeling rested without dryness of mouth. He is lost 30 pounds since the sleep study. His CPAP machine is very old and is set at 14 cm apparently he also has his brother's CPAP machine which would like to be set. He has full face mask but has not received supplies in 3 years. He is using electronic cigarettes for 3 months after an ER visit for uncontrolled diabetes >>no changes   11/25/2013 Follow up  Pt returns for  3 month follow up [facility]  Reports sleep has been well. Wearing CPAP every night x6-7 hours. Loves his CPAP "could not live without it"  No issue with mask .  Discussed wt loss,.  No daytime sleepiness Denies chest pain , orthopnea , edema or fever.  Not smoking cigs, still using vapor cigs- advised may have hidden dangers.   On ACE - denies cough.    Review of  Systems Constitutional:   No  weight loss, night sweats,  Fevers, chills, fatigue, or  lassitude.  HEENT:   No headaches,  Difficulty swallowing,  Tooth/dental problems, or  Sore throat,                No sneezing, itching, ear ache, nasal congestion, post nasal drip,   CV:  No chest pain,  Orthopnea, PND, swelling in lower extremities, anasarca, dizziness, palpitations, syncope.   GI  No heartburn, indigestion, abdominal pain, nausea, vomiting, diarrhea, change in bowel habits, loss of appetite, bloody stools.   Resp: No shortness of breath with exertion or at rest.  No excess mucus, no productive cough,  No non-productive cough,  No coughing up of blood.  No change in color of mucus.  No wheezing.  No chest wall deformity  Skin: no rash or lesions.  GU: no dysuria, change in color of urine, no urgency or frequency.  No flank pain, no hematuria   MS:  No joint pain or swelling.  No decreased range of motion.  No back pain.  Psych:  No change in mood or affect. No depression or anxiety.  No memory loss.         Objective:   Physical Exam GEN: A/Ox3; pleasant , NAD, obese    HEENT:  [place]/AT,  EACs-clear,  TMs-wnl, NOSE-clear, THROAT-clear, no lesions, no postnasal drip or exudate noted.   NECK:  Supple w/ fair ROM; no JVD; normal carotid impulses w/o bruits; no thyromegaly or nodules palpated; no lymphadenopathy.  RESP  Clear  P & A; w/o, wheezes/ rales/ or rhonchi.no accessory muscle use, no dullness to percussion  CARD:  RRR, no m/r/g  , no peripheral edema, pulses intact, no cyanosis or clubbing.  GI:   Soft & nt; nml bowel sounds; no organomegaly or masses detected.  Musco: Warm bil, no deformities or joint swelling noted.   Neuro: alert, no focal deficits noted.    Skin: Warm, no lesions or rashes         Assessment & Plan:

## 2013-11-29 NOTE — Assessment & Plan Note (Signed)
Doing well on CPAP   Plan  Continue on CPAP At bedtime  .  Continue to work on weight loss.  follow up Dr. Vassie LollAlva  In 4 months and As needed

## 2014-01-02 ENCOUNTER — Encounter (HOSPITAL_BASED_OUTPATIENT_CLINIC_OR_DEPARTMENT_OTHER): Payer: Self-pay | Admitting: Emergency Medicine

## 2014-01-02 ENCOUNTER — Emergency Department (HOSPITAL_BASED_OUTPATIENT_CLINIC_OR_DEPARTMENT_OTHER)
Admission: EM | Admit: 2014-01-02 | Discharge: 2014-01-02 | Disposition: A | Discharge status: Home / Self Care | Payer: Managed Care, Other (non HMO) | Attending: Emergency Medicine | Admitting: Emergency Medicine

## 2014-01-02 DIAGNOSIS — Z87891 Personal history of nicotine dependence: Secondary | ICD-10-CM | POA: Insufficient documentation

## 2014-01-02 DIAGNOSIS — Z79899 Other long term (current) drug therapy: Secondary | ICD-10-CM | POA: Insufficient documentation

## 2014-01-02 DIAGNOSIS — Z8659 Personal history of other mental and behavioral disorders: Secondary | ICD-10-CM | POA: Insufficient documentation

## 2014-01-02 DIAGNOSIS — Z7982 Long term (current) use of aspirin: Secondary | ICD-10-CM | POA: Insufficient documentation

## 2014-01-02 DIAGNOSIS — IMO0002 Reserved for concepts with insufficient information to code with codable children: Secondary | ICD-10-CM | POA: Insufficient documentation

## 2014-01-02 DIAGNOSIS — I10 Essential (primary) hypertension: Secondary | ICD-10-CM | POA: Insufficient documentation

## 2014-01-02 DIAGNOSIS — J45901 Unspecified asthma with (acute) exacerbation: Secondary | ICD-10-CM | POA: Insufficient documentation

## 2014-01-02 DIAGNOSIS — E119 Type 2 diabetes mellitus without complications: Secondary | ICD-10-CM | POA: Insufficient documentation

## 2014-01-02 DIAGNOSIS — Z794 Long term (current) use of insulin: Secondary | ICD-10-CM | POA: Insufficient documentation

## 2014-01-02 MED ORDER — ALBUTEROL SULFATE HFA 108 (90 BASE) MCG/ACT IN AERS: 1.0000 | INHALATION_SPRAY | Freq: Four times a day (QID) | RESPIRATORY_TRACT | Status: DC | PRN

## 2014-01-02 MED ORDER — PREDNISONE 50 MG PO TABS
60.0000 mg | ORAL_TABLET | Freq: Once | ORAL | Status: AC
  Administered 2014-01-02: 60 mg via ORAL
  Filled 2014-01-02 (×2): qty 1

## 2014-01-02 MED ORDER — PREDNISONE 20 MG PO TABS: ORAL_TABLET | ORAL | Status: DC

## 2014-01-02 MED ORDER — ALBUTEROL SULFATE (2.5 MG/3ML) 0.083% IN NEBU
2.5000 mg | INHALATION_SOLUTION | Freq: Once | RESPIRATORY_TRACT | Status: AC
  Administered 2014-01-02: 2.5 mg via RESPIRATORY_TRACT
  Filled 2014-01-02: qty 3

## 2014-01-02 NOTE — ED Provider Notes (Signed)
CSN: [id number]     Arrival date & time 01/02/14  0321 History   First MD Initiated Contact with Patient 01/02/14 0348     Chief Complaint  Patient presents with  . allergy symptoms      (Consider location/radiation/quality/duration/timing/severity/associated sxs/prior Treatment) Patient is a 38 y.o. male presenting with wheezing. The history is provided by the patient.  Wheezing Severity:  Moderate Severity compared to prior episodes:  Similar Onset quality:  Gradual Timing:  Constant Progression:  Unchanged Chronicity:  Recurrent Context: exposure to allergen   Relieved by:  Nothing Worsened by:  Nothing tried Ineffective treatments:  Beta-agonist inhaler (allegra) Associated symptoms: no chest pain, no fever and no swollen glands   Risk factors: not exposed to toxic fumes     Past Medical History  Diagnosis Date  . Asthma   . Diabetes mellitus   . Hypertension   . Anxiety   . Vertigo    History reviewed. No pertinent past surgical history. Family History  Problem Relation Age of Onset  . COPD Father   . Hypertension Father   . Heart failure Father   . Diabetes Father   . Sleep apnea Brother    History  Substance Use Topics  . Smoking status: Former Smoker -- 0.30 packs/day for 10 years    Types: Cigarettes    Quit date: 05/28/2013  . Smokeless tobacco: Never Used  . Alcohol Use: No    Review of Systems  Constitutional: Negative for fever.  Respiratory: Positive for wheezing.   Cardiovascular: Negative for chest pain.  All other systems reviewed and are negative.     Allergies  Slo-bid gyrocaps and Theophyllines  Home Medications   Current Outpatient Rx  Name  Route  Sig  Dispense  Refill  . albuterol (PROVENTIL HFA;VENTOLIN HFA) 108 (90 BASE) MCG/ACT inhaler   Inhalation   Inhale 1-2 puffs into the lungs every 6 (six) hours as needed for wheezing or shortness of breath.   1 Inhaler   0   . albuterol (VENTOLIN HFA) 108 (90 BASE) MCG/ACT  inhaler      INHALE 2 PUFFS INTO THE LUNGS EVERY 4 HOURS AS NEEDED FOR WHEEZING OR SHORTNESS OF BREATH.   8.5 g   5   . aspirin EC 81 MG tablet   Oral   Take 81 mg by mouth daily.         . enalapril (VASOTEC) 10 MG tablet   Oral   Take 1 tablet (10 mg total) by mouth daily.   90 tablet   2   . glucose blood test strip      Use as instructed   100 each   12   . glucose monitoring kit (FREESTYLE) monitoring kit   Does not apply   1 each by Does not apply route as needed for other.   1 each   2   . insulin NPH-regular (NOVOLIN 70/30) (70-30) 100 UNIT/ML injection   Subcutaneous   Inject 20 Units into the skin 2 (two) times daily with a meal.   10 mL   12   . Insulin Syringes, Disposable, U-100 0.5 ML MISC      200   200 each   10   . Lancets (FREESTYLE) lancets      Use as instructed   100 each   12   . metFORMIN (GLUCOPHAGE) 850 MG tablet   Oral   Take 1 tablet (850 mg total) by mouth 2 (two) times  daily with a meal.   120 tablet   2   . predniSONE (DELTASONE) 20 MG tablet      3 tabs po day one, then 2 po daily x 4 days   11 tablet   0    BP 141/85  Pulse 92  Temp(Src) 98.7 F (37.1 C) (Oral)  Resp 20  Ht '5\' 9"'  (1.753 m)  Wt 322 lb (146.058 kg)  BMI 47.53 kg/m2  SpO2 97% Physical Exam  Constitutional: He is oriented to person, place, and time. He appears well-developed and well-nourished. No distress.  HENT:  Head: Normocephalic and atraumatic.  Mouth/Throat: Oropharynx is clear and moist.  Eyes: Conjunctivae are normal. Pupils are equal, round, and reactive to light.  Neck: Normal range of motion. Neck supple.  Cardiovascular: Normal rate and regular rhythm.   Pulmonary/Chest: No respiratory distress. He has decreased breath sounds. He has no wheezes. He has no rales.  Abdominal: Soft. Bowel sounds are normal. There is no tenderness. There is no rebound and no guarding.  Musculoskeletal: Normal range of motion.  Neurological: He is  alert and oriented to person, place, and time.  Skin: Skin is warm and dry.  Psychiatric: He has a normal mood and affect.    ED Course  Procedures (including critical care time) Labs Review Labs Reviewed - No data to display Imaging Review No results found.   EKG Interpretation None      MDM   Final diagnoses:  Asthma attack    Albuterol neb with improved aeration post and prednisone.  Will start prednisone x 5 days.  Contact your physician about increasing your diabetes medication while on steroids.  Return for any worsening of symptoms.  Continue allegra    Keano Guggenheim Alfonso Patten, MD 01/02/14 [phone]

## 2014-01-02 NOTE — ED Notes (Signed)
C/o allergy symptoms for past couple days. States hx of asthma. Has used allegra one times since Friday. Pt used his inhaler six times pta. Pt arrives in no resp distress. States lungs feel "tight" no wheezing noted.

## 2014-01-02 NOTE — Discharge Instructions (Signed)

## 2014-02-28 ENCOUNTER — Telehealth: Payer: Self-pay | Admitting: Internal Medicine

## 2014-02-28 NOTE — Telephone Encounter (Signed)
Pt has come in today with some questions about his medication; pt has been told by his insurance company that they would like his medications to be 90-days instead of the standard 30; pt is three days before he runs out of his insulin however his lisinopril is completely out; please f/u with pt as soon as possible to prevent any complications; pt has [employer];

## 2014-03-01 ENCOUNTER — Other Ambulatory Visit: Payer: Self-pay | Admitting: Emergency Medicine

## 2014-03-24 ENCOUNTER — Telehealth: Payer: Self-pay | Admitting: Adult Health

## 2014-03-24 NOTE — Telephone Encounter (Signed)
Letter from insurance that is over using his SABA inhaler  Not on controller for asthma  Needs ov with Dr. Vassie LollAlva  With PFT to discuss asthma  Please make him an ov to discuss  Recent ER visit.   Please contact office for sooner follow up if symptoms do not improve or worsen or seek emergency care

## 2014-03-24 NOTE — Telephone Encounter (Signed)
Called and spoke with pt and he stated that he has a new PCP now ---Dr. Nolene Ebbs at [facility].  He stated that he now has a new insurance and that they required him to fill his meds for 90 day supply each time.  He stated that his new PCP refilled all of his meds and that he is not using the rescue inhaler or any of his other meds more often that what he did before, he is just getting a 90 day supply now.  He is requesting that he not have to come in for an appt at this time since he does not have the money for a visit now.  Pt is aware that i will send this back to TP to make her aware and see what she recs.  Please advise., thanks  Allergies  Allergen Reactions  . Slo-Bid Gyrocaps [Theophylline]     Unknown   . Theophyllines     Parents never to use. Unknown reaction.    Current Outpatient Prescriptions on File Prior to Visit  Medication Sig Dispense Refill  . albuterol (PROVENTIL HFA;VENTOLIN HFA) 108 (90 BASE) MCG/ACT inhaler Inhale 1-2 puffs into the lungs every 6 (six) hours as needed for wheezing or shortness of breath.  1 Inhaler  0  . albuterol (VENTOLIN HFA) 108 (90 BASE) MCG/ACT inhaler INHALE 2 PUFFS INTO THE LUNGS EVERY 4 HOURS AS NEEDED FOR WHEEZING OR SHORTNESS OF BREATH.  8.5 g  5  . aspirin EC 81 MG tablet Take 81 mg by mouth daily.      . enalapril (VASOTEC) 10 MG tablet Take 1 tablet (10 mg total) by mouth daily.  90 tablet  2  . glucose blood test strip Use as instructed  100 each  12  . glucose monitoring kit (FREESTYLE) monitoring kit 1 each by Does not apply route as needed for other.  1 each  2  . insulin NPH-regular (NOVOLIN 70/30) (70-30) 100 UNIT/ML injection Inject 20 Units into the skin 2 (two) times daily with a meal.  10 mL  12  . Insulin Syringes, Disposable, U-100 0.5 ML MISC 200  200 each  10  . Lancets (FREESTYLE) lancets Use as instructed  100 each  12  . metFORMIN (GLUCOPHAGE) 850 MG tablet Take 1 tablet (850 mg total) by mouth 2 (two) times  daily with a meal.  120 tablet  2  . predniSONE (DELTASONE) 20 MG tablet 3 tabs po day one, then 2 po daily x 4 days  11 tablet  0   No current facility-administered medications on file prior to visit.

## 2014-06-28 ENCOUNTER — Emergency Department (HOSPITAL_COMMUNITY)
Admission: EM | Admit: 2014-06-28 | Discharge: 2014-06-28 | Disposition: A | Discharge status: Home / Self Care | Payer: Managed Care, Other (non HMO) | Attending: Emergency Medicine | Admitting: Emergency Medicine

## 2014-06-28 ENCOUNTER — Emergency Department (HOSPITAL_COMMUNITY): Payer: Managed Care, Other (non HMO)

## 2014-06-28 ENCOUNTER — Encounter (HOSPITAL_COMMUNITY): Payer: Self-pay | Admitting: Emergency Medicine

## 2014-06-28 DIAGNOSIS — Z7982 Long term (current) use of aspirin: Secondary | ICD-10-CM | POA: Diagnosis not present

## 2014-06-28 DIAGNOSIS — R110 Nausea: Secondary | ICD-10-CM | POA: Diagnosis present

## 2014-06-28 DIAGNOSIS — E669 Obesity, unspecified: Secondary | ICD-10-CM | POA: Diagnosis not present

## 2014-06-28 DIAGNOSIS — J45909 Unspecified asthma, uncomplicated: Secondary | ICD-10-CM | POA: Diagnosis not present

## 2014-06-28 DIAGNOSIS — E1165 Type 2 diabetes mellitus with hyperglycemia: Secondary | ICD-10-CM | POA: Insufficient documentation

## 2014-06-28 DIAGNOSIS — Z794 Long term (current) use of insulin: Secondary | ICD-10-CM | POA: Diagnosis not present

## 2014-06-28 DIAGNOSIS — Z87891 Personal history of nicotine dependence: Secondary | ICD-10-CM | POA: Diagnosis not present

## 2014-06-28 DIAGNOSIS — F419 Anxiety disorder, unspecified: Secondary | ICD-10-CM | POA: Insufficient documentation

## 2014-06-28 DIAGNOSIS — R1011 Right upper quadrant pain: Secondary | ICD-10-CM | POA: Diagnosis not present

## 2014-06-28 DIAGNOSIS — I10 Essential (primary) hypertension: Secondary | ICD-10-CM | POA: Insufficient documentation

## 2014-06-28 DIAGNOSIS — Z79899 Other long term (current) drug therapy: Secondary | ICD-10-CM | POA: Diagnosis not present

## 2014-06-28 DIAGNOSIS — R1013 Epigastric pain: Secondary | ICD-10-CM | POA: Insufficient documentation

## 2014-06-28 LAB — I-STAT TROPONIN, ED: Troponin i, poc: 0 ng/mL (ref 0.00–0.08)

## 2014-06-28 LAB — HEPATIC FUNCTION PANEL
ALT: 23 U/L (ref 0–53)
AST: 17 U/L (ref 0–37)
Albumin: 3.6 g/dL (ref 3.5–5.2)
Alkaline Phosphatase: 116 U/L (ref 39–117)
Total Bilirubin: 0.6 mg/dL (ref 0.3–1.2)
Total Protein: 8.1 g/dL (ref 6.0–8.3)

## 2014-06-28 LAB — LIPASE, BLOOD: Lipase: 14 U/L (ref 11–59)

## 2014-06-28 LAB — CBC
HCT: 46 % (ref 39.0–52.0)
Hemoglobin: 15.5 g/dL (ref 13.0–17.0)
MCH: 26.9 pg (ref 26.0–34.0)
MCHC: 33.7 g/dL (ref 30.0–36.0)
MCV: 79.7 fL (ref 78.0–100.0)
PLATELETS: 332 10*3/uL (ref 150–400)
RBC: 5.77 MIL/uL (ref 4.22–5.81)
RDW: 13.5 % (ref 11.5–15.5)
WBC: 9 10*3/uL (ref 4.0–10.5)

## 2014-06-28 LAB — BASIC METABOLIC PANEL
ANION GAP: 15 (ref 5–15)
BUN: 7 mg/dL (ref 6–23)
CO2: 23 meq/L (ref 19–32)
Calcium: 8.9 mg/dL (ref 8.4–10.5)
Chloride: 98 mEq/L (ref 96–112)
Creatinine, Ser: 0.54 mg/dL (ref 0.50–1.35)
GFR calc non Af Amer: >90 mL/min (ref >90)
Glucose, Bld: 375 mg/dL — ABNORMAL HIGH (ref 70–99)
POTASSIUM: 4.1 meq/L (ref 3.7–5.3)
Sodium: 136 mEq/L — ABNORMAL LOW (ref 137–147)

## 2014-06-28 LAB — CBG MONITORING, ED: Glucose-Capillary: 339 mg/dL — ABNORMAL HIGH (ref 70–99)

## 2014-06-28 MED ORDER — ONDANSETRON 4 MG PO TBDP: ORAL_TABLET | ORAL | Status: DC

## 2014-06-28 NOTE — ED Notes (Signed)
Pt c/o dizziness and nausea today with episode of diaphoresis; pt sts some pain in epigastric area; sx started this morning

## 2014-06-28 NOTE — ED Provider Notes (Signed)
CSN: [id number]     Arrival date & time 06/28/14  1233 History   First MD Initiated Contact with Patient 06/28/14 1452     Chief Complaint  Patient presents with  . Dizziness  . Nausea     (Consider location/radiation/quality/duration/timing/severity/associated sxs/prior Treatment) HPI Not taking am insulin when doesn't eat, normally doesn't have time to eat in mornings. Stopped taking Metformin for about a month. Felt lightheaded today and felt nauseated. Not like prior vertigo. Vomited once. Had eaten Stuffing and green beans about 10 minutes prior to vomiting. Checked Blood sugar, was high then felt worse. Also pain RUQ and epigastric that started a little after first onset of nausea.  Past Medical History  Diagnosis Date  . Asthma   . Diabetes mellitus   . Hypertension   . Anxiety   . Vertigo    History reviewed. No pertinent past surgical history. Family History  Problem Relation Age of Onset  . COPD Father   . Hypertension Father   . Heart failure Father   . Diabetes Father   . Sleep apnea Brother    History  Substance Use Topics  . Smoking status: Former Smoker -- 0.30 packs/day for 10 years    Types: Cigarettes    Quit date: 05/28/2013  . Smokeless tobacco: Never Used  . Alcohol Use: No    Review of Systems  10 Systems reviewed and are negative for acute change except as noted in the HPI.   Allergies  Slo-bid gyrocaps and Theophyllines  Home Medications   Prior to Admission medications   Medication Sig Start Date End Date Taking? Authorizing Provider  albuterol (PROVENTIL HFA;VENTOLIN HFA) 108 (90 BASE) MCG/ACT inhaler Inhale 1-2 puffs into the lungs every 6 (six) hours as needed for wheezing or shortness of breath. 01/02/14  Yes April K Palumbo-Rasch, MD  aspirin EC 81 MG tablet Take 81 mg by mouth daily.   Yes Historical Provider, MD  aspirin-sod bicarb-citric acid (ALKA-SELTZER) 325 MG TBEF tablet Take 325 mg by mouth every 6 (six) hours as needed  (nausea).   Yes Historical Provider, MD  cyclobenzaprine (FLEXERIL) 10 MG tablet Take 10 mg by mouth 3 (three) times daily as needed for muscle spasms.   Yes Historical Provider, MD  enalapril (VASOTEC) 10 MG tablet Take 1 tablet (10 mg total) by mouth daily. 06/10/13  Yes Tresa Garter, MD  glucose blood test strip Use as instructed 09/20/13  Yes Reyne Dumas, MD  glucose monitoring kit (FREESTYLE) monitoring kit 1 each by Does not apply route as needed for other. 09/20/13  Yes Reyne Dumas, MD  ibuprofen (ADVIL,MOTRIN) 800 MG tablet Take 800 mg by mouth every 8 (eight) hours as needed.   Yes Historical Provider, MD  insulin NPH-regular (NOVOLIN 70/30) (70-30) 100 UNIT/ML injection Inject 20 Units into the skin 2 (two) times daily with a meal. 09/20/13  Yes Reyne Dumas, MD  Insulin Syringes, Disposable, U-100 0.5 ML MISC 200 09/20/13  Yes Reyne Dumas, MD  Lancets (FREESTYLE) lancets Use as instructed 09/20/13  Yes Reyne Dumas, MD  metFORMIN (GLUCOPHAGE) 850 MG tablet Take 1 tablet (850 mg total) by mouth 2 (two) times daily with a meal. 06/10/13  Yes Tresa Garter, MD  ondansetron (ZOFRAN ODT) 4 MG disintegrating tablet 34m ODT q4 hours prn nausea/vomit 06/28/14   MCharlesetta Shanks MD   BP 128/74  Pulse 85  Temp(Src) 98.2 F (36.8 C) (Oral)  Resp 17  SpO2 98% Physical Exam  Constitutional: He is  oriented to person, place, and time.  Patient is an obese but well-nourished well-developed male. No acute distress. Mental status is clear.  HENT:  Head: Normocephalic and atraumatic.  Nose: Nose normal.  Mouth/Throat: No oropharyngeal exudate.  Eyes: Conjunctivae and EOM are normal. Pupils are equal, round, and reactive to light.  Neck: Neck supple.  Cardiovascular: Normal rate, regular rhythm, normal heart sounds and intact distal pulses.   Pulmonary/Chest: Effort normal and breath sounds normal. No respiratory distress. He has no wheezes. He has no rales.  Abdominal: Soft. He  exhibits no mass. There is tenderness (patient has right upper quadrant and epigastric tenderness to palpation without guarding.). There is no rebound and no guarding.  Musculoskeletal: Normal range of motion. He exhibits no edema and no tenderness.  Neurological: He is alert and oriented to person, place, and time. No cranial nerve deficit. Coordination normal.  Skin: Skin is warm and dry.  Psychiatric: He has a normal mood and affect.    ED Course  Procedures (including critical care time) Labs Review Labs Reviewed  BASIC METABOLIC PANEL - Abnormal; Notable for the following:    Sodium 136 (*)    Glucose, Bld 375 (*)    All other components within normal limits  CBG MONITORING, ED - Abnormal; Notable for the following:    Glucose-Capillary 339 (*)    All other components within normal limits  CBC  LIPASE, BLOOD  HEPATIC FUNCTION PANEL  I-STAT TROPOININ, ED  CBG MONITORING, ED    Imaging Review No results found.   EKG Interpretation   Date/Time:  Tuesday June 28 2014 12:47:26 EDT Ventricular Rate:  92 PR Interval:  130 QRS Duration: 96 QT Interval:  352 QTC Calculation: 435 R Axis:   81 Text Interpretation:  Normal sinus rhythm Lateral infarct , age  undetermined ST' \T' \ T wave abnormality, consider inferior ischemia  Abnormal ECG ED PHYSICIAN INTERPRETATION AVAILABLE IN [facility]  Confirmed by TEST, Record ([id number]) on 06/30/2014 7:40:09 AM      MDM   Final diagnoses:  Hyperglycemia due to type 2 diabetes mellitus  Nausea       Charlesetta Shanks, MD 07/05/14 [phone]

## 2014-06-28 NOTE — ED Notes (Signed)
Pt states he didn't take his insulin this morning because he didn't eat breakfast.  He also states he takes his insulin without checking his blood sugar.  He states he has recently changed his metformin routine. He reports feeling dizzy and "not like myself" for the last week, worse today.

## 2014-06-28 NOTE — Discharge Instructions (Signed)
Blood Glucose Monitoring °Monitoring your blood glucose (also know as blood sugar) helps you to manage your diabetes. It also helps you and your health care provider monitor your diabetes and determine how well your treatment plan is working. °WHY SHOULD YOU MONITOR YOUR BLOOD GLUCOSE? °· It can help you understand how food, exercise, and medicine affect your blood glucose. °· It allows you to know what your blood glucose is at any given moment. You can quickly tell if you are having low blood glucose (hypoglycemia) or high blood glucose (hyperglycemia). °· It can help you and your health care provider know how to adjust your medicines. °· It can help you understand how to manage an illness or adjust medicine for exercise. °WHEN SHOULD YOU TEST? °Your health care provider will help you decide how often you should check your blood glucose. This may depend on the type of diabetes you have, your diabetes control, or the types of medicines you are taking. Be sure to write down all of your blood glucose readings so that this information can be reviewed with your health care provider. See below for examples of testing times that your health care provider may suggest. °Type 1 Diabetes °· Test 4 times a day if you are in good control, using an insulin pump, or perform multiple daily injections. °· If your diabetes is not well controlled or if you are sick, you may need to monitor more often. °· It is a good idea to also monitor: °¨ Before and after exercise. °¨ Between meals and 2 hours after a meal. °¨ Occasionally between 2:00 a.m. and 3:00 a.m. °Type 2 Diabetes °· It can vary with each person, but generally, if you are on insulin, test 4 times a day. °· If you take medicines by mouth (orally), test 2 times a day. °· If you are on a controlled diet, test once a day. °· If your diabetes is not well controlled or if you are sick, you may need to monitor more often. °HOW TO MONITOR YOUR BLOOD GLUCOSE °Supplies  Needed °· Blood glucose meter. °· Test strips for your meter. Each meter has its own strips. You must use the strips that go with your own meter. °· A pricking needle (lancet). °· A device that holds the lancet (lancing device). °· A journal or log book to write down your results. °Procedure °· Wash your hands with soap and water. Alcohol is not preferred. °· Prick the side of your finger (not the tip) with the lancet. °· Gently milk the finger until a small drop of blood appears. °· Follow the instructions that come with your meter for inserting the test strip, applying blood to the strip, and using your blood glucose meter. °Other Areas to Get Blood for Testing °Some meters allow you to use other areas of your body (other than your finger) to test your blood. These areas are called alternative sites. The most common alternative sites are: °· The forearm. °· The thigh. °· The back area of the lower leg. °· The palm of the hand. °The blood flow in these areas is slower. Therefore, the blood glucose values you get may be delayed, and the numbers are different from what you would get from your fingers. Do not use alternative sites if you think you are having hypoglycemia. Your reading will not be accurate. Always use a finger if you are having hypoglycemia. Also, if you cannot feel your lows (hypoglycemia unawareness), always use your fingers for your   blood glucose checks. ADDITIONAL TIPS FOR GLUCOSE MONITORING  Do not reuse lancets.  Always carry your supplies with you.  All blood glucose meters have a 24-hour "hotline" number to call if you have questions or need help.  Adjust (calibrate) your blood glucose meter with a control solution after finishing a few boxes of strips. BLOOD GLUCOSE RECORD KEEPING It is a good idea to keep a daily record or log of your blood glucose readings. Most glucose meters, if not all, keep your glucose records stored in the meter. Some meters come with the ability to download  your records to your home computer. Keeping a record of your blood glucose readings is especially helpful if you are wanting to look for patterns. Make notes to go along with the blood glucose readings because you might forget what happened at that exact time. Keeping good records helps you and your health care provider to work together to achieve good diabetes management.  Document Released: 09/12/2003 Document Revised: 01/24/2014 Document Reviewed: 02/01/2013 [facility] Patient Information 2015 [place], [place]. This information is not intended to replace advice given to you by your health care provider. Make sure you discuss any questions you have with your health care provider.  Diabetes and Exercise Exercising regularly is important. It is not just about losing weight. It has many health benefits, such as:  Improving your overall fitness, flexibility, and endurance.  Increasing your bone density.  Helping with weight control.  Decreasing your body fat.  Increasing your muscle strength.  Reducing stress and tension.  Improving your overall health. People with diabetes who exercise gain additional benefits because exercise:  Reduces appetite.  Improves the body's use of blood sugar (glucose).  Helps lower or control blood glucose.  Decreases blood pressure.  Helps control blood lipids (such as cholesterol and triglycerides).  Improves the body's use of the hormone insulin by:  Increasing the body's insulin sensitivity.  Reducing the body's insulin needs.  Decreases the risk for heart disease because exercising:  Lowers cholesterol and triglycerides levels.  Increases the levels of good cholesterol (such as high-density lipoproteins [HDL]) in the body.  Lowers blood glucose levels. YOUR ACTIVITY PLAN  Choose an activity that you enjoy and set realistic goals. Your health care provider or diabetes educator can help you make an activity plan that works for you. Exercise  regularly as directed by your health care provider. This includes:  Performing resistance training twice a week such as push-ups, sit-ups, lifting weights, or using resistance bands.  Performing 150 minutes of cardio exercises each week such as walking, running, or playing sports.  Staying active and spending no more than 90 minutes at one time being inactive. Even short bursts of exercise are good for you. Three 10-minute sessions spread throughout the day are just as beneficial as a single 30-minute session. Some exercise ideas include:  Taking the dog for a walk.  Taking the stairs instead of the elevator.  Dancing to your favorite song.  Doing an exercise video.  Doing your favorite exercise with a friend. RECOMMENDATIONS FOR EXERCISING WITH TYPE 1 OR TYPE 2 DIABETES   Check your blood glucose before exercising. If blood glucose levels are greater than 240 mg/dL, check for urine ketones. Do not exercise if ketones are present.  Avoid injecting insulin into areas of the body that are going to be exercised. For example, avoid injecting insulin into:  The arms when playing tennis.  The legs when jogging.  Keep a record of:  Food intake before and after you exercise.  Expected peak times of insulin action.  Blood glucose levels before and after you exercise.  The type and amount of exercise you have done.  Review your records with your health care provider. Your health care provider will help you to develop guidelines for adjusting food intake and insulin amounts before and after exercising.  If you take insulin or oral hypoglycemic agents, watch for signs and symptoms of hypoglycemia. They include:  Dizziness.  Shaking.  Sweating.  Chills.  Confusion.  Drink plenty of water while you exercise to prevent dehydration or heat stroke. Body water is lost during exercise and must be replaced.  Talk to your health care provider before starting an exercise program to  make sure it is safe for you. Remember, almost any type of activity is better than none. Document Released: 11/30/2003 Document Revised: 01/24/2014 Document Reviewed: 02/16/2013 [facility] Patient Information 2015 [place]. This information is not intended to replace advice given to you by your health care provider. Make sure you discuss any questions you have with your health care provider.  RESTART YOUR MEDICATION AS PRESCRIBED. SEE YOUR DOCTOR THIS WEEK.

## 2014-07-22 ENCOUNTER — Encounter (HOSPITAL_BASED_OUTPATIENT_CLINIC_OR_DEPARTMENT_OTHER): Payer: Self-pay | Admitting: Emergency Medicine

## 2014-07-22 ENCOUNTER — Observation Stay (HOSPITAL_COMMUNITY): Payer: Managed Care, Other (non HMO)

## 2014-07-22 ENCOUNTER — Observation Stay (HOSPITAL_BASED_OUTPATIENT_CLINIC_OR_DEPARTMENT_OTHER)
Admission: EM | Admit: 2014-07-22 | Discharge: 2014-07-23 | Disposition: A | Discharge status: Home / Self Care | Payer: Managed Care, Other (non HMO) | Attending: Family Medicine | Admitting: Family Medicine

## 2014-07-22 ENCOUNTER — Emergency Department (HOSPITAL_BASED_OUTPATIENT_CLINIC_OR_DEPARTMENT_OTHER): Payer: Managed Care, Other (non HMO)

## 2014-07-22 DIAGNOSIS — Z794 Long term (current) use of insulin: Secondary | ICD-10-CM | POA: Diagnosis not present

## 2014-07-22 DIAGNOSIS — G459 Transient cerebral ischemic attack, unspecified: Secondary | ICD-10-CM | POA: Diagnosis not present

## 2014-07-22 DIAGNOSIS — Z7982 Long term (current) use of aspirin: Secondary | ICD-10-CM | POA: Diagnosis not present

## 2014-07-22 DIAGNOSIS — G4733 Obstructive sleep apnea (adult) (pediatric): Secondary | ICD-10-CM | POA: Diagnosis present

## 2014-07-22 DIAGNOSIS — E1110 Type 2 diabetes mellitus with ketoacidosis without coma: Secondary | ICD-10-CM | POA: Diagnosis present

## 2014-07-22 DIAGNOSIS — Z6841 Body Mass Index (BMI) 40.0 and over, adult: Secondary | ICD-10-CM | POA: Diagnosis not present

## 2014-07-22 DIAGNOSIS — IMO0002 Reserved for concepts with insufficient information to code with codable children: Secondary | ICD-10-CM

## 2014-07-22 DIAGNOSIS — R202 Paresthesia of skin: Secondary | ICD-10-CM

## 2014-07-22 DIAGNOSIS — E119 Type 2 diabetes mellitus without complications: Secondary | ICD-10-CM

## 2014-07-22 DIAGNOSIS — G4730 Sleep apnea, unspecified: Secondary | ICD-10-CM | POA: Diagnosis not present

## 2014-07-22 DIAGNOSIS — I152 Hypertension secondary to endocrine disorders: Secondary | ICD-10-CM | POA: Diagnosis present

## 2014-07-22 DIAGNOSIS — J45909 Unspecified asthma, uncomplicated: Secondary | ICD-10-CM | POA: Diagnosis not present

## 2014-07-22 DIAGNOSIS — I10 Essential (primary) hypertension: Secondary | ICD-10-CM | POA: Diagnosis not present

## 2014-07-22 DIAGNOSIS — R739 Hyperglycemia, unspecified: Secondary | ICD-10-CM

## 2014-07-22 DIAGNOSIS — Z79899 Other long term (current) drug therapy: Secondary | ICD-10-CM | POA: Diagnosis not present

## 2014-07-22 DIAGNOSIS — E1165 Type 2 diabetes mellitus with hyperglycemia: Secondary | ICD-10-CM | POA: Diagnosis not present

## 2014-07-22 DIAGNOSIS — Z9989 Dependence on other enabling machines and devices: Secondary | ICD-10-CM

## 2014-07-22 DIAGNOSIS — R200 Anesthesia of skin: Secondary | ICD-10-CM | POA: Diagnosis present

## 2014-07-22 DIAGNOSIS — Z87891 Personal history of nicotine dependence: Secondary | ICD-10-CM | POA: Insufficient documentation

## 2014-07-22 DIAGNOSIS — E1159 Type 2 diabetes mellitus with other circulatory complications: Secondary | ICD-10-CM | POA: Diagnosis present

## 2014-07-22 LAB — ETHANOL

## 2014-07-22 LAB — URINALYSIS, ROUTINE W REFLEX MICROSCOPIC
Bilirubin Urine: NEGATIVE
Hgb urine dipstick: NEGATIVE
Ketones, ur: NEGATIVE mg/dL
LEUKOCYTES UA: NEGATIVE
Nitrite: NEGATIVE
PROTEIN: NEGATIVE mg/dL
Specific Gravity, Urine: 1.022 (ref 1.005–1.030)
UROBILINOGEN UA: 0.2 mg/dL (ref 0.0–1.0)
pH: 6 (ref 5.0–8.0)

## 2014-07-22 LAB — COMPREHENSIVE METABOLIC PANEL
ALT: 16 U/L (ref 0–53)
ANION GAP: 15 (ref 5–15)
AST: 14 U/L (ref 0–37)
Albumin: 3.6 g/dL (ref 3.5–5.2)
Alkaline Phosphatase: 119 U/L — ABNORMAL HIGH (ref 39–117)
BILIRUBIN TOTAL: 0.6 mg/dL (ref 0.3–1.2)
BUN: 10 mg/dL (ref 6–23)
CHLORIDE: 99 meq/L (ref 96–112)
CO2: 22 meq/L (ref 19–32)
CREATININE: 0.5 mg/dL (ref 0.50–1.35)
Calcium: 9.2 mg/dL (ref 8.4–10.5)
GFR calc Af Amer: >90 mL/min (ref >90)
GFR calc non Af Amer: >90 mL/min (ref >90)
Glucose, Bld: 266 mg/dL — ABNORMAL HIGH (ref 70–99)
Potassium: 4.3 mEq/L (ref 3.7–5.3)
Sodium: 136 mEq/L — ABNORMAL LOW (ref 137–147)
Total Protein: 7.8 g/dL (ref 6.0–8.3)

## 2014-07-22 LAB — DIFFERENTIAL
Band Neutrophils: 0 % (ref 0–10)
Basophils Absolute: 0 10*3/uL (ref 0.0–0.1)
Basophils Relative: 0 % (ref 0–1)
Blasts: 0 %
Eosinophils Absolute: 0.5 10*3/uL (ref 0.0–0.7)
Eosinophils Relative: 6 % — ABNORMAL HIGH (ref 0–5)
LYMPHS ABS: 2.8 10*3/uL (ref 0.7–4.0)
LYMPHS PCT: 32 % (ref 12–46)
MYELOCYTES: 0 %
Metamyelocytes Relative: 1 %
Monocytes Absolute: 0.8 10*3/uL (ref 0.1–1.0)
Monocytes Relative: 9 % (ref 3–12)
NEUTROS ABS: 4.7 10*3/uL (ref 1.7–7.7)
NEUTROS PCT: 52 % (ref 43–77)
NRBC: 0 /100{WBCs}
PROMYELOCYTES ABS: 0 %

## 2014-07-22 LAB — CREATININE, SERUM
CREATININE: 0.73 mg/dL (ref 0.50–1.35)
GFR calc Af Amer: >90 mL/min (ref >90)

## 2014-07-22 LAB — CBC
HCT: 44 % (ref 39.0–52.0)
HCT: 43.5 % (ref 39.0–52.0)
HEMOGLOBIN: 14.4 g/dL (ref 13.0–17.0)
Hemoglobin: 14.4 g/dL (ref 13.0–17.0)
MCH: 26.8 pg (ref 26.0–34.0)
MCH: 26.9 pg (ref 26.0–34.0)
MCHC: 32.7 g/dL (ref 30.0–36.0)
MCHC: 33.1 g/dL (ref 30.0–36.0)
MCV: 81.9 fL (ref 78.0–100.0)
MCV: 81.2 fL (ref 78.0–100.0)
PLATELETS: 367 10*3/uL (ref 150–400)
Platelets: 365 10*3/uL (ref 150–400)
RBC: 5.37 MIL/uL (ref 4.22–5.81)
RBC: 5.36 MIL/uL (ref 4.22–5.81)
RDW: 13.8 % (ref 11.5–15.5)
RDW: 13.5 % (ref 11.5–15.5)
WBC: 8.8 10*3/uL (ref 4.0–10.5)
WBC: 10.3 10*3/uL (ref 4.0–10.5)

## 2014-07-22 LAB — URINE MICROSCOPIC-ADD ON

## 2014-07-22 LAB — APTT: APTT: 30 s (ref 24–37)

## 2014-07-22 LAB — RAPID URINE DRUG SCREEN, HOSP PERFORMED
Amphetamines: NOT DETECTED
Barbiturates: NOT DETECTED
Benzodiazepines: NOT DETECTED
Cocaine: NOT DETECTED
OPIATES: NOT DETECTED
TETRAHYDROCANNABINOL: NOT DETECTED

## 2014-07-22 LAB — PROTIME-INR
INR: 0.94 (ref 0.00–1.49)
PROTHROMBIN TIME: 12.6 s (ref 11.6–15.2)

## 2014-07-22 LAB — TROPONIN I

## 2014-07-22 LAB — GLUCOSE, CAPILLARY
GLUCOSE-CAPILLARY: 165 mg/dL — AB (ref 70–99)
Glucose-Capillary: 245 mg/dL — ABNORMAL HIGH (ref 70–99)

## 2014-07-22 MED ORDER — ASPIRIN EC 325 MG PO TBEC
325.0000 mg | DELAYED_RELEASE_TABLET | Freq: Every day | ORAL | Status: DC
  Administered 2014-07-23: 325 mg via ORAL
  Filled 2014-07-22: qty 1

## 2014-07-22 MED ORDER — INSULIN ASPART PROT & ASPART (70-30 MIX) 100 UNIT/ML ~~LOC~~ SUSP
30.0000 [IU] | Freq: Two times a day (BID) | SUBCUTANEOUS | Status: DC
  Administered 2014-07-23: 30 [IU] via SUBCUTANEOUS
  Filled 2014-07-22: qty 10

## 2014-07-22 MED ORDER — SODIUM CHLORIDE 0.9 % IJ SOLN: 3.0000 mL | INTRAMUSCULAR | Status: DC | PRN

## 2014-07-22 MED ORDER — SODIUM CHLORIDE 0.9 % IJ SOLN: 3.0000 mL | Freq: Two times a day (BID) | INTRAMUSCULAR | Status: DC

## 2014-07-22 MED ORDER — SODIUM CHLORIDE 0.9 % IJ SOLN
3.0000 mL | Freq: Two times a day (BID) | INTRAMUSCULAR | Status: DC
  Administered 2014-07-22 – 2014-07-23 (×2): 3 mL via INTRAVENOUS

## 2014-07-22 MED ORDER — ONDANSETRON HCL 4 MG PO TABS: 4.0000 mg | ORAL_TABLET | Freq: Four times a day (QID) | ORAL | Status: DC | PRN

## 2014-07-22 MED ORDER — INSULIN ASPART 100 UNIT/ML ~~LOC~~ SOLN
0.0000 [IU] | Freq: Three times a day (TID) | SUBCUTANEOUS | Status: DC
  Administered 2014-07-22: 3 [IU] via SUBCUTANEOUS
  Administered 2014-07-23: 8 [IU] via SUBCUTANEOUS

## 2014-07-22 MED ORDER — HEPARIN SODIUM (PORCINE) 5000 UNIT/ML IJ SOLN
5000.0000 [IU] | Freq: Three times a day (TID) | INTRAMUSCULAR | Status: DC
  Administered 2014-07-22 – 2014-07-23 (×2): 5000 [IU] via SUBCUTANEOUS
  Filled 2014-07-22 (×2): qty 1

## 2014-07-22 MED ORDER — INSULIN ASPART 100 UNIT/ML ~~LOC~~ SOLN
0.0000 [IU] | Freq: Every day | SUBCUTANEOUS | Status: DC
  Administered 2014-07-22: 2 [IU] via SUBCUTANEOUS

## 2014-07-22 MED ORDER — ALBUTEROL SULFATE (2.5 MG/3ML) 0.083% IN NEBU: 3.0000 mL | INHALATION_SOLUTION | RESPIRATORY_TRACT | Status: DC | PRN

## 2014-07-22 MED ORDER — SODIUM CHLORIDE 0.9 % IV SOLN
250.0000 mL | INTRAVENOUS | Status: DC | PRN
Start: 2014-07-22 — End: 2014-07-23

## 2014-07-22 MED ORDER — ONDANSETRON HCL 4 MG/2ML IJ SOLN: 4.0000 mg | Freq: Four times a day (QID) | INTRAMUSCULAR | Status: DC | PRN

## 2014-07-22 MED ORDER — ACETAMINOPHEN 325 MG PO TABS
650.0000 mg | ORAL_TABLET | Freq: Once | ORAL | Status: AC
  Administered 2014-07-22: 650 mg via ORAL
  Filled 2014-07-22: qty 2

## 2014-07-22 NOTE — Progress Notes (Signed)
Pt arrived in 4N22. Pt in no acute distress. MD notified of pt's arrival.

## 2014-07-22 NOTE — ED Notes (Signed)
Family at bedside. 

## 2014-07-22 NOTE — Progress Notes (Signed)
Inpatient Diabetes Program Recommendations  AACE/ADA: New Consensus Statement on Inpatient Glycemic Control (2013)  Target Ranges:  Prepandial:   less than 140 mg/dL      Peak postprandial:   less than 180 mg/dL (1-2 hours)      Critically ill patients:  140 - 180 mg/dL   Inpatient Diabetes Program Recommendations Correction (SSI): Start Novolog mod scale Q 4 while NPO Thank you  Piedad ClimesGina Heavenlee Maiorana BSN, RN,CDE Inpatient Diabetes Coordinator [phone] (team pager)

## 2014-07-22 NOTE — ED Notes (Signed)
Pt states that he had slurred speech 2 days ago but has cleared up.  PT states that he had tingling in his right hand but hasn't cleared up.

## 2014-07-22 NOTE — Progress Notes (Signed)
Pt oriented to room and equipment. Telemetry monitoring started. MD will come to pt's room. Pt alert and oriented x4. Will continue to monitor.

## 2014-07-22 NOTE — ED Notes (Signed)
Waiting for RT to draw labs before doing CT.  Thanks

## 2014-07-22 NOTE — H&P (Signed)
[facility] History and Physical  Michael Foster BWI:[id number] DOB: 03/20/1976 DOA: 07/22/2014  Referring physician: Dr. Leonides Grills PCP: Philis Fendt, MD   Chief Complaint: Left arm numbness 2 days ago, nausea, and emesis  HPI: Michael Foster is a 38 y.o. male  Who presented to the hospital after he had developed numbness in his arm 2 days ago. He also endorses nausea and emesis. The nausea and emesis has persisted until day of presentation per my discussion with patient. Nothing he is aware of is making it better or worse. The numbness and slurred speech resolved 2 days ago without intervention. Given persistence and nausea and emesis patient decided to present to the ED for further evaluation recommendations.  CT scan in the ED was negative for acute intracranial abnormalities. We were consulted for further medical evaluation and recommendations.   Review of Systems:  Constitutional:  No weight loss, night sweats, Fevers, chills, fatigue.  HEENT:  No headaches, Difficulty swallowing,Tooth/dental problems,Sore throat,  No sneezing, itching, ear ache, nasal congestion, post nasal drip,  Cardio-vascular:  No chest pain, Orthopnea, PND, swelling in lower extremities, anasarca, dizziness, palpitations  GI:  No heartburn, indigestion, abdominal pain, + nausea, + vomiting, diarrhea, change in bowel habits, loss of appetite. Resp:  No shortness of breath with exertion or at rest. No excess mucus, no productive cough, No non-productive cough, No coughing up of blood.No change in color of mucus.No wheezing.No chest wall deformity  Skin:  no rash or lesions.  GU:  no dysuria, change in color of urine, no urgency or frequency. No flank pain.  Musculoskeletal:  No joint pain or swelling. No decreased range of motion. No back pain.  Psych:  No change in mood or affect. No depression or anxiety. No memory loss.   Past Medical History  Diagnosis Date  . Asthma   . Diabetes  mellitus   . Hypertension   . Anxiety   . Vertigo    Past Surgical History  Procedure Laterality Date  . Mouth surgery     Social History:  reports that he quit smoking about 13 months ago. His smoking use included Cigarettes. He has a 3 pack-year smoking history. He has never used smokeless tobacco. He reports that he does not drink alcohol or use illicit drugs.  Allergies  Allergen Reactions  . Slo-Bid Gyrocaps [Theophylline] Other (See Comments)    Unknown childhood reaction  . Theophyllines Other (See Comments)    Unknown childhood reaction    Family History  Problem Relation Age of Onset  . COPD Father   . Hypertension Father   . Heart failure Father   . Diabetes Father   . Sleep apnea Brother      Prior to Admission medications   Medication Sig Start Date End Date Taking? Authorizing Provider  albuterol (PROAIR HFA) 108 (90 BASE) MCG/ACT inhaler Inhale 1-2 puffs into the lungs every 3 (three) hours as needed for wheezing or shortness of breath.   Yes Historical Provider, MD  aspirin EC 81 MG tablet Take 81 mg by mouth daily.   Yes Historical Provider, MD  aspirin-sod bicarb-citric acid (ALKA-SELTZER) 325 MG TBEF tablet Take 650 mg by mouth 2 (two) times daily as needed (nausea).    Yes Historical Provider, MD  ibuprofen (ADVIL,MOTRIN) 800 MG tablet Take 800 mg by mouth every 8 (eight) hours as needed for headache (pain).    Yes Historical Provider, MD  insulin NPH-regular Human (HUMULIN 70/30) (70-30) 100 UNIT/ML injection Inject 20-30  Units into the skin 2 (two) times daily with a meal. Inject 30 units with breakfast and 20 units with dinner   Yes Historical Provider, MD  lisinopril (PRINIVIL,ZESTRIL) 10 MG tablet Take 10 mg by mouth daily.  06/05/14  Yes Historical Provider, MD  metFORMIN (GLUCOPHAGE) 500 MG tablet Take 500 mg by mouth 2 (two) times daily with a meal.   Yes Historical Provider, MD  glucose blood test strip Use as instructed 09/20/13   Reyne Dumas, MD    glucose monitoring kit (FREESTYLE) monitoring kit 1 each by Does not apply route as needed for other. 09/20/13   Reyne Dumas, MD  Insulin Syringes, Disposable, U-100 0.5 ML MISC 200 09/20/13   Reyne Dumas, MD  Lancets (FREESTYLE) lancets Use as instructed 09/20/13   Reyne Dumas, MD   Physical Exam: Filed Vitals:   07/22/14 0817 07/22/14 1002 07/22/14 1059 07/22/14 1344  BP: 160/85 137/71 129/73 156/84  Pulse: 85 87 77 77  Temp: 98.7 F (37.1 C)   98.2 F (36.8 C)  TempSrc: Oral   Oral  Resp: '20 18 18 18  ' Height: '5\' 9"'  (1.753 m)   '5\' 9"'  (1.753 m)  Weight: 148.326 kg (327 lb)   147.3 kg (324 lb 11.8 oz)  SpO2: 97% 99% 99% 99%    Wt Readings from Last 3 Encounters:  07/22/14 147.3 kg (324 lb 11.8 oz)  01/02/14 146.058 kg (322 lb)  11/25/13 154.767 kg (341 lb 3.2 oz)    General:  Appears calm and comfortable Eyes: PERRL, normal lids, irises & conjunctiva ENT: grossly normal hearing, lips & tongue Neck: no LAD, masses or thyromegaly Cardiovascular: RRR, no m/r/g. No LE edema. Respiratory: CTA bilaterally, no w/r/r. Normal respiratory effort. Abdomen: soft, nt, nd Skin: no rash or induration seen on limited exam Musculoskeletal: grossly normal tone BUE/BLE Psychiatric: grossly normal mood and affect, speech fluent and appropriate Neurologic: grossly non-focal.          Labs on Admission:  Basic Metabolic Panel:  Recent Labs Lab 07/22/14 0935  NA 136*  K 4.3  CL 99  CO2 22  GLUCOSE 266*  BUN 10  CREATININE 0.50  CALCIUM 9.2   Liver Function Tests:  Recent Labs Lab 07/22/14 0935  AST 14  ALT 16  ALKPHOS 119*  BILITOT 0.6  PROT 7.8  ALBUMIN 3.6   No results found for this basename: LIPASE, AMYLASE,  in the last 168 hours No results found for this basename: AMMONIA,  in the last 168 hours CBC:  Recent Labs Lab 07/22/14 0935  WBC 8.8  NEUTROABS 4.7  HGB 14.4  HCT 44.0  MCV 81.9  PLT 367   Cardiac Enzymes:  Recent Labs Lab 07/22/14 0935   TROPONINI <0.30    BNP (last 3 results) No results found for this basename: PROBNP,  in the last 8760 hours CBG: No results found for this basename: GLUCAP,  in the last 168 hours  Radiological Exams on Admission: Ct Head Wo Contrast  07/22/2014   CLINICAL DATA:  Slurred speech for 2 days.  Tingling in right hand.  EXAM: CT HEAD WITHOUT CONTRAST  TECHNIQUE: Contiguous axial images were obtained from the base of the skull through the vertex without intravenous contrast.  COMPARISON:  09/09/2011  FINDINGS: No acute intracranial abnormality. Specifically, no hemorrhage, hydrocephalus, mass lesion, acute infarction, or significant intracranial injury. No acute calvarial abnormality. Visualized paranasal sinuses and mastoids clear. Orbital soft tissues unremarkable.  IMPRESSION: No acute intracranial abnormality.  Electronically Signed   By: Rolm Baptise M.D.   On: 07/22/2014 10:19    EKG: Independently reviewed. Sinus rhythm with no st elevations or depressions  Assessment/Plan  Principle problem:   Numbness and tingling in left hand - I am not entirely convinced patient had a TIA or stroke. - Nonetheless he does have risk factors including diabetes, hypertension, hyperlipidemia, and morbid obesity. - At this juncture I favor obtaining MRI and while results are pending we will place on aspirin 325 mg. - Should MRI come back negative then other etiologies can be worked up as an outpatient by his primary care physician.  Active Problems:   Essential hypertension - We'll hold hypertensive regimen while MRI results are pending    Asthma -Stable continue albuterol    Sleep apnea -Patient to continue home CPAP    Diabetes -Diabetic diet - Hold metformin while in house - Continue NovoLog 70/30 at 30 units subcutaneous twice a day - SSI moderate scale    Code Status: full DVT Prophylaxis:heparin SQ Family Communication: discussed with patient and spouse Disposition Plan: Pending  MRI results  Time spent: > 55 minutes  Velvet Bathe [facility] Pager [phone]

## 2014-07-22 NOTE — ED Notes (Signed)
Pt in CT.

## 2014-07-22 NOTE — ED Provider Notes (Signed)
CSN: [id number]     Arrival date & time 07/22/14  0807 History   First MD Initiated Contact with Patient 07/22/14 [phone]     Chief Complaint  Patient presents with  . Tingling    in right hand     The history is provided by the patient.  Patient presents for episode of slurred speech.  He reports it occurred two days ago, and it lasted for at least two hours.  It is now resolved.  He denies associated Ha/visual changes/weakness.  No cp/sob.  He reports numbness to his right fingers and numbness to his left foot.   No neck or back pain He reports his slurred speech occurred after vomiting.    He denies h/o CVA     Past Medical History  Diagnosis Date  . Asthma   . Diabetes mellitus   . Hypertension   . Anxiety   . Vertigo    Past Surgical History  Procedure Laterality Date  . Mouth surgery     Family History  Problem Relation Age of Onset  . COPD Father   . Hypertension Father   . Heart failure Father   . Diabetes Father   . Sleep apnea Brother    History  Substance Use Topics  . Smoking status: Former Smoker -- 0.30 packs/day for 10 years    Types: Cigarettes    Quit date: 05/28/2013  . Smokeless tobacco: Never Used  . Alcohol Use: No    Review of Systems  Constitutional: Negative for fever.  Respiratory: Negative for shortness of breath.   Cardiovascular: Negative for chest pain.  Gastrointestinal: Positive for vomiting.  Neurological: Positive for speech difficulty.  All other systems reviewed and are negative.     Allergies  Slo-bid gyrocaps and Theophyllines  Home Medications   Prior to Admission medications   Medication Sig Start Date End Date Taking? Authorizing Provider  albuterol (PROVENTIL HFA;VENTOLIN HFA) 108 (90 BASE) MCG/ACT inhaler Inhale 1-2 puffs into the lungs every 6 (six) hours as needed for wheezing or shortness of breath. 01/02/14   April K Palumbo-Rasch, MD  aspirin EC 81 MG tablet Take 81 mg by mouth daily.    Historical  Provider, MD  aspirin-sod bicarb-citric acid (ALKA-SELTZER) 325 MG TBEF tablet Take 325 mg by mouth every 6 (six) hours as needed (nausea).    Historical Provider, MD  cyclobenzaprine (FLEXERIL) 10 MG tablet Take 10 mg by mouth 3 (three) times daily as needed for muscle spasms.    Historical Provider, MD  enalapril (VASOTEC) 10 MG tablet Take 1 tablet (10 mg total) by mouth daily. 06/10/13   Tresa Garter, MD  glucose blood test strip Use as instructed 09/20/13   Reyne Dumas, MD  glucose monitoring kit (FREESTYLE) monitoring kit 1 each by Does not apply route as needed for other. 09/20/13   Reyne Dumas, MD  ibuprofen (ADVIL,MOTRIN) 800 MG tablet Take 800 mg by mouth every 8 (eight) hours as needed.    Historical Provider, MD  insulin NPH-regular (NOVOLIN 70/30) (70-30) 100 UNIT/ML injection Inject 20 Units into the skin 2 (two) times daily with a meal. 09/20/13   Reyne Dumas, MD  Insulin Syringes, Disposable, U-100 0.5 ML MISC 200 09/20/13   Reyne Dumas, MD  Lancets (FREESTYLE) lancets Use as instructed 09/20/13   Reyne Dumas, MD  metFORMIN (GLUCOPHAGE) 850 MG tablet Take 1 tablet (850 mg total) by mouth 2 (two) times daily with a meal. 06/10/13   Tresa Garter, MD  ondansetron (ZOFRAN ODT) 4 MG disintegrating tablet 69m ODT q4 hours prn nausea/vomit 06/28/14   MCharlesetta Shanks MD   BP 160/85  Pulse 85  Temp(Src) 98.7 F (37.1 C) (Oral)  Resp 20  Ht '5\' 9"'  (1.753 m)  Wt 327 lb (148.326 kg)  BMI 48.27 kg/m2  SpO2 97% Physical Exam CONSTITUTIONAL: Well developed/well nourished HEAD: Normocephalic/atraumatic EYES: EOMI/PERRL, no nystagmus, no ptosis ENMT: Mucous membranes moist NECK: supple no meningeal signs, no bruits CV: S1/S2 noted, no murmurs/rubs/gallops noted LUNGS: Lungs are clear to auscultation bilaterally, no apparent distress ABDOMEN: soft, nontender, no rebound or guarding GU:no cva tenderness NEURO:Awake/alert, facies symmetric, no arm or leg drift is  noted Equal 5/5 strength with shoulder abduction, elbow flex/extension, wrist flex/extension in upper extremities and equal hand grips bilaterally Equal 5/5 strength with hip flexion,knee flex/extension, foot dorsi/plantar flexion Cranial nerves 3/4/5/6/03/31/09/11/12 tested and intact Gait normal without ataxia No past pointing No focal sensory deficit NIHSS=0 EXTREMITIES: pulses normal, full ROM SKIN: warm, color normal PSYCH: no abnormalities of mood noted  ED Course  Procedures  9:37 AM Possible TIA Pt with multiple risk factors ABCD2 score 5 Advised admission 11:03 AM Initial workup negative Will admit for TIA D/w dr fVenetia ConstableAccepted to [facility] Pt requests to drive via private vehicle (wife will take)  Labs Review Labs Reviewed  DIFFERENTIAL - Abnormal; Notable for the following:    Eosinophils Relative 6 (*)    All other components within normal limits  COMPREHENSIVE METABOLIC PANEL - Abnormal; Notable for the following:    Sodium 136 (*)    Glucose, Bld 266 (*)    Alkaline Phosphatase 119 (*)    All other components within normal limits  URINALYSIS, ROUTINE W REFLEX MICROSCOPIC - Abnormal; Notable for the following:    Glucose, UA >1000 (*)    All other components within normal limits  ETHANOL  PROTIME-INR  APTT  CBC  URINE RAPID DRUG SCREEN (HOSP PERFORMED)  TROPONIN I  URINE MICROSCOPIC-ADD ON    Imaging Review Ct Head Wo Contrast  07/22/2014   CLINICAL DATA:  Slurred speech for 2 days.  Tingling in right hand.  EXAM: CT HEAD WITHOUT CONTRAST  TECHNIQUE: Contiguous axial images were obtained from the base of the skull through the vertex without intravenous contrast.  COMPARISON:  09/09/2011  FINDINGS: No acute intracranial abnormality. Specifically, no hemorrhage, hydrocephalus, mass lesion, acute infarction, or significant intracranial injury. No acute calvarial abnormality. Visualized paranasal sinuses and mastoids clear. Orbital soft tissues  unremarkable.  IMPRESSION: No acute intracranial abnormality.   Electronically Signed   By: KRolm BaptiseM.D.   On: 07/22/2014 10:19     EKG Interpretation   Date/Time:  Friday July 22 2014 09:39:29 EDT Ventricular Rate:  83 PR Interval:  176 QRS Duration: 88 QT Interval:  352 QTC Calculation: 413 R Axis:   92 Text Interpretation:  Normal sinus rhythm Rightward axis Borderline ECG  Confirmed by WChristy Gentles MD, DElenore Rota([id number] on 07/22/2014 9:46:23 AM      MDM   Final diagnoses:  Transient cerebral ischemia, unspecified transient cerebral ischemia type  Hyperglycemia    Nursing notes including past medical history and social history reviewed and considered in documentation Previous records reviewed and considered     DSharyon Cable MD 07/22/14 1104

## 2014-07-22 NOTE — Progress Notes (Signed)
38 year old male with past medical history of diabetes type 1 with a last hemoglobin A1c of 12.5, hypertension that went to [facility] for slurred speech and left arm numbness is noted 2 days prior to admission. He relates no visual changes nausea vomiting chest pain. Admit to a telemetry bed for an MRI. Labs look with a normal gap blood glucose at 200. Question of hyperglycemia can shooting to his symptoms.

## 2014-07-23 DIAGNOSIS — E1165 Type 2 diabetes mellitus with hyperglycemia: Secondary | ICD-10-CM

## 2014-07-23 DIAGNOSIS — I10 Essential (primary) hypertension: Secondary | ICD-10-CM

## 2014-07-23 DIAGNOSIS — R202 Paresthesia of skin: Secondary | ICD-10-CM

## 2014-07-23 DIAGNOSIS — R739 Hyperglycemia, unspecified: Secondary | ICD-10-CM

## 2014-07-23 LAB — CBC
HCT: 44.9 % (ref 39.0–52.0)
Hemoglobin: 14.8 g/dL (ref 13.0–17.0)
MCH: 27.5 pg (ref 26.0–34.0)
MCHC: 33 g/dL (ref 30.0–36.0)
MCV: 83.3 fL (ref 78.0–100.0)
Platelets: 360 10*3/uL (ref 150–400)
RBC: 5.39 MIL/uL (ref 4.22–5.81)
RDW: 13.7 % (ref 11.5–15.5)
WBC: 9 10*3/uL (ref 4.0–10.5)

## 2014-07-23 LAB — BASIC METABOLIC PANEL WITH GFR
Anion gap: 13 (ref 5–15)
BUN: 10 mg/dL (ref 6–23)
CO2: 24 meq/L (ref 19–32)
Calcium: 9.2 mg/dL (ref 8.4–10.5)
Chloride: 98 meq/L (ref 96–112)
Creatinine, Ser: 0.51 mg/dL (ref 0.50–1.35)
GFR calc Af Amer: >90 mL/min
GFR calc non Af Amer: >90 mL/min
Glucose, Bld: 239 mg/dL — ABNORMAL HIGH (ref 70–99)
Potassium: 4.4 meq/L (ref 3.7–5.3)
Sodium: 135 meq/L — ABNORMAL LOW (ref 137–147)

## 2014-07-23 LAB — GLUCOSE, CAPILLARY: Glucose-Capillary: 256 mg/dL — ABNORMAL HIGH (ref 70–99)

## 2014-07-23 MED ORDER — INSULIN NPH ISOPHANE & REGULAR (70-30) 100 UNIT/ML ~~LOC~~ SUSP: 30.0000 [IU] | Freq: Two times a day (BID) | SUBCUTANEOUS | Status: DC

## 2014-07-23 NOTE — Discharge Summary (Signed)
Physician Discharge Summary  Michael Foster:[id number] DOB: [birth year] DOA: 07/22/2014  PCP: Philis Fendt, MD  Admit date: 07/22/2014 Discharge date: 07/23/2014  Time spent: 35 minutes  Recommendations for Outpatient Follow-up:  1. Please follow-up on patient's blood sugars, has history of poorly controlled diabetes mellitus having elevated blood sugars during this hospitalization. His insulin 70/30 was increased to 30 units obtaining his twice a day and maintained on metformin 500 mg by mouth twice a day. 2. He reported episodes of nausea/vomiting over the past several months, we discussed the possibility of this reflecting diabetic gastroparesis. He expressed wishes to discuss this further with his primary care physician.   Discharge Diagnoses:  Active Problems:   Essential hypertension   Asthma   Sleep apnea   Diabetes   Essential hypertension, benign   Morbid obesity   TIA (transient ischemic attack)   Numbness and tingling in left hand   Discharge Condition: Stable/improved  Diet recommendation: Carb modified  Filed Weights   07/22/14 0817 07/22/14 1344  Weight: 148.326 kg (327 lb) 147.3 kg (324 lb 11.8 oz)    History of present illness:  Michael Foster is a 38 y.o. male  Who presented to the hospital after he had developed numbness in his arm 2 days ago. He also endorses nausea and emesis. The nausea and emesis has persisted until day of presentation per my discussion with patient. Nothing he is aware of is making it better or worse. The numbness and slurred speech resolved 2 days ago without intervention. Given persistence and nausea and emesis patient decided to present to the ED for further evaluation recommendations.  Hospital Course:  Patient is a pleasant 38 year old gentleman with a past medical history of morbid obesity, insulin-dependent diabetes mellitus poorly controlled, hypertension who was admitted to the medicine service on 06/25/2014. He presented  with complaints of tingling involving fingers of left arm. Initial workup included a head CT which showed no acute intracranial abnormality. This was further worked up with an MRI of brain which that reveal acute CVA. Patient's symptoms resolved spontaneously. He reported having episodic spells of nausea and vomiting over the past several months. I wonder this could be related to gastroparesis given history of poorly controlled diabetes mellitus. He expressed his wishes to have this further worked up in the outpatient setting with his primary care provider. Given elevated blood sugars during this hospitalization his insulin 7030 was increased to 30 units subcutaneous twice a day. Please follow-up on his blood sugars in the outpatient setting. He was discharged in stable condition to his home on 07/23/2014.    Discharge Exam: Filed Vitals:   07/23/14 0934  BP: 137/80  Pulse: 85  Temp: 98.4 F (36.9 C)  Resp: 20    General: Patient is in no acute distress, he is awake and alert oriented, states feeling better Cardiovascular: Regular rate and rhythm normal S1-S2 no murmurs rubs or gallops Respiratory: Normal respiratory effort, lungs are clear to auscultation bilaterally Abdomen: Soft nontender nondistended  Discharge Instructions You were cared for by a hospitalist during your hospital stay. If you have any questions about your discharge medications or the care you received while you were in the hospital after you are discharged, you can call the unit and asked to speak with the hospitalist on call if the hospitalist that took care of you is not available. Once you are discharged, your primary care physician will handle any further medical issues. Please note that NO REFILLS for any discharge  medications will be authorized once you are discharged, as it is imperative that you return to your primary care physician (or establish a relationship with a primary care physician if you do not have one) for  your aftercare needs so that they can reassess your need for medications and monitor your lab values.  Discharge Instructions   Call MD for:  difficulty breathing, headache or visual disturbances    Complete by:  As directed      Call MD for:  extreme fatigue    Complete by:  As directed      Call MD for:  hives    Complete by:  As directed      Call MD for:  persistant dizziness or light-headedness    Complete by:  As directed      Call MD for:  persistant nausea and vomiting    Complete by:  As directed      Call MD for:  redness, tenderness, or signs of infection (pain, swelling, redness, odor or green/yellow discharge around incision site)    Complete by:  As directed      Call MD for:  severe uncontrolled pain    Complete by:  As directed      Call MD for:  temperature >100.4    Complete by:  As directed      Diet - low sodium heart healthy    Complete by:  As directed      Discharge instructions    Complete by:  As directed   Please keep your scheduled appointment with your PCP next week     Increase activity slowly    Complete by:  As directed           Current Discharge Medication List    CONTINUE these medications which have CHANGED   Details  insulin NPH-regular Human (NOVOLIN 70/30) (70-30) 100 UNIT/ML injection Inject 30 Units into the skin 2 (two) times daily with a meal. Qty: 10 mL, Refills: 11      CONTINUE these medications which have NOT CHANGED   Details  albuterol (PROAIR HFA) 108 (90 BASE) MCG/ACT inhaler Inhale 1-2 puffs into the lungs every 3 (three) hours as needed for wheezing or shortness of breath.    aspirin EC 81 MG tablet Take 81 mg by mouth daily.    aspirin-sod bicarb-citric acid (ALKA-SELTZER) 325 MG TBEF tablet Take 650 mg by mouth 2 (two) times daily as needed (nausea).     ibuprofen (ADVIL,MOTRIN) 800 MG tablet Take 800 mg by mouth every 8 (eight) hours as needed for headache (pain).     lisinopril (PRINIVIL,ZESTRIL) 10 MG tablet Take  10 mg by mouth daily.     metFORMIN (GLUCOPHAGE) 500 MG tablet Take 500 mg by mouth 2 (two) times daily with a meal.    glucose blood test strip Use as instructed Qty: 100 each, Refills: 12    glucose monitoring kit (FREESTYLE) monitoring kit 1 each by Does not apply route as needed for other. Qty: 1 each, Refills: 2    Insulin Syringes, Disposable, U-100 0.5 ML MISC 200 Qty: 200 each, Refills: 10    Lancets (FREESTYLE) lancets Use as instructed Qty: 100 each, Refills: 12       Allergies  Allergen Reactions  . Slo-Bid Gyrocaps [Theophylline] Other (See Comments)    Unknown childhood reaction  . Theophyllines Other (See Comments)    Unknown childhood reaction   Follow-up Information   Follow up with Philis Fendt, MD  In 1 week.   Specialty:  Internal Medicine   Contact information:   [address] [place] [address] [phone]        The results of significant diagnostics from this hospitalization (including imaging, microbiology, ancillary and laboratory) are listed below for reference.    Significant Diagnostic Studies: Dg Chest 2 View  06/28/2014   CLINICAL DATA:  Dizziness and hyperglycemia  EXAM: CHEST  2 VIEW  COMPARISON:  March 31, 2013.  FINDINGS: Lungs are clear. Heart size and pulmonary vascularity are normal. No adenopathy. There is thoracic lordosis.  IMPRESSION: No edema or consolidation.   Electronically Signed   By: Lowella Grip M.D.   On: 06/28/2014 13:50   Ct Head Wo Contrast  07/22/2014   CLINICAL DATA:  Slurred speech for 2 days.  Tingling in right hand.  EXAM: CT HEAD WITHOUT CONTRAST  TECHNIQUE: Contiguous axial images were obtained from the base of the skull through the vertex without intravenous contrast.  COMPARISON:  09/09/2011  FINDINGS: No acute intracranial abnormality. Specifically, no hemorrhage, hydrocephalus, mass lesion, acute infarction, or significant intracranial injury. No acute calvarial abnormality. Visualized paranasal  sinuses and mastoids clear. Orbital soft tissues unremarkable.  IMPRESSION: No acute intracranial abnormality.   Electronically Signed   By: Rolm Baptise M.D.   On: 07/22/2014 10:19   Mr Brain Wo Contrast  07/22/2014   CLINICAL DATA:  Numbness and tingling left arm.  Nausea and vomiting  EXAM: MRI HEAD WITHOUT CONTRAST  TECHNIQUE: Multiplanar, multiecho pulse sequences of the brain and surrounding structures were obtained without intravenous contrast.  COMPARISON:  CT head 07/22/2014  FINDINGS: Cerebral volume is normal. Ventricle size is normal. Pituitary normal in size. Craniocervical junction is normal.  Negative for acute or chronic infarction  Negative for demyelinating disease. Cerebral white matter is normal. Brainstem and cerebellum are normal.  Negative for hemorrhage or mass lesion. No edema or shift of the midline structures.  Mucous retention cyst in the maxillary sinus bilaterally  IMPRESSION: Normal MRI of the brain without contrast  Mucous retention cysts in the maxillary sinus bilaterally.   Electronically Signed   By: Franchot Gallo M.D.   On: 07/22/2014 18:32   US Abdomen Limited  06/28/2014   CLINICAL DATA:  Right upper quadrant pain  EXAM: US ABDOMEN LIMITED - RIGHT UPPER QUADRANT  COMPARISON:  None.  FINDINGS: Gallbladder:  No gallstones or wall thickening visualized. No sonographic Murphy sign noted.  Common bile duct:  Diameter: 7.2 mm in diameter.  Liver:  Liver it is enlarged with heterogeneous and increased echogenic signal throughout. No focal mass.  IMPRESSION: Borderline dilatation of the common bile duct. Correlate with liver function tests as for the possibility of biliary obstruction.  Hyperechoic and heterogeneous likely due to diffuse hepatic steatosis. Diffuse hepatic parenchymal disease can have a similar appearance.   Electronically Signed   By: Maryclare Bean M.D.   On: 06/28/2014 16:26    Microbiology: No results found for this or any previous visit (from the past 240  hour(s)).   Labs: Basic Metabolic Panel:  Recent Labs Lab 07/22/14 0935 07/22/14 1952 07/23/14 0810  NA 136*  --  135*  K 4.3  --  4.4  CL 99  --  98  CO2 22  --  24  GLUCOSE 266*  --  239*  BUN 10  --  10  CREATININE 0.50 0.73 0.51  CALCIUM 9.2  --  9.2   Liver Function Tests:  Recent Labs  Lab 07/22/14 0935  AST 14  ALT 16  ALKPHOS 119*  BILITOT 0.6  PROT 7.8  ALBUMIN 3.6   No results found for this basename: LIPASE, AMYLASE,  in the last 168 hours No results found for this basename: AMMONIA,  in the last 168 hours CBC:  Recent Labs Lab 07/22/14 0935 07/22/14 1952 07/23/14 0810  WBC 8.8 10.3 9.0  NEUTROABS 4.7  --   --   HGB 14.4 14.4 14.8  HCT 44.0 43.5 44.9  MCV 81.9 81.2 83.3  PLT 367 365 360   Cardiac Enzymes:  Recent Labs Lab 07/22/14 0935  TROPONINI <0.30   BNP: BNP (last 3 results) No results found for this basename: PROBNP,  in the last 8760 hours CBG:  Recent Labs Lab 07/22/14 1855 07/22/14 2115  GLUCAP 165* 245*       Signed:  Callum Wolf  [facility] 07/23/2014, 10:51 AM

## 2014-07-23 NOTE — Progress Notes (Signed)
UR completed 

## 2014-07-23 NOTE — Progress Notes (Signed)
Patient d/c this am, Assessments remains unchanged. Education and prescription given to patient.

## 2014-12-17 ENCOUNTER — Encounter (HOSPITAL_BASED_OUTPATIENT_CLINIC_OR_DEPARTMENT_OTHER): Payer: Self-pay

## 2014-12-17 ENCOUNTER — Emergency Department (HOSPITAL_BASED_OUTPATIENT_CLINIC_OR_DEPARTMENT_OTHER)
Admission: EM | Admit: 2014-12-17 | Discharge: 2014-12-17 | Disposition: A | Discharge status: Home / Self Care | Payer: Managed Care, Other (non HMO) | Attending: Emergency Medicine | Admitting: Emergency Medicine

## 2014-12-17 DIAGNOSIS — E119 Type 2 diabetes mellitus without complications: Secondary | ICD-10-CM | POA: Diagnosis not present

## 2014-12-17 DIAGNOSIS — E000 Congenital iodine-deficiency syndrome, neurological type: Secondary | ICD-10-CM | POA: Diagnosis not present

## 2014-12-17 DIAGNOSIS — Z87891 Personal history of nicotine dependence: Secondary | ICD-10-CM | POA: Insufficient documentation

## 2014-12-17 DIAGNOSIS — R0602 Shortness of breath: Secondary | ICD-10-CM | POA: Diagnosis present

## 2014-12-17 DIAGNOSIS — J45901 Unspecified asthma with (acute) exacerbation: Secondary | ICD-10-CM

## 2014-12-17 DIAGNOSIS — I10 Essential (primary) hypertension: Secondary | ICD-10-CM | POA: Insufficient documentation

## 2014-12-17 DIAGNOSIS — Z794 Long term (current) use of insulin: Secondary | ICD-10-CM | POA: Diagnosis not present

## 2014-12-17 DIAGNOSIS — Z7982 Long term (current) use of aspirin: Secondary | ICD-10-CM | POA: Insufficient documentation

## 2014-12-17 DIAGNOSIS — Z79899 Other long term (current) drug therapy: Secondary | ICD-10-CM | POA: Insufficient documentation

## 2014-12-17 DIAGNOSIS — Z8659 Personal history of other mental and behavioral disorders: Secondary | ICD-10-CM | POA: Diagnosis not present

## 2014-12-17 DIAGNOSIS — E669 Obesity, unspecified: Secondary | ICD-10-CM | POA: Insufficient documentation

## 2014-12-17 MED ORDER — FLUTICASONE PROPIONATE HFA 44 MCG/ACT IN AERO: 2.0000 | INHALATION_SPRAY | Freq: Two times a day (BID) | RESPIRATORY_TRACT | Status: DC

## 2014-12-17 MED ORDER — SODIUM CHLORIDE 0.9 % IV SOLN
INTRAVENOUS | Status: DC
  Administered 2014-12-17: 19:00:00 via INTRAVENOUS

## 2014-12-17 MED ORDER — MAGNESIUM SULFATE 2 GM/50ML IV SOLN
2.0000 g | Freq: Once | INTRAVENOUS | Status: AC
  Administered 2014-12-17: 2 g via INTRAVENOUS
  Filled 2014-12-17: qty 50

## 2014-12-17 MED ORDER — PREDNISONE 20 MG PO TABS: 40.0000 mg | ORAL_TABLET | Freq: Every day | ORAL | Status: DC

## 2014-12-17 MED ORDER — METHYLPREDNISOLONE SODIUM SUCC 125 MG IJ SOLR
125.0000 mg | Freq: Once | INTRAMUSCULAR | Status: AC
  Administered 2014-12-17: 125 mg via INTRAVENOUS
  Filled 2014-12-17: qty 2

## 2014-12-17 MED ORDER — IPRATROPIUM-ALBUTEROL 0.5-2.5 (3) MG/3ML IN SOLN
3.0000 mL | RESPIRATORY_TRACT | Status: DC
  Administered 2014-12-17: 3 mL via RESPIRATORY_TRACT
  Filled 2014-12-17: qty 3

## 2014-12-17 NOTE — ED Provider Notes (Signed)
CSN: [id number]     Arrival date & time 12/17/14  1757 History  This chart was scribed for a non-physician practitioner, Monico Blitz, PA-C working with Quintella Reichert, MD by Martinique Peace, ED Scribe. The patient was seen in MH07/[facility]07. The patient's care was started at 6:04 PM.    Chief Complaint  Patient presents with  . Shortness of Breath      Patient is a 39 y.o. male presenting with shortness of breath. The history is provided by the patient. No language interpreter was used.  Shortness of Breath Associated symptoms: wheezing   Associated symptoms: no chest pain    HPI Comments: Michael Foster is a 39 y.o. male who presents to the Emergency Department complaining of SOB onset today with wheezing. Pt states he has also been suffering from allergies and took Claritin 24 hours to address symptoms. He explains that medication treated allergies but did not do anything for his trouble breathing. He notes he has also been using his Albuterol inhaler about 2x an hr since onset without much relief. No complaints of chest pain, fever, chills, cough. History of asthma, IDDM, and hypertension. He is not been hospitalized from asthma since he was a child, no history of intubations. He states he's been checking his pulse ox at home and it is been air 100%. Eyes history of DVT or PE, recent immobilization, calf pain or leg swelling, pleuritic chest pain, cough, hemoptysis.  Past Medical History  Diagnosis Date  . Asthma   . Diabetes mellitus   . Hypertension   . Anxiety   . Vertigo    Past Surgical History  Procedure Laterality Date  . Mouth surgery     Family History  Problem Relation Age of Onset  . COPD Father   . Hypertension Father   . Heart failure Father   . Diabetes Father   . Sleep apnea Brother    History  Substance Use Topics  . Smoking status: Former Smoker -- 0.30 packs/day for 10 years    Types: Cigarettes    Quit date: 05/28/2013  . Smokeless tobacco: Never Used   . Alcohol Use: No    Review of Systems  Respiratory: Positive for shortness of breath and wheezing.   Cardiovascular: Negative for chest pain.  All other systems reviewed and are negative.     Allergies  Slo-bid gyrocaps and Theophyllines  Home Medications   Prior to Admission medications   Medication Sig Start Date End Date Taking? Authorizing Provider  albuterol (PROAIR HFA) 108 (90 BASE) MCG/ACT inhaler Inhale 1-2 puffs into the lungs every 3 (three) hours as needed for wheezing or shortness of breath.    Historical Provider, MD  aspirin EC 81 MG tablet Take 81 mg by mouth daily.    Historical Provider, MD  aspirin-sod bicarb-citric acid (ALKA-SELTZER) 325 MG TBEF tablet Take 650 mg by mouth 2 (two) times daily as needed (nausea).     Historical Provider, MD  fluticasone (FLOVENT HFA) 44 MCG/ACT inhaler Inhale 2 puffs into the lungs 2 (two) times daily. 12/17/14   Gwin Eagon, PA-C  glucose blood test strip Use as instructed 09/20/13   Reyne Dumas, MD  glucose monitoring kit (FREESTYLE) monitoring kit 1 each by Does not apply route as needed for other. 09/20/13   Reyne Dumas, MD  ibuprofen (ADVIL,MOTRIN) 800 MG tablet Take 800 mg by mouth every 8 (eight) hours as needed for headache (pain).     Historical Provider, MD  insulin NPH-regular Human (  NOVOLIN 70/30) (70-30) 100 UNIT/ML injection Inject 30 Units into the skin 2 (two) times daily with a meal. 07/23/14   Kelvin Cellar, MD  Insulin Syringes, Disposable, U-100 0.5 ML MISC 200 09/20/13   Reyne Dumas, MD  Lancets (FREESTYLE) lancets Use as instructed 09/20/13   Reyne Dumas, MD  lisinopril (PRINIVIL,ZESTRIL) 10 MG tablet Take 10 mg by mouth daily.  06/05/14   Historical Provider, MD  metFORMIN (GLUCOPHAGE) 500 MG tablet Take 500 mg by mouth 2 (two) times daily with a meal.    Historical Provider, MD  predniSONE (DELTASONE) 20 MG tablet Take 2 tablets (40 mg total) by mouth daily. 12/17/14   Akansha Wyche, PA-C   BP  156/78 mmHg  Pulse 111  Temp(Src) 98.7 F (37.1 C) (Oral)  Resp 22  Ht '5\' 9"'  (1.753 m)  Wt 325 lb (147.419 kg)  BMI 47.97 kg/m2  SpO2 100% Physical Exam  Constitutional: He is oriented to person, place, and time. He appears well-developed and well-nourished. No distress.  Obese  HENT:  Head: Normocephalic and atraumatic.  Eyes: Conjunctivae and EOM are normal.  Neck: Neck supple. No tracheal deviation present.  Cardiovascular: Regular rhythm and intact distal pulses.   Mild tachycardia  Pulmonary/Chest: Effort normal. No respiratory distress.  Mild tachypnea and tripoding  Poor air movement in all fields, diffuse expiratory wheezing is worse on the right than the left.  Musculoskeletal: Normal range of motion. He exhibits no edema or tenderness.  No calf asymmetry, superficial collaterals, palpable cords, edema, Homans sign negative bilaterally.    Neurological: He is alert and oriented to person, place, and time.  Skin: Skin is warm and dry.  Psychiatric: He has a normal mood and affect. His behavior is normal.  Nursing note and vitals reviewed.   ED Course  Procedures (including critical care time) Labs Review Labs Reviewed - No data to display  Imaging Review No results found.   EKG Interpretation None     Medications  0.9 %  sodium chloride infusion ( Intravenous New Bag/Given 12/17/14 1851)  methylPREDNISolone sodium succinate (SOLU-MEDROL) 125 mg/2 mL injection 125 mg (125 mg Intravenous Given 12/17/14 1822)  magnesium sulfate IVPB 2 g 50 mL (2 g Intravenous New Bag/Given 12/17/14 1825)    6:08 PM- Treatment plan was discussed with patient who verbalizes understanding and agrees.   MDM   Final diagnoses:  Asthma exacerbation    Filed Vitals:   12/17/14 1800 12/17/14 1801 12/17/14 1825 12/17/14 1853  BP:  156/78  165/92  Pulse:  111  111  Temp:  98.7 F (37.1 C)    TempSrc:  Oral    Resp:  22  18  Height: '5\' 9"'  (1.753 m)     Weight: 325 lb (147.419  kg)     SpO2:  100% 99% 98%    Medications  0.9 %  sodium chloride infusion ( Intravenous New Bag/Given 12/17/14 1851)  methylPREDNISolone sodium succinate (SOLU-MEDROL) 125 mg/2 mL injection 125 mg (125 mg Intravenous Given 12/17/14 1822)  magnesium sulfate IVPB 2 g 50 mL (2 g Intravenous New Bag/Given 12/17/14 1825)    Delfino Lovett DOMENIQUE SOUTHERS is a pleasant 39 y.o. male presenting with asthma exacerbation wheezing over the course last 24 hours. Patient has been having slight rhinorrhea and he thinks this is secondary to his allergies, he's been giving his albuterol inhaler every 30 minutes at home with little relief. Patient is moving very poor air in all fields, diffuse, expiratory wheezing worse on the  right than the left. Patient is a diabetic but he will need Solu-Medrol, magnesium, DuoNeb and then will reassess. No signs of infection, afebrile no cough, no indication for chest x-ray at this time.  Patient's wheezing has resolved after first DuoNeb, her movement is still slightly reduced in all fields. Give second DuoNeb as magnesium infuses.  Lung sounds after second nebulizer with much improved air movement. No wheezing.  I've advised patient that I will write him a prescription for Flovent steroid inhaler, I've advised him that this is not a rescue inhaler and he is not to use it in an acute exacerbation. Patient is verbalizes understanding. Of advised Melanie to rinse his mouth after use a steroid inhaler. Patient will be given prednisone burst, they advised him that this will likely increase his blood sugars, he left her follow closely with his primary care physician we have discussed return precautions to the ED.  Evaluation does not show pathology that would require ongoing emergent intervention or inpatient treatment. Pt is hemodynamically stable and mentating appropriately. Discussed findings and plan with patient/guardian, who agrees with care plan. All questions answered. Return precautions  discussed and outpatient follow up given.   New Prescriptions   FLUTICASONE (FLOVENT HFA) 44 MCG/ACT INHALER    Inhale 2 puffs into the lungs 2 (two) times daily.   PREDNISONE (DELTASONE) 20 MG TABLET    Take 2 tablets (40 mg total) by mouth daily.     I personally performed the services described in this documentation, which was scribed in my presence. The recorded information has been reviewed and is accurate.   Monico Blitz, PA-C 12/17/14 1928  Quintella Reichert, MD 12/17/14 2036

## 2014-12-17 NOTE — ED Notes (Signed)
Pt reports hx of asthma and today with wheezing and sob.  Reports using albuterol rescue inhaler last 3-4 hours multiple times hourly without relief.

## 2014-12-17 NOTE — Discharge Instructions (Signed)
Drink plenty of fluids and check your blood sugar frequently. If it goes over 400 come to the emergency room if you cannot get into see her primary care physician.  Follow with your primary care doctor in the next 48 hours.  Do not hesitate to return to the emergency room for any new, worsening or concerning symptoms.  After you take the Flovent steroid inhaler rinse her mouth out with water.    Asthma, Acute Bronchospasm Acute bronchospasm caused by asthma is also referred to as an asthma attack. Bronchospasm means your air passages become narrowed. The narrowing is caused by inflammation and tightening of the muscles in the air tubes (bronchi) in your lungs. This can make it hard to breathe or cause you to wheeze and cough. CAUSES Possible triggers are:  Animal dander from the skin, hair, or feathers of animals.  Dust mites contained in house dust.  Cockroaches.  Pollen from trees or grass.  Mold.  Cigarette or tobacco smoke.  Air pollutants such as dust, household cleaners, hair sprays, aerosol sprays, paint fumes, strong chemicals, or strong odors.  Cold air or weather changes. Cold air may trigger inflammation. Winds increase molds and pollens in the air.  Strong emotions such as crying or laughing hard.  Stress.  Certain medicines such as aspirin or beta-blockers.  Sulfites in foods and drinks, such as dried fruits and wine.  Infections or inflammatory conditions, such as a flu, cold, or inflammation of the nasal membranes (rhinitis).  Gastroesophageal reflux disease (GERD). GERD is a condition where stomach acid backs up into your esophagus.  Exercise or strenuous activity. SIGNS AND SYMPTOMS   Wheezing.  Excessive coughing, particularly at night.  Chest tightness.  Shortness of breath. DIAGNOSIS  Your health care provider will ask you about your medical history and perform a physical exam. A chest X-ray or blood testing may be performed to look for other  causes of your symptoms or other conditions that may have triggered your asthma attack. TREATMENT  Treatment is aimed at reducing inflammation and opening up the airways in your lungs. Most asthma attacks are treated with inhaled medicines. These include quick relief or rescue medicines (such as bronchodilators) and controller medicines (such as inhaled corticosteroids). These medicines are sometimes given through an inhaler or a nebulizer. Systemic steroid medicine taken by mouth or given through an IV tube also can be used to reduce the inflammation when an attack is moderate or severe. Antibiotic medicines are only used if a bacterial infection is present.  HOME CARE INSTRUCTIONS   Rest.  Drink plenty of liquids. This helps the mucus to remain thin and be easily coughed up. Only use caffeine in moderation and do not use alcohol until you have recovered from your illness.  Do not smoke. Avoid being exposed to secondhand smoke.  You play a critical role in keeping yourself in good health. Avoid exposure to things that cause you to wheeze or to have breathing problems.  Keep your medicines up-to-date and available. Carefully follow your health care provider's treatment plan.  Take your medicine exactly as prescribed.  When pollen or pollution is bad, keep windows closed and use an air conditioner or go to places with air conditioning.  Asthma requires careful medical care. See your health care provider for a follow-up as advised. If you are more than [redacted] weeks pregnant and you were prescribed any new medicines, let your obstetrician know about the visit and how you are doing. Follow up with your  health care provider as directed.  After you have recovered from your asthma attack, make an appointment with your outpatient doctor to talk about ways to reduce the likelihood of future attacks. If you do not have a doctor who manages your asthma, make an appointment with a primary care doctor to  discuss your asthma. SEEK IMMEDIATE MEDICAL CARE IF:   You are getting worse.  You have trouble breathing. If severe, call your local emergency services (911 in the [place]  You develop chest pain or discomfort.  You are vomiting.  You are not able to keep fluids down.  You are coughing up yellow, green, brown, or bloody sputum.  You have a fever and your symptoms suddenly get worse.  You have trouble swallowing. MAKE SURE YOU:   Understand these instructions.  Will watch your condition.  Will get help right away if you are not doing well or get worse. Document Released: 12/25/2006 Document Revised: 09/14/2013 Document Reviewed: 03/17/2013 [facility] Patient Information 2015 [place], [place]. This information is not intended to replace advice given to you by your health care provider. Make sure you discuss any questions you have with your health care provider.

## 2015-01-04 ENCOUNTER — Encounter: Payer: Self-pay | Admitting: Family Medicine

## 2015-01-04 ENCOUNTER — Ambulatory Visit: Payer: 59 | Attending: Family Medicine | Admitting: Family Medicine

## 2015-01-04 VITALS — BP 139/82 | HR 87 | Temp 99.0°F | Resp 18 | Ht 69.0 in | Wt 304.0 lb

## 2015-01-04 DIAGNOSIS — E119 Type 2 diabetes mellitus without complications: Secondary | ICD-10-CM | POA: Diagnosis not present

## 2015-01-04 DIAGNOSIS — Z87891 Personal history of nicotine dependence: Secondary | ICD-10-CM | POA: Diagnosis not present

## 2015-01-04 DIAGNOSIS — Z8673 Personal history of transient ischemic attack (TIA), and cerebral infarction without residual deficits: Secondary | ICD-10-CM

## 2015-01-04 DIAGNOSIS — I10 Essential (primary) hypertension: Secondary | ICD-10-CM | POA: Diagnosis not present

## 2015-01-04 DIAGNOSIS — Z114 Encounter for screening for human immunodeficiency virus [HIV]: Secondary | ICD-10-CM | POA: Diagnosis not present

## 2015-01-04 DIAGNOSIS — Z720 Tobacco use: Secondary | ICD-10-CM

## 2015-01-04 DIAGNOSIS — J4540 Moderate persistent asthma, uncomplicated: Secondary | ICD-10-CM

## 2015-01-04 DIAGNOSIS — F17200 Nicotine dependence, unspecified, uncomplicated: Secondary | ICD-10-CM

## 2015-01-04 DIAGNOSIS — Z23 Encounter for immunization: Secondary | ICD-10-CM

## 2015-01-04 DIAGNOSIS — J45909 Unspecified asthma, uncomplicated: Secondary | ICD-10-CM | POA: Diagnosis not present

## 2015-01-04 HISTORY — DX: Personal history of transient ischemic attack (TIA), and cerebral infarction without residual deficits: Z86.73

## 2015-01-04 LAB — POCT GLYCOSYLATED HEMOGLOBIN (HGB A1C): Hemoglobin A1C: 10.9

## 2015-01-04 LAB — LIPID PANEL
CHOL/HDL RATIO: 5.3 ratio
Cholesterol: 154 mg/dL (ref 0–200)
HDL: 29 mg/dL — ABNORMAL LOW (ref >=40)
LDL Cholesterol: 101 mg/dL — ABNORMAL HIGH (ref 0–99)
TRIGLYCERIDES: 119 mg/dL (ref <150)
VLDL: 24 mg/dL (ref 0–40)

## 2015-01-04 LAB — GLUCOSE, POCT (MANUAL RESULT ENTRY): POC GLUCOSE: 214 mg/dL — AB (ref 70–99)

## 2015-01-04 MED ORDER — METFORMIN HCL 1000 MG PO TABS: 1000.0000 mg | ORAL_TABLET | Freq: Two times a day (BID) | ORAL | Status: DC

## 2015-01-04 MED ORDER — LISINOPRIL 2.5 MG PO TABS: 2.5000 mg | ORAL_TABLET | Freq: Every day | ORAL | Status: DC

## 2015-01-04 MED ORDER — BECLOMETHASONE DIPROPIONATE 40 MCG/ACT IN AERS: 1.0000 | INHALATION_SPRAY | Freq: Two times a day (BID) | RESPIRATORY_TRACT | Status: DC

## 2015-01-04 NOTE — Progress Notes (Signed)
Establish Care Hx DM, Asthma

## 2015-01-04 NOTE — Assessment & Plan Note (Signed)
A; BP at goal P: start lisinopril 2.5 mg daily for renal protect in diabetes

## 2015-01-04 NOTE — Progress Notes (Signed)
   Subjective:    Patient ID: Michael Foster, male    DOB: 01/04/1976, 39 y.o.   MRN: [id number] CC: f/u diabetes. HTN, asthma  HPI  1. CHRONIC DIABETES  Disease Monitoring  Blood Sugar Ranges: 90-230  Polyuria: no   Visual problems: no   Medication Compliance: yes  Medication Side Effects  Hypoglycemia: no   Preventitive Health Care  Eye Exam: great, done recently   Foot Exam: done today  Diet pattern: overeats at times. Working on portions.   Exercise: daily   2. CHRONIC HYPERTENSION  Disease Monitoring  Blood pressure range: does not check   Chest pain: no   Dyspnea: no   Claudication: no   Medication compliance: no, lisinopril made him feel lightheaded  Medication Side Effects  Lightheadedness: no   Urinary frequency: no   Edema: no   Impotence: no   Preventitive Healthcare:  Exercise: yes daily    Diet Pattern: over eats/binges at times. Working on portion control    2. Asthma: has exacerbation last month.required prednisone. Using albuterol daily. Taking zyrtec daily. No controller medicine. Spring is most symptomatic time for patient.   Soc Hx: former smoker, quit  Review of Systems     Objective:   Physical Exam BP 139/82 mmHg  Pulse 87  Temp(Src) 99 F (37.2 C) (Oral)  Resp 18  Ht 5\' 9"  (1.753 m)  Wt 304 lb (137.893 kg)  BMI 44.87 kg/m2  SpO2 96% General appearance: alert, cooperative, no distress and morbidly obese Neck: no adenopathy, supple, symmetrical, trachea midline and thyroid not enlarged, symmetric, no tenderness/mass/nodules Lungs: clear to auscultation bilaterally Heart: regular rate and rhythm, S1, S2 normal, no murmur, click, rub or gallop Extremities: extremities normal, atraumatic, no cyanosis or edema   Lab Results  Component Value Date   HGBA1C 10.90 01/04/2015   CBG 214    Assessment & Plan:

## 2015-01-04 NOTE — Assessment & Plan Note (Signed)
A: Asthma chronic, moderate with daily albuterol usage P: Continue zyrtec 10 mg daily  QVAR 40 mg twice daily

## 2015-01-04 NOTE — Patient Instructions (Addendum)
Mr. Michael Foster,  Thank you for coming in today. It was a pleasure meeting you. I look forward to being your primary doctor.  1. Diabetes: A1c goal < 7 Diabetes blood sugar goals  Fasting (in AM before breakfast, 8 hrs of no eating or drinking (except water or unsweetened coffee or tea): 90-110 After meals: < 160,  2 hrs after meals  No low sugars: nothing < 70   Increase metformin to 1000 mg twice daily  Start lisinopril 2.5 mg daily   2. Asthma: Continue zyrtec 10 mg daily  QVAR 40 mg twice daily   You will be called with lab results   F/u in 6 weeks with nurse  F/u in 3 months with me     Dr. Armen PickupFunches

## 2015-01-04 NOTE — Assessment & Plan Note (Signed)
  1. Diabetes: A1c goal < 7 Diabetes blood sugar goals  Fasting (in AM before breakfast, 8 hrs of no eating or drinking (except water or unsweetened coffee or tea): 90-110 After meals: < 160,  2 hrs after meals  No low sugars: nothing < 70   Increase metformin to 1000 mg twice daily  Start lisinopril 2.5 mg daily

## 2015-01-04 NOTE — Assessment & Plan Note (Signed)
Screening done today.  

## 2015-01-04 NOTE — Assessment & Plan Note (Signed)
No longer smoking 

## 2015-01-05 ENCOUNTER — Telehealth: Payer: Self-pay | Admitting: *Deleted

## 2015-01-05 LAB — MICROALBUMIN, URINE: Microalb, Ur: 7.9 mg/dL — ABNORMAL HIGH (ref <2.0)

## 2015-01-05 LAB — HIV ANTIBODY (ROUTINE TESTING W REFLEX): HIV 1&2 Ab, 4th Generation: NONREACTIVE

## 2015-01-05 NOTE — Telephone Encounter (Signed)
Pt aware of results 

## 2015-01-05 NOTE — Telephone Encounter (Signed)
-----   Message from Doris Cheadleeepak Advani, MD sent at 01/05/2015 12:30 PM EDT ----- Call and let the patient know that his HIV test is negative. Lipid panel shows improvement in triglyceride level. Urine microalbumin is positive likely secondary to uncontrolled DM, needs better diet control, also apparently  has been started on lisinopril for kidney protection, patient will need to take it regularly

## 2015-01-09 ENCOUNTER — Encounter: Payer: 59 | Attending: Internal Medicine | Admitting: Dietician

## 2015-01-09 DIAGNOSIS — Z713 Dietary counseling and surveillance: Secondary | ICD-10-CM | POA: Diagnosis not present

## 2015-01-09 DIAGNOSIS — E118 Type 2 diabetes mellitus with unspecified complications: Secondary | ICD-10-CM | POA: Diagnosis present

## 2015-01-09 DIAGNOSIS — Z794 Long term (current) use of insulin: Secondary | ICD-10-CM | POA: Diagnosis not present

## 2015-01-09 NOTE — Progress Notes (Signed)
  Medical Nutrition Therapy:  Appt start time: 415 end time:  530   Assessment:  Primary concerns today: Michael Foster states that he is here because he was told that if he can get his diabetes under control he can get his medications for free. He states he has not received formal diabetes education in the past. He is exercising daily and is getting 10,000 steps almost every day. Tests his blood sugars 2-3x a day. Morning fasting blood sugars usually between 160-210 mg/dL. He states he has lost weight recently and HTN and high cholesterol have improved. Compliant with diabetes medications. Has given up sweets and describes this as "anything that comes from a wrapper that is meant to be used as a snack." His wife does most of the grocery shopping and cooking. However, many of his wife's family members live in the home as well and Gerlene BurdockRichard sometimes avoids going home after work.  Preferred Learning Style:   No preference indicated   Learning Readiness:   Contemplating  MEDICATIONS: see list   DIETARY INTAKE:  Usual eating pattern includes 2 meals and 1 snacks per day.  Avoided foods include sugary drinks.    24-hr recall:  (6:30-7 AM): Humulin and Metformin (does not eat anything at this time)  (AM): water and coffee throughout the morning L (1:30-2 PM): leftovers OR hotdog and 2 orders of fries OR footlong chicken salad sub from [employer] OR 3 McDoubles from [employer]  Snk (5-7 PM): baked or fried chicken OR meatloaf D (9-10 PM): spam sandwich, soup  Goes to bed around midnight  Beverages: water, coffee with non fat milk and Splenda, occasionally Diet Mtn Dew  Usual physical activity: 1-2 miles of walking in the mornings 3-4x a week   Estimated energy needs: 2000-2200 calories 225-248 g carbohydrates (recommended 3-4 servings of CHO at meals and 1-2 servings at snacks) 150-165 g protein 56-61 g fat  Progress Towards Goal(s):  In progress.   Nutritional Diagnosis:  NB-1.1 Food and  nutrition-related knowledge deficit As related to patient skipping meals and eating large amounts of food later in the day.  As evidenced by diet recall and patient reported fear of weight gain and elevated blood sugars.    Intervention:  Nutrition education provided. Explained carbohydrate metabolism and glucose uptake. Emphasized the importance of regular meals including both carbohydrates and protein. Identified sources of carbohydrates and demonstrated appropriate portion sizes using food models. The patient seems skeptical of the recommendations, reporting a fear of weight gain and elevated blood sugars.    Patient goals: -Have a protein food with each meal and snack -Work on eating 3 meals a day, starting within an hour of waking up -Try having a protein shake for breakfast (Atkins Advantage, EAS AdvantEdge, or Premier) -Continue exercise routine -Snacks: prepackaged cheese, cottage cheese and fruit, try hummus, small amount of trail mix   Teaching Method Utilized:  Visual Auditory Hands on  Handouts given during visit include:  MyPlate  Meal planning card  Barriers to learning/adherence to lifestyle change: knowledge deficit and suspected binge-eating tendencies  Demonstrated degree of understanding via:  Teach Back   Monitoring/Evaluation:  Dietary intake, exercise, labs, and body weight in 4 week(s).

## 2015-01-09 NOTE — Patient Instructions (Addendum)
-  Have a protein food with each meal and snack -Work on eating 3 meals a day, starting within an hour of waking up -Try having a protein shake for breakfast  -Continue exercise routine -Snacks: prepackaged cheese, cottage cheese and fruit, try hummus, small amount of trail mix

## 2015-01-10 ENCOUNTER — Encounter: Payer: Self-pay | Admitting: Dietician

## 2015-01-26 ENCOUNTER — Encounter (HOSPITAL_COMMUNITY): Payer: Self-pay

## 2015-01-26 ENCOUNTER — Emergency Department (HOSPITAL_COMMUNITY): Payer: Managed Care, Other (non HMO)

## 2015-01-26 ENCOUNTER — Emergency Department (HOSPITAL_COMMUNITY)
Admission: EM | Admit: 2015-01-26 | Discharge: 2015-01-26 | Disposition: A | Discharge status: Home / Self Care | Payer: Managed Care, Other (non HMO) | Attending: Emergency Medicine | Admitting: Emergency Medicine

## 2015-01-26 DIAGNOSIS — J45909 Unspecified asthma, uncomplicated: Secondary | ICD-10-CM | POA: Diagnosis not present

## 2015-01-26 DIAGNOSIS — R42 Dizziness and giddiness: Secondary | ICD-10-CM | POA: Diagnosis not present

## 2015-01-26 DIAGNOSIS — Z8673 Personal history of transient ischemic attack (TIA), and cerebral infarction without residual deficits: Secondary | ICD-10-CM | POA: Diagnosis not present

## 2015-01-26 DIAGNOSIS — Z794 Long term (current) use of insulin: Secondary | ICD-10-CM | POA: Insufficient documentation

## 2015-01-26 DIAGNOSIS — R200 Anesthesia of skin: Secondary | ICD-10-CM | POA: Diagnosis present

## 2015-01-26 DIAGNOSIS — Z87891 Personal history of nicotine dependence: Secondary | ICD-10-CM | POA: Diagnosis not present

## 2015-01-26 DIAGNOSIS — I10 Essential (primary) hypertension: Secondary | ICD-10-CM | POA: Diagnosis not present

## 2015-01-26 DIAGNOSIS — Z79899 Other long term (current) drug therapy: Secondary | ICD-10-CM | POA: Diagnosis not present

## 2015-01-26 DIAGNOSIS — E119 Type 2 diabetes mellitus without complications: Secondary | ICD-10-CM | POA: Insufficient documentation

## 2015-01-26 LAB — APTT: aPTT: 32 seconds (ref 24–37)

## 2015-01-26 LAB — PROTIME-INR
INR: 1 (ref 0.00–1.49)
Prothrombin Time: 13.3 seconds (ref 11.6–15.2)

## 2015-01-26 LAB — COMPREHENSIVE METABOLIC PANEL
ALT: 18 U/L (ref 17–63)
AST: 16 U/L (ref 15–41)
Albumin: 3.7 g/dL (ref 3.5–5.0)
Alkaline Phosphatase: 108 U/L (ref 38–126)
Anion gap: 8 (ref 5–15)
BUN: 7 mg/dL (ref 6–20)
CALCIUM: 9.1 mg/dL (ref 8.9–10.3)
CO2: 24 mmol/L (ref 22–32)
CREATININE: 0.66 mg/dL (ref 0.61–1.24)
Chloride: 104 mmol/L (ref 101–111)
GFR calc Af Amer: >60 mL/min (ref >60)
GLUCOSE: 156 mg/dL — AB (ref 70–99)
Potassium: 4.1 mmol/L (ref 3.5–5.1)
Sodium: 136 mmol/L (ref 135–145)
Total Bilirubin: 0.6 mg/dL (ref 0.3–1.2)
Total Protein: 7.5 g/dL (ref 6.5–8.1)

## 2015-01-26 LAB — CBC
HEMATOCRIT: 44.9 % (ref 39.0–52.0)
Hemoglobin: 14.8 g/dL (ref 13.0–17.0)
MCH: 27.2 pg (ref 26.0–34.0)
MCHC: 33 g/dL (ref 30.0–36.0)
MCV: 82.4 fL (ref 78.0–100.0)
PLATELETS: 377 10*3/uL (ref 150–400)
RBC: 5.45 MIL/uL (ref 4.22–5.81)
RDW: 13.7 % (ref 11.5–15.5)
WBC: 12 10*3/uL — AB (ref 4.0–10.5)

## 2015-01-26 LAB — I-STAT CHEM 8, ED
BUN: 7 mg/dL (ref 6–20)
Calcium, Ion: 1.22 mmol/L (ref 1.12–1.23)
Chloride: 100 mmol/L — ABNORMAL LOW (ref 101–111)
Creatinine, Ser: 0.7 mg/dL (ref 0.61–1.24)
Glucose, Bld: 155 mg/dL — ABNORMAL HIGH (ref 70–99)
HCT: 49 % (ref 39.0–52.0)
HEMOGLOBIN: 16.7 g/dL (ref 13.0–17.0)
Potassium: 4.2 mmol/L (ref 3.5–5.1)
Sodium: 139 mmol/L (ref 135–145)
TCO2: 25 mmol/L (ref 0–100)

## 2015-01-26 LAB — DIFFERENTIAL
BASOS PCT: 1 % (ref 0–1)
Basophils Absolute: 0.1 10*3/uL (ref 0.0–0.1)
Eosinophils Absolute: 0.3 10*3/uL (ref 0.0–0.7)
Eosinophils Relative: 3 % (ref 0–5)
LYMPHS ABS: 3 10*3/uL (ref 0.7–4.0)
Lymphocytes Relative: 25 % (ref 12–46)
MONOS PCT: 7 % (ref 3–12)
Monocytes Absolute: 0.9 10*3/uL (ref 0.1–1.0)
Neutro Abs: 7.7 10*3/uL (ref 1.7–7.7)
Neutrophils Relative %: 64 % (ref 43–77)

## 2015-01-26 LAB — CBG MONITORING, ED: GLUCOSE-CAPILLARY: 139 mg/dL — AB (ref 70–99)

## 2015-01-26 LAB — I-STAT TROPONIN, ED: Troponin i, poc: 0 ng/mL (ref 0.00–0.08)

## 2015-01-26 NOTE — ED Provider Notes (Signed)
CSN: [id number]     Arrival date & time 01/26/15  1419 History   First MD Initiated Contact with Patient 01/26/15 1607     Chief Complaint  Patient presents with  . Numbness     (Consider location/radiation/quality/duration/timing/severity/associated sxs/prior Treatment) HPI  Michael Foster is a 39 y.o. male who is here for evaluation of transient dizziness, with a sensation of numbness in the left face, twice in the last 24 hours. First time happened while he was having intercourse, second time happened today while he was walking on a stairstepper. Both episodes lasted less than 5 minutes. There was no associated weakness, altered mental status, near syncope, syncope, chest pain or back pain. He is been working hard on controlling his blood sugar. He is going through a divorce with his wife. He feels like he is under stress. He did not eat much today, for no particular reason. He is taking his usual medications as directed. Recently, his blood sugars and cholesterol, and improved with his efforts to control his weight. He states that his history of TIA, documented from last year, may actually not have been a TIA. He was told that there are other factors at that time that may have caused this symptom. After hospital discharge, he took aspirin for 1 month but stopped on his own. He has not followed up with the neurologist, since then. There are no other known modifying factors.    Past Medical History  Diagnosis Date  . Vertigo   . Asthma     as a child  . Diabetes mellitus Dx 2009  . Hypertension Dx 2009  . H/O TIA (transient ischemic attack) and stroke 01/04/2015    06/2014 with L hand numbness    Past Surgical History  Procedure Laterality Date  . Mouth surgery     Family History  Problem Relation Age of Onset  . COPD Father   . Hypertension Father   . Heart failure Father   . Diabetes Father   . Sleep apnea Brother    History  Substance Use Topics  . Smoking status: Former  Smoker -- 0.30 packs/day for 10 years    Types: Cigarettes    Quit date: 05/28/2013  . Smokeless tobacco: Never Used  . Alcohol Use: No    Review of Systems  All other systems reviewed and are negative.     Allergies  Slo-bid gyrocaps  Home Medications   Prior to Admission medications   Medication Sig Start Date End Date Taking? Authorizing Provider  albuterol (PROAIR HFA) 108 (90 BASE) MCG/ACT inhaler Inhale 1-2 puffs into the lungs every 3 (three) hours as needed for wheezing or shortness of breath.   Yes Historical Provider, MD  aspirin-sod bicarb-citric acid (ALKA-SELTZER) 325 MG TBEF tablet Take 650 mg by mouth every 6 (six) hours as needed.   Yes Historical Provider, MD  glucose blood test strip Use as instructed 09/20/13  Yes Richarda OverlieNayana Abrol, MD  insulin NPH-regular Human (NOVOLIN 70/30) (70-30) 100 UNIT/ML injection Inject 30 Units into the skin 2 (two) times daily with a meal. Patient taking differently: Inject 3-40 Units into the skin 2 (two) times daily as needed (low blood glucose). Units depend on blood glucose level 07/23/14  Yes Jeralyn BennettEzequiel Zamora, MD  Insulin Syringes, Disposable, U-100 0.5 ML MISC 200 09/20/13  Yes Richarda OverlieNayana Abrol, MD  Lancets (FREESTYLE) lancets Use as instructed 09/20/13  Yes Richarda OverlieNayana Abrol, MD  lisinopril (PRINIVIL,ZESTRIL) 2.5 MG tablet Take 1 tablet (2.5 mg total)  by mouth daily. 01/04/15  Yes Josalyn Funches, MD  metFORMIN (GLUCOPHAGE) 500 MG tablet Take 500 mg by mouth 2 (two) times daily with a meal.   Yes Historical Provider, MD  beclomethasone (QVAR) 40 MCG/ACT inhaler Inhale 1 puff into the lungs 2 (two) times daily. Patient not taking: Reported on 01/26/2015 01/04/15   Dessa Phi, MD  ibuprofen (ADVIL,MOTRIN) 800 MG tablet Take 800 mg by mouth every 8 (eight) hours as needed for headache (pain).     Historical Provider, MD  magnesium 30 MG tablet Take 30 mg by mouth 2 (two) times daily.    Historical Provider, MD  metFORMIN (GLUCOPHAGE) 1000 MG  tablet Take 1 tablet (1,000 mg total) by mouth 2 (two) times daily with a meal. Patient not taking: Reported on 01/26/2015 01/04/15   Josalyn Funches, MD   BP 143/84 mmHg  Pulse 89  Temp(Src) 99 F (37.2 C) (Oral)  Resp 18  Ht  (1.753 m)  Wt 310 lb (140.615 kg)  BMI 45.76 kg/m2  SpO2 98% Physical Exam  Constitutional: He is oriented to person, place, and time. He appears well-developed and well-nourished.  HENT:  Head: Normocephalic and atraumatic.  Right Ear: External ear normal.  Left Ear: External ear normal.  Eyes: Conjunctivae and EOM are normal. Pupils are equal, round, and reactive to light.  Neck: Normal range of motion and phonation normal. Neck supple.  Cardiovascular: Normal rate, regular rhythm and normal heart sounds.   Pulmonary/Chest: Effort normal and breath sounds normal. He exhibits no bony tenderness.  Abdominal: Soft. There is no tenderness.  Musculoskeletal: Normal range of motion.  Neurological: He is alert and oriented to person, place, and time. No cranial nerve deficit or sensory deficit. He exhibits normal muscle tone. Coordination normal.  No dysarthria and aphasia or nystagmus. No ataxia. No pronator drift.  Skin: Skin is warm, dry and intact.  Psychiatric: He has a normal mood and affect. His behavior is normal. Judgment and thought content normal.  Nursing note and vitals reviewed.   ED Course  Procedures (including critical care time)  Medications - No data to display  Patient Vitals for the past 24 hrs:  BP Temp Temp src Pulse Resp SpO2 Height Weight  01/26/15 1647 - - - - - 98 % - -  01/26/15 1459 143/84 mmHg 99 F (37.2 C) Oral 89 18 97 %  (1.753 m) (!) 310 lb (140.615 kg)    5:51 PM  Reevaluation with update and discussion. After initial assessment and treatment, an updated evaluation reveals no change in clinical status. Orthostatics are normal. He has no additional complaints. Findings discussed with patient and wife, all  questions answered.Mancel Bale L    Labs Review Labs Reviewed  CBC - Abnormal; Notable for the following:    WBC 12.0 (*)    All other components within normal limits  COMPREHENSIVE METABOLIC PANEL - Abnormal; Notable for the following:    Glucose, Bld 156 (*)    All other components within normal limits  I-STAT CHEM 8, ED - Abnormal; Notable for the following:    Chloride 100 (*)    Glucose, Bld 155 (*)    All other components within normal limits  CBG MONITORING, ED - Abnormal; Notable for the following:    Glucose-Capillary 139 (*)    All other components within normal limits  PROTIME-INR  APTT  DIFFERENTIAL  I-STAT TROPOININ, ED    Imaging Review Ct Head (brain) Wo Contrast  01/26/2015  CLINICAL DATA:  Left-sided numbness and tingling in the left head.  EXAM: CT HEAD WITHOUT CONTRAST  TECHNIQUE: Contiguous axial images were obtained from the base of the skull through the vertex without intravenous contrast.  COMPARISON:  07/22/2014  FINDINGS: Skull and Sinuses:No fracture or destructive process. Known mucous retention cysts in the bilateral maxillary antra. No sinus effusion. No mastoid effusion.  Orbits: No acute abnormality.  Brain: No evidence of acute infarction, hemorrhage, hydrocephalus, or mass lesion/mass effect.  IMPRESSION: Stable, negative head CT.   Electronically Signed   By: Marnee SpringJonathon  Watts M.D.   On: 01/26/2015 16:07     EKG Interpretation None      MDM   Final diagnoses:  Dizziness, nonspecific    Very transient symptoms, associated with exertion, unlikely to represent a TIA or stroke syndrome. He is metabolically stable with mild blood sugar elevation. No evidence for ketosis. Doubt serious bacterial infection or impending vascular collapse.  Nursing Notes Reviewed/ Care Coordinated Applicable Imaging Reviewed Interpretation of Laboratory Data incorporated into ED treatment  The patient appears reasonably screened and/or stabilized for discharge  and I doubt any other medical condition or other [facility] requiring further screening, evaluation, or treatment in the ED at this time prior to discharge.  Plan: Home Medications- usual; Home Treatments- rest, increase fluid intake.; return here if the recommended treatment, does not improve the symptoms; Recommended follow up- PCP checkup in one week.   Mancel BaleElliott Henrik Orihuela, MD 01/26/15 [phone]

## 2015-01-26 NOTE — ED Notes (Signed)
Pt presents with sudden onset of "weakness" to top of head and L side of face that began at 1400 while at work, pt checked CBG (was 103), reports symptoms x 2-3 minutes.  Pt reports before episode, he was on treadmill, did not have symptoms then.  Pt reports symptom is completely resolved, but now has headache to L side.  Pt reports TIA in the past.  Pt also reports similar episode yesterday after having intercourse with wife, reports symptoms lasted approx 2 minutes.

## 2015-01-26 NOTE — ED Notes (Signed)
No swallow screen needed per EDP.  Pt being discharged

## 2015-01-26 NOTE — Discharge Instructions (Signed)

## 2015-02-07 ENCOUNTER — Other Ambulatory Visit: Payer: Self-pay

## 2015-02-07 VITALS — BP 134/80 | HR 85 | Ht 69.0 in | Wt 328.0 lb

## 2015-02-07 DIAGNOSIS — E119 Type 2 diabetes mellitus without complications: Secondary | ICD-10-CM

## 2015-02-07 NOTE — Patient Outreach (Signed)
[facility]) Care Management   02/07/2015  Helyn NumbersRichard S Baumgart 08/16/1976 [id number]  Helyn NumbersRichard S Ledee is an 39 y.o. male.  Member seen for initial office visit for Link to Wellness program for self management of Type 2 diabetes  Subjective: member states that he is motivated to get healthier and to get free medications.  States he is in the [facility] study and that it has helped him with staying on track with his medications and checking his blood sugars.  States that he plans to start exercising more when he moves to a new apartment next month.  Objective:   Review of Systems  All other systems reviewed and are negative.   Physical Exam  Filed Vitals:   02/07/15 1549  BP: 134/80  Pulse: 85   Filed Weights   02/07/15 1549  Weight: 328 lb (148.78 kg)    Current Medications:   Current Outpatient Prescriptions  Medication Sig Dispense Refill  . albuterol (PROAIR HFA) 108 (90 BASE) MCG/ACT inhaler Inhale 1-2 puffs into the lungs every 3 (three) hours as needed for wheezing or shortness of breath.    Marland Kitchen. aspirin-sod bicarb-citric acid (ALKA-SELTZER) 325 MG TBEF tablet Take 650 mg by mouth every 6 (six) hours as needed.    Marland Kitchen. glucose blood test strip Use as instructed 100 each 12  . ibuprofen (ADVIL,MOTRIN) 800 MG tablet Take 800 mg by mouth every 8 (eight) hours as needed for headache (pain).     . insulin NPH-regular Human (NOVOLIN 70/30) (70-30) 100 UNIT/ML injection Inject 30 Units into the skin 2 (two) times daily with a meal. (Patient taking differently: Inject 3-40 Units into the skin 2 (two) times daily as needed (low blood glucose). Units depend on blood glucose level) 10 mL 11  . Insulin Syringes, Disposable, U-100 0.5 ML MISC 200 200 each 10  . Lancets (FREESTYLE) lancets Use as instructed 100 each 12  . lisinopril (PRINIVIL,ZESTRIL) 2.5 MG tablet Take 1 tablet (2.5 mg total) by mouth daily. 30 tablet 11  . metFORMIN (GLUCOPHAGE) 500 MG tablet Take 500 mg by mouth  2 (two) times daily with a meal.    . beclomethasone (QVAR) 40 MCG/ACT inhaler Inhale 1 puff into the lungs 2 (two) times daily. (Patient not taking: Reported on 01/26/2015) 1 Inhaler 3  . magnesium 30 MG tablet Take 30 mg by mouth 2 (two) times daily.    . metFORMIN (GLUCOPHAGE) 1000 MG tablet Take 1 tablet (1,000 mg total) by mouth 2 (two) times daily with a meal. (Patient not taking: Reported on 01/26/2015) 60 tablet 5   No current facility-administered medications for this visit.    Functional Status:   In your present state of health, do you have any difficulty performing the following activities: 02/07/2015 07/22/2014  Hearing? - N  Vision? - N  Difficulty concentrating or making decisions? - N  Walking or climbing stairs? - N  Dressing or bathing? - N  Doing errands, shopping? - N  Quarry managerreparing Food and eating ? N -  Using the Toilet? N -  In the past six months, have you accidently leaked urine? N -  Do you have problems with loss of bowel control? N -  Managing your Medications? N -  Managing your Finances? N -  Housekeeping or managing your Housekeeping? N -    Fall/Depression Screening:    PHQ 2/9 Scores 02/07/2015 01/09/2015 01/04/2015  PHQ - 2 Score 0 0 0   [facility] CM Care Plan Problem One  Patient Outreach from 02/07/2015 in [facility]   Care Plan Problem One  Elevated blood sugars as evidenced by hemoglobin A1C of 10.9 related to dx of Type 2 DM   Care Plan for Problem One  Active   [facility] Long Term Goal (31-90 days)  Member will demonstrate lower blood sugars as evidenced by decrease of hemoglobin A1C to 8 in the next 90 days   [facility] Long Term Goal Start Date  02/07/15   Interventions for Problem One Long Term Goal  Instructed on CHO counting and portion sizes, Instructed to be aware of the portion size of rice he eats with his meals, Instructed on s/s of hypoglycemia and actions to take, Instructed to get glucotabs  to take instead of candy or  taking  10 packs of  sugar,  Instructed to  always eat  when he takes his insulin, Instructed on  use of metformin and to not skip taking, Instructed on importance of regular exercise for glycemic control   [facility] CM Short Term Goal #1 (0-30 days)  Member will complete EMMI programs by 03/11/15 as evidenced by completion record in EMMI   [facility] CM [facility] Goal #1 Start Date  02/07/15   Interventions for Short Term Goal #1  Instructed on how to access EMMI programs      Assessment:  Member seen for initial office visit for Link to Wellness program for self management of Type 2 diabetes. Member had not been controlling his diabetes with last hemoglobin A1C of 10.9.   Member needs reinforcement on how to treat hypoglycemia, medication usage and portion control.  Member has been participating in the [facility] program which is helping him stay motivated to control his diabetes  Plan:  Plan to attend [facility].  They will call you or call [phone] Plan to eat 45-60 GM (3-4) servings of carbohydrate a meal and 15 GM for snacks. Plan to check blood sugar 3-4 times either fasting or 1 -2hrs after a meal.  Goals of 80-130 fasting and 180 or less after eating. Plan to walk on treadmill for 60-90 minutes 5 days a week Plan to complete EMMI programs by 03/11/15    Plan to see Link to Wellness on 04/07/15 at 11AM

## 2015-02-08 ENCOUNTER — Telehealth: Payer: Self-pay | Admitting: Family Medicine

## 2015-02-08 ENCOUNTER — Ambulatory Visit: Payer: Managed Care, Other (non HMO) | Attending: Family Medicine | Admitting: *Deleted

## 2015-02-08 VITALS — BP 128/78 | HR 80 | Temp 98.5°F | Resp 20 | Wt 328.2 lb

## 2015-02-08 DIAGNOSIS — E119 Type 2 diabetes mellitus without complications: Secondary | ICD-10-CM

## 2015-02-08 DIAGNOSIS — Z87891 Personal history of nicotine dependence: Secondary | ICD-10-CM | POA: Diagnosis not present

## 2015-02-08 DIAGNOSIS — I10 Essential (primary) hypertension: Secondary | ICD-10-CM | POA: Diagnosis not present

## 2015-02-08 DIAGNOSIS — Z794 Long term (current) use of insulin: Secondary | ICD-10-CM | POA: Diagnosis not present

## 2015-02-08 MED ORDER — INSULIN SYRINGES (DISPOSABLE) U-100 0.5 ML MISC: Status: DC

## 2015-02-08 MED ORDER — ALBUTEROL SULFATE HFA 108 (90 BASE) MCG/ACT IN AERS
1.0000 | INHALATION_SPRAY | RESPIRATORY_TRACT | Status: DC | PRN
Start: 2015-02-08 — End: 2015-12-01

## 2015-02-08 MED ORDER — INSULIN NPH ISOPHANE & REGULAR (70-30) 100 UNIT/ML ~~LOC~~ SUSP
3.0000 [IU] | Freq: Two times a day (BID) | SUBCUTANEOUS | Status: DC
Start: 2015-02-08 — End: 2015-03-09

## 2015-02-08 MED ORDER — METFORMIN HCL 500 MG PO TABS: 500.0000 mg | ORAL_TABLET | Freq: Two times a day (BID) | ORAL | Status: DC

## 2015-02-08 NOTE — Patient Instructions (Signed)
Given Link to Wellness diabetes education packet  1. Plan to attend required class at [facility].  They will call you or call [phone] 2. Plan to eat 45-60 GM (3-4) servings of carbohydrate a meal and 15 GM for snacks. 3. Plan to check blood sugar 3-4 times either fasting or 1 -2hrs after a meal.  Goals of 80-130 fasting and 180 or less after eating. 4. Plan to walk on treadmill for 60-90 minutes 5 days a week 5. Plan to complete EMMI programs by 03/11/15 6. Plan to see Link to Wellness on 04/07/15 at 11AM

## 2015-02-08 NOTE — Progress Notes (Signed)
Patient presents for BP check, CBG and record review for T2DM after starting metformin 1000 mg bid and lisinopril 2.5 mg daily Med list reviewed; patient reports taking 3 units of 70/30 insulin unless BS > 200 then takes 4 units Taking metformin 500 mg bid instead of 1000 mg as directed Has not started QVAR due to cost , however, patient has decreased asthma sx since he is not currently affected by seasonal pollen allergies Patient's AM fasting blood sugars ranging 140-192 (1 outlier of 241) Patient's before lunch blood sugars ranging 85-120 Patient's before dinner blood sugars ranging 115-183  Patient states he is eating smaller portions Walking 2.5 miles 4 days per week. Will be beginning exercising at gym for Trial Patient in Live Free Program and Well [facility] [facility] Denies increased thirst and urination  Patient states he injected 4 units 70/30 insulin one time without knowing BS. He then checked BS and it was 60. He ate bread and drank water with 10 sugar packs and BS increased to 180 then dropped to 90 within 1 hour. Patient instructed on recommendations for increasing BS rapidly ie. 1 cup milk, 1/2 cup fruit juice, 1/2 cup regular soda, 3-4 glucose tabs, or 5-6 hard candies and follow with protein item or meal. Discussed importance of always checking BS before insulin administration Patient reports not eating breakfast after injecting insulin Educated on importance of eating within 5 minutes after injecting insulin to prevent hypoglycemia S/sx of hypoglycemia and immediate actions to take discussed in detail  Patient is not adding salt to foods or cooking with salt. Patient made aware of Mrs Sharilyn SitesDash as alternative to salt. Encouraged patient to choose foods with 5% or less of daily value for sodium.   CBG 153 AM fasting per patient record. Patient ate one hour ago so BS not repeated  Lab Results  Component Value Date   HGBA1C 10.90 01/04/2015   Filed Vitals:   02/08/15 1656   BP: 128/78  Pulse: 80  Temp: 98.5 F (36.9 C)  Resp: 20   WT 328.4 lb  Patient requesting refills on albuterol, insulin, syringes and metformin [facility]  Patient aware that he is to f/u with PCP 3 months from last visit. Due 04/06/2015  Patient given literature on DASH Eating Plan,  Diabetes and Food, Diabetes and Exercise, Basic Carb Counting, Diabetes and Foot Care, and Hypoglycemia  Message routed to PCP for potential changes

## 2015-02-08 NOTE — Patient Instructions (Signed)
Diabetes and Foot Care Diabetes may cause you to have problems because of poor blood supply (circulation) to your feet and legs. This may cause the skin on your feet to become thinner, break easier, and heal more slowly. Your skin may become dry, and the skin may peel and crack. You may also have nerve damage in your legs and feet causing decreased feeling in them. You may not notice minor injuries to your feet that could lead to infections or more serious problems. Taking care of your feet is one of the most important things you can do for yourself.  HOME CARE INSTRUCTIONS  Wear shoes at all times, even in the house. Do not go barefoot. Bare feet are easily injured.  Check your feet daily for blisters, cuts, and redness. If you cannot see the bottom of your feet, use a mirror or ask someone for help.  Wash your feet with warm water (do not use hot water) and mild soap. Then pat your feet and the areas between your toes until they are completely dry. Do not soak your feet as this can dry your skin.  Apply a moisturizing lotion or petroleum jelly (that does not contain alcohol and is unscented) to the skin on your feet and to dry, brittle toenails. Do not apply lotion between your toes.  Trim your toenails straight across. Do not dig under them or around the cuticle. File the edges of your nails with an emery board or nail file.  Do not cut corns or calluses or try to remove them with medicine.  Wear clean socks or stockings every day. Make sure they are not too tight. Do not wear knee-high stockings since they may decrease blood flow to your legs.  Wear shoes that fit properly and have enough cushioning. To break in new shoes, wear them for just a few hours a day. This prevents you from injuring your feet. Always look in your shoes before you put them on to be sure there are no objects inside.  Do not cross your legs. This may decrease the blood flow to your feet.  If you find a minor scrape,  cut, or break in the skin on your feet, keep it and the skin around it clean and dry. These areas may be cleansed with mild soap and water. Do not cleanse the area with peroxide, alcohol, or iodine.  When you remove an adhesive bandage, be sure not to damage the skin around it.  If you have a wound, look at it several times a day to make sure it is healing.  Do not use heating pads or hot water bottles. They may burn your skin. If you have lost feeling in your feet or legs, you may not know it is happening until it is too late.  Make sure your health care provider performs a complete foot exam at least annually or more often if you have foot problems. Report any cuts, sores, or bruises to your health care provider immediately. SEEK MEDICAL CARE IF:   You have an injury that is not healing.  You have cuts or breaks in the skin.  You have an ingrown nail.  You notice redness on your legs or feet.  You feel burning or tingling in your legs or feet.  You have pain or cramps in your legs and feet.  Your legs or feet are numb.  Your feet always feel cold. SEEK IMMEDIATE MEDICAL CARE IF:   There is increasing redness,   swelling, or pain in or around a wound.  There is a red line that goes up your leg.  Pus is coming from a wound.  You develop a fever or as directed by your health care provider.  You notice a bad smell coming from an ulcer or wound. Document Released: 09/06/2000 Document Revised: 05/12/2013 Document Reviewed: 02/16/2013 [facility] Patient Information 2015 [place], [place]. This information is not intended to replace advice given to you by your health care provider. Make sure you discuss any questions you have with your health care provider. Diabetes and Exercise Exercising regularly is important. It is not just about losing weight. It has many health benefits, such as:  Improving your overall fitness, flexibility, and endurance.  Increasing your bone  density.  Helping with weight control.  Decreasing your body fat.  Increasing your muscle strength.  Reducing stress and tension.  Improving your overall health. People with diabetes who exercise gain additional benefits because exercise:  Reduces appetite.  Improves the body's use of blood sugar (glucose).  Helps lower or control blood glucose.  Decreases blood pressure.  Helps control blood lipids (such as cholesterol and triglycerides).  Improves the body's use of the hormone insulin by:  Increasing the body's insulin sensitivity.  Reducing the body's insulin needs.  Decreases the risk for heart disease because exercising:  Lowers cholesterol and triglycerides levels.  Increases the levels of good cholesterol (such as high-density lipoproteins [HDL]) in the body.  Lowers blood glucose levels. YOUR ACTIVITY PLAN  Choose an activity that you enjoy and set realistic goals. Your health care provider or diabetes educator can help you make an activity plan that works for you. Exercise regularly as directed by your health care provider. This includes:  Performing resistance training twice a week such as push-ups, sit-ups, lifting weights, or using resistance bands.  Performing 150 minutes of cardio exercises each week such as walking, running, or playing sports.  Staying active and spending no more than 90 minutes at one time being inactive. Even short bursts of exercise are good for you. Three 10-minute sessions spread throughout the day are just as beneficial as a single 30-minute session. Some exercise ideas include:  Taking the dog for a walk.  Taking the stairs instead of the elevator.  Dancing to your favorite song.  Doing an exercise video.  Doing your favorite exercise with a friend. RECOMMENDATIONS FOR EXERCISING WITH TYPE 1 OR TYPE 2 DIABETES   Check your blood glucose before exercising. If blood glucose levels are greater than 240 mg/dL, check for urine  ketones. Do not exercise if ketones are present.  Avoid injecting insulin into areas of the body that are going to be exercised. For example, avoid injecting insulin into:  The arms when playing tennis.  The legs when jogging.  Keep a record of:  Food intake before and after you exercise.  Expected peak times of insulin action.  Blood glucose levels before and after you exercise.  The type and amount of exercise you have done.  Review your records with your health care provider. Your health care provider will help you to develop guidelines for adjusting food intake and insulin amounts before and after exercising.  If you take insulin or oral hypoglycemic agents, watch for signs and symptoms of hypoglycemia. They include:  Dizziness.  Shaking.  Sweating.  Chills.  Confusion.  Drink plenty of water while you exercise to prevent dehydration or heat stroke. Body water is lost during exercise and must be  replaced.  Talk to your health care provider before starting an exercise program to make sure it is safe for you. Remember, almost any type of activity is better than none. Document Released: 11/30/2003 Document Revised: 01/24/2014 Document Reviewed: 02/16/2013 [facility] Patient Information 2015 [place], [place]. This information is not intended to replace advice given to you by your health care provider. Make sure you discuss any questions you have with your health care provider. Diabetes Mellitus and Food It is important for you to manage your blood sugar (glucose) level. Your blood glucose level can be greatly affected by what you eat. Eating healthier foods in the appropriate amounts throughout the day at about the same time each day will help you control your blood glucose level. It can also help slow or prevent worsening of your diabetes mellitus. Healthy eating may even help you improve the level of your blood pressure and reach or maintain a healthy weight.  HOW CAN FOOD  AFFECT ME? Carbohydrates Carbohydrates affect your blood glucose level more than any other type of food. Your dietitian will help you determine how many carbohydrates to eat at each meal and teach you how to count carbohydrates. Counting carbohydrates is important to keep your blood glucose at a healthy level, especially if you are using insulin or taking certain medicines for diabetes mellitus. Alcohol Alcohol can cause sudden decreases in blood glucose (hypoglycemia), especially if you use insulin or take certain medicines for diabetes mellitus. Hypoglycemia can be a life-threatening condition. Symptoms of hypoglycemia (sleepiness, dizziness, and disorientation) are similar to symptoms of having too much alcohol.  If your health care provider has given you approval to drink alcohol, do so in moderation and use the following guidelines:  Women should not have more than one drink per day, and men should not have more than two drinks per day. One drink is equal to:  12 oz of beer.  5 oz of wine.  1 oz of hard liquor.  Do not drink on an empty stomach.  Keep yourself hydrated. Have water, diet soda, or unsweetened iced tea.  Regular soda, juice, and other mixers might contain a lot of carbohydrates and should be counted. WHAT FOODS ARE NOT RECOMMENDED? As you make food choices, it is important to remember that all foods are not the same. Some foods have fewer nutrients per serving than other foods, even though they might have the same number of calories or carbohydrates. It is difficult to get your body what it needs when you eat foods with fewer nutrients. Examples of foods that you should avoid that are high in calories and carbohydrates but low in nutrients include:  Trans fats (most processed foods list trans fats on the Nutrition Facts label).  Regular soda.  Juice.  Candy.  Sweets, such as cake, pie, doughnuts, and cookies.  Fried foods. WHAT FOODS CAN I EAT? Have  nutrient-rich foods, which will nourish your body and keep you healthy. The food you should eat also will depend on several factors, including:  The calories you need.  The medicines you take.  Your weight.  Your blood glucose level.  Your blood pressure level.  Your cholesterol level. You also should eat a variety of foods, including:  Protein, such as meat, poultry, fish, tofu, nuts, and seeds (lean animal proteins are best).  Fruits.  Vegetables.  Dairy products, such as milk, cheese, and yogurt (low fat is best).  Breads, grains, pasta, cereal, rice, and beans.  Fats such as olive oil,  trans fat-free margarine, canola oil, avocado, and olives. DOES EVERYONE WITH DIABETES MELLITUS HAVE THE SAME MEAL PLAN? Because every person with diabetes mellitus is different, there is not one meal plan that works for everyone. It is very important that you meet with a dietitian who will help you create a meal plan that is just right for you. Document Released: 06/06/2005 Document Revised: 09/14/2013 Document Reviewed: 08/06/2013 [facility] Patient Information 2015 [place], [place]. This information is not intended to replace advice given to you by your health care provider. Make sure you discuss any questions you have with your health care provider. Basic Carbohydrate Counting for Diabetes Mellitus Carbohydrate counting is a method for keeping track of the amount of carbohydrates you eat. Eating carbohydrates naturally increases the level of sugar (glucose) in your blood, so it is important for you to know the amount that is okay for you to have in every meal. Carbohydrate counting helps keep the level of glucose in your blood within normal limits. The amount of carbohydrates allowed is different for every person. A dietitian can help you calculate the amount that is right for you. Once you know the amount of carbohydrates you can have, you can count the carbohydrates in the foods you want to  eat. Carbohydrates are found in the following foods:  Grains, such as breads and cereals.  Dried beans and soy products.  Starchy vegetables, such as potatoes, peas, and corn.  Fruit and fruit juices.  Milk and yogurt.  Sweets and snack foods, such as cake, cookies, candy, chips, soft drinks, and fruit drinks. CARBOHYDRATE COUNTING There are two ways to count the carbohydrates in your food. You can use either of the methods or a combination of both. Reading the "Nutrition Facts" on [place] The "Nutrition Facts" is an area that is included on the labels of almost all packaged food and beverages in the [place]. It includes the serving size of that food or beverage and information about the nutrients in each serving of the food, including the grams (g) of carbohydrate per serving.  Decide the number of servings of this food or beverage that you will be able to eat or drink. Multiply that number of servings by the number of grams of carbohydrate that is listed on the label for that serving. The total will be the amount of carbohydrates you will be having when you eat or drink this food or beverage. Learning Standard Serving Sizes of Food When you eat food that is not packaged or does not include "Nutrition Facts" on the label, you need to measure the servings in order to count the amount of carbohydrates.A serving of most carbohydrate-rich foods contains about 15 g of carbohydrates. The following list includes serving sizes of carbohydrate-rich foods that provide 15 g ofcarbohydrate per serving:   1 slice of bread (1 oz) or 1 six-inch tortilla.    of a hamburger bun or English muffin.  4-6 crackers.   cup unsweetened dry cereal.    cup hot cereal.   cup rice or pasta.    cup mashed potatoes or  of a large baked potato.  1 cup fresh fruit or one small piece of fruit.    cup canned or frozen fruit or fruit juice.  1 cup milk.   cup plain fat-free yogurt or  yogurt sweetened with artificial sweeteners.   cup cooked dried beans or starchy vegetable, such as peas, corn, or potatoes.  Decide the number of standard-size servings that you  will eat. Multiply that number of servings by 15 (the grams of carbohydrates in that serving). For example, if you eat 2 cups of strawberries, you will have eaten 2 servings and 30 g of carbohydrates (2 servings x 15 g = 30 g). For foods such as soups and casseroles, in which more than one food is mixed in, you will need to count the carbohydrates in each food that is included. EXAMPLE OF CARBOHYDRATE COUNTING Sample Dinner  3 oz chicken breast.   cup of brown rice.   cup of corn.  1 cup milk.   1 cup strawberries with sugar-free whipped topping.  Carbohydrate Calculation Step 1: Identify the foods that contain carbohydrates:   Rice.   Corn.   Milk.   Strawberries. Step 2:Calculate the number of servings eaten of each:   2 servings of rice.   1 serving of corn.   1 serving of milk.   1 serving of strawberries. Step 3: Multiply each of those number of servings by 15 g:   2 servings of rice x 15 g = 30 g.   1 serving of corn x 15 g = 15 g.   1 serving of milk x 15 g = 15 g.   1 serving of strawberries x 15 g = 15 g. Step 4: Add together all of the amounts to find the total grams of carbohydrates eaten: 30 g + 15 g + 15 g + 15 g = 75 g. Document Released: 09/09/2005 Document Revised: 01/24/2014 Document Reviewed: 08/06/2013 [facility] Patient Information 2015 [place], [place]. This information is not intended to replace advice given to you by your health care provider. Make sure you discuss any questions you have with your health care provider. Hypoglycemia Hypoglycemia occurs when the glucose in your blood is too low. Glucose is a type of sugar that is your body's main energy source. Hormones, such as insulin and glucagon, control the level of glucose in the blood. Insulin lowers  blood glucose and glucagon increases blood glucose. Having too much insulin in your blood stream, or not eating enough food containing sugar, can result in hypoglycemia. Hypoglycemia can happen to people with or without diabetes. It can develop quickly and can be a medical emergency.  CAUSES   Missing or delaying meals.  Not eating enough carbohydrates at meals.  Taking too much diabetes medicine.  Not timing your oral diabetes medicine or insulin doses with meals, snacks, and exercise.  Nausea and vomiting.  Certain medicines.  Severe illnesses, such as hepatitis, kidney disorders, and certain eating disorders.  Increased activity or exercise without eating something extra or adjusting medicines.  Drinking too much alcohol.  A nerve disorder that affects body functions like your heart rate, blood pressure, and digestion (autonomic neuropathy).  A condition where the stomach muscles do not function properly (gastroparesis). Therefore, medicines and food may not absorb properly.  Rarely, a tumor of the pancreas can produce too much insulin. SYMPTOMS   Hunger.  Sweating (diaphoresis).  Change in body temperature.  Shakiness.  Headache.  Anxiety.  Lightheadedness.  Irritability.  Difficulty concentrating.  Dry mouth.  Tingling or numbness in the hands or feet.  Restless sleep or sleep disturbances.  Altered speech and coordination.  Change in mental status.  Seizures or prolonged convulsions.  Combativeness.  Drowsiness (lethargic).  Weakness.  Increased heart rate or palpitations.  Confusion.  Pale, gray skin color.  Blurred or double vision.  Fainting. DIAGNOSIS  A physical exam and medical history  will be performed. Your caregiver may make a diagnosis based on your symptoms. Blood tests and other lab tests may be performed to confirm a diagnosis. Once the diagnosis is made, your caregiver will see if your signs and symptoms go away once your  blood glucose is raised.  TREATMENT  Usually, you can easily treat your hypoglycemia when you notice symptoms.  Check your blood glucose. If it is less than 70 mg/dl, take one of the following:   3-4 glucose tablets.    cup juice.    cup regular soda.   1 cup skim milk.   -1 tube of glucose gel.   5-6 hard candies.   Avoid high-fat drinks or food that may delay a rise in blood glucose levels.  Do not take more than the recommended amount of sugary foods, drinks, gel, or tablets. Doing so will cause your blood glucose to go too high.   Wait 10-15 minutes and recheck your blood glucose. If it is still less than 70 mg/dl or below your target range, repeat treatment.   Eat a snack if it is more than 1 hour until your next meal.  There may be a time when your blood glucose may go so low that you are unable to treat yourself at home when you start to notice symptoms. You may need someone to help you. You may even faint or be unable to swallow. If you cannot treat yourself, someone will need to bring you to the hospital.  HOME CARE INSTRUCTIONS  If you have diabetes, follow your diabetes management plan by:  Taking your medicines as directed.  Following your exercise plan.  Following your meal plan. Do not skip meals. Eat on time.  Testing your blood glucose regularly. Check your blood glucose before and after exercise. If you exercise longer or different than usual, be sure to check blood glucose more frequently.  Wearing your medical alert jewelry that says you have diabetes.  Identify the cause of your hypoglycemia. Then, develop ways to prevent the recurrence of hypoglycemia.  Do not take a hot bath or shower right after an insulin shot.  Always carry treatment with you. Glucose tablets are the easiest to carry.  If you are going to drink alcohol, drink it only with meals.  Tell friends or family members ways to keep you safe during a seizure. This may  include removing hard or sharp objects from the area or turning you on your side.  Maintain a healthy weight. SEEK MEDICAL CARE IF:   You are having problems keeping your blood glucose in your target range.  You are having frequent episodes of hypoglycemia.  You feel you might be having side effects from your medicines.  You are not sure why your blood glucose is dropping so low.  You notice a change in vision or a new problem with your vision. SEEK IMMEDIATE MEDICAL CARE IF:   Confusion develops.  A change in mental status occurs.  The inability to swallow develops.  Fainting occurs. Document Released: 09/09/2005 Document Revised: 09/14/2013 Document Reviewed: 01/06/2012 [facility] Patient Information 2015 [place], [place]. This information is not intended to replace advice given to you by your health care provider. Make sure you discuss any questions you have with your health care provider. DASH Eating Plan DASH stands for "Dietary Approaches to Stop Hypertension." The DASH eating plan is a healthy eating plan that has been shown to reduce high blood pressure (hypertension). Additional health benefits may include reducing the  risk of type 2 diabetes mellitus, heart disease, and stroke. The DASH eating plan may also help with weight loss. WHAT DO I NEED TO KNOW ABOUT THE DASH EATING PLAN? For the DASH eating plan, you will follow these general guidelines:  Choose foods with a percent daily value for sodium of less than 5% (as listed on the food label).  Use salt-free seasonings or herbs instead of table salt or sea salt.  Check with your health care provider or pharmacist before using salt substitutes.  Eat lower-sodium products, often labeled as "lower sodium" or "no salt added."  Eat fresh foods.  Eat more vegetables, fruits, and low-fat dairy products.  Choose whole grains. Look for the word "whole" as the first word in the ingredient list.  Choose fish and skinless  chicken or [place] more often than red meat. Limit fish, poultry, and meat to 6 oz (170 g) each day.  Limit sweets, desserts, sugars, and sugary drinks.  Choose heart-healthy fats.  Limit cheese to 1 oz (28 g) per day.  Eat more home-cooked food and less restaurant, buffet, and fast food.  Limit fried foods.  Cook foods using methods other than frying.  Limit canned vegetables. If you do use them, rinse them well to decrease the sodium.  When eating at a restaurant, ask that your food be prepared with less salt, or no salt if possible. WHAT FOODS CAN I EAT? Seek help from a dietitian for individual calorie needs. Grains Whole grain or whole wheat bread. Brown rice. Whole grain or whole wheat pasta. Quinoa, bulgur, and whole grain cereals. Low-sodium cereals. Corn or whole wheat flour tortillas. Whole grain cornbread. Whole grain crackers. Low-sodium crackers. Vegetables Fresh or frozen vegetables (raw, steamed, roasted, or grilled). Low-sodium or reduced-sodium tomato and vegetable juices. Low-sodium or reduced-sodium tomato sauce and paste. Low-sodium or reduced-sodium canned vegetables.  Fruits All fresh, canned (in natural juice), or frozen fruits. Meat and Other Protein Products Ground beef (85% or leaner), grass-fed beef, or beef trimmed of fat. Skinless chicken or [place]. Ground chicken or [place]. Pork trimmed of fat. All fish and seafood. Eggs. Dried beans, peas, or lentils. Unsalted nuts and seeds. Unsalted canned beans. Dairy Low-fat dairy products, such as skim or 1% milk, 2% or reduced-fat cheeses, low-fat ricotta or cottage cheese, or plain low-fat yogurt. Low-sodium or reduced-sodium cheeses. Fats and Oils Tub margarines without trans fats. Light or reduced-fat mayonnaise and salad dressings (reduced sodium). Avocado. Safflower, olive, or canola oils. Natural peanut or almond butter. Other Unsalted popcorn and pretzels. The items listed above may not be a complete list of  recommended foods or beverages. Contact your dietitian for more options. WHAT FOODS ARE NOT RECOMMENDED? Grains White bread. White pasta. White rice. Refined cornbread. Bagels and croissants. Crackers that contain trans fat. Vegetables Creamed or fried vegetables. Vegetables in a cheese sauce. Regular canned vegetables. Regular canned tomato sauce and paste. Regular tomato and vegetable juices. Fruits Dried fruits. Canned fruit in light or heavy syrup. Fruit juice. Meat and Other Protein Products Fatty cuts of meat. Ribs, chicken wings, bacon, sausage, bologna, salami, chitterlings, fatback, hot dogs, bratwurst, and packaged luncheon meats. Salted nuts and seeds. Canned beans with salt. Dairy Whole or 2% milk, cream, half-and-half, and cream cheese. Whole-fat or sweetened yogurt. Full-fat cheeses or blue cheese. Nondairy creamers and whipped toppings. Processed cheese, cheese spreads, or cheese curds. Condiments Onion and garlic salt, seasoned salt, table salt, and sea salt. Canned and packaged gravies. Worcestershire sauce. Tartar sauce.  Barbecue sauce. Teriyaki sauce. Soy sauce, including reduced sodium. Steak sauce. Fish sauce. Oyster sauce. Cocktail sauce. Horseradish. Ketchup and mustard. Meat flavorings and tenderizers. Bouillon cubes. Hot sauce. Tabasco sauce. Marinades. Taco seasonings. Relishes. Fats and Oils Butter, stick margarine, lard, shortening, ghee, and bacon fat. Coconut, palm kernel, or palm oils. Regular salad dressings. Other Pickles and olives. Salted popcorn and pretzels. The items listed above may not be a complete list of foods and beverages to avoid. Contact your dietitian for more information. WHERE CAN I FIND MORE INFORMATION? National Heart, Lung, and Blood Institute: CablePromo.itwww.nhlbi.nih.gov/health/health-topics/topics/dash/ Document Released: 08/29/2011 Document Revised: 01/24/2014 Document Reviewed: 07/14/2013 [facility] Patient Information 2015 [place], [place]. This  information is not intended to replace advice given to you by your health care provider. Make sure you discuss any questions you have with your health care provider.

## 2015-02-08 NOTE — Telephone Encounter (Signed)
Patient states that if called the best time to call would be after 4 pm.

## 2015-02-09 ENCOUNTER — Ambulatory Visit: Payer: Managed Care, Other (non HMO) | Admitting: Dietician

## 2015-02-25 ENCOUNTER — Encounter: Payer: 59 | Attending: Internal Medicine

## 2015-02-25 DIAGNOSIS — E118 Type 2 diabetes mellitus with unspecified complications: Secondary | ICD-10-CM | POA: Insufficient documentation

## 2015-02-25 DIAGNOSIS — Z794 Long term (current) use of insulin: Secondary | ICD-10-CM | POA: Insufficient documentation

## 2015-02-25 DIAGNOSIS — Z713 Dietary counseling and surveillance: Secondary | ICD-10-CM | POA: Insufficient documentation

## 2015-03-09 ENCOUNTER — Ambulatory Visit: Payer: 59 | Attending: Family Medicine | Admitting: Family Medicine

## 2015-03-09 ENCOUNTER — Encounter: Payer: Self-pay | Admitting: Family Medicine

## 2015-03-09 VITALS — BP 132/84 | HR 78 | Temp 98.9°F | Resp 18 | Ht 69.0 in | Wt 325.0 lb

## 2015-03-09 DIAGNOSIS — E119 Type 2 diabetes mellitus without complications: Secondary | ICD-10-CM | POA: Diagnosis not present

## 2015-03-09 DIAGNOSIS — Z794 Long term (current) use of insulin: Secondary | ICD-10-CM | POA: Diagnosis not present

## 2015-03-09 DIAGNOSIS — Z6841 Body Mass Index (BMI) 40.0 and over, adult: Secondary | ICD-10-CM | POA: Insufficient documentation

## 2015-03-09 LAB — GLUCOSE, POCT (MANUAL RESULT ENTRY): POC Glucose: 137 mg/dl — AB (ref 70–99)

## 2015-03-09 MED ORDER — METFORMIN HCL 500 MG PO TABS: 1000.0000 mg | ORAL_TABLET | Freq: Two times a day (BID) | ORAL | Status: DC

## 2015-03-09 MED ORDER — INSULIN NPH ISOPHANE & REGULAR (70-30) 100 UNIT/ML ~~LOC~~ SUSP: 2.0000 [IU] | Freq: Two times a day (BID) | SUBCUTANEOUS | Status: DC

## 2015-03-09 NOTE — Progress Notes (Signed)
F/U DM Change medication dose. 2 units BID Stated glucose low with 4 unit BID

## 2015-03-09 NOTE — Assessment & Plan Note (Signed)
Diabetes type 2: Improved sugars  Goal A1c is < 7 Diabetes blood sugar goals  Fasting (in AM before breakfast, 8 hrs of no eating or drinking (except water or unsweetened coffee or tea): 90-110 2 hrs after meals: < 160,   No low sugars: nothing < 70   Decrease novolin 70/30 to 2 U twice daily Increase metformin to 1000 mg twice daily   F/u in 1 month with nurse for CBG and A1c check  F/u with me in 3 months

## 2015-03-09 NOTE — Progress Notes (Signed)
   Subjective:    Patient ID: Michael Foster, male    DOB: 1976-02-15, 39 y.o.   MRN: [id number] CC: DM2 f/u  HPI  1 CHRONIC DIABETES  Disease Monitoring  Blood Sugar Ranges: 88-307 (mostly in 160s-200), without   Polyuria: no   Visual problems: no   Medication Compliance: yes, 2 U BID  Medication Side Effects  Hypoglycemia: symptomatic at CBG of 100 or less   Preventitive Health Care  Eye Exam: due   Foot Exam: done   Diet pattern: small breakfast, regular lunch and dinner   Exercise: yes   Soc Hx: non smoker  Review of Systems  Constitutional: Negative for fever and chills.  Respiratory: Negative for shortness of breath.   Cardiovascular: Negative for chest pain.       Objective:   Physical Exam BP 132/84 mmHg  Pulse 78  Temp(Src) 98.9 F (37.2 C) (Oral)  Resp 18  Ht 5\' 9"  (1.753 m)  Wt 325 lb (147.419 kg)  BMI 47.97 kg/m2  SpO2 98%  Wt Readings from Last 3 Encounters:  03/09/15 325 lb (147.419 kg)  02/08/15 328 lb 3.2 oz (148.871 kg)  02/07/15 328 lb (148.78 kg)  General appearance: alert, cooperative, no distress and morbidly obese Lungs: clear to auscultation bilaterally Heart: regular rate and rhythm, S1, S2 normal, no murmur, click, rub or gallop  Lab Results  Component Value Date   HGBA1C 10.90 01/04/2015   CBG 137      Assessment & Plan:

## 2015-03-09 NOTE — Patient Instructions (Signed)
Mr. Bhatia,  Thank you for coming in today  1. Diabetes type 2: Improved sugars  Goal A1c is < 7 Diabetes blood sugar goals  Fasting (in AM before breakfast, 8 hrs of no eating or drinking (except water or unsweetened coffee or tea): 90-110 2 hrs after meals: < 160,   No low sugars: nothing < 70   Decrease novolin 70/30 to 2 U twice daily Increase metformin to 1000 mg twice daily   F/u in 1 month with nurse for CBG and A1c check  F/u with me in 3 months    Dr. Armen Pickup

## 2015-04-07 ENCOUNTER — Ambulatory Visit: Payer: Managed Care, Other (non HMO)

## 2015-04-14 ENCOUNTER — Other Ambulatory Visit: Payer: Self-pay

## 2015-04-14 ENCOUNTER — Ambulatory Visit: Payer: Self-pay

## 2015-04-14 ENCOUNTER — Ambulatory Visit: Payer: 59 | Attending: Family Medicine | Admitting: *Deleted

## 2015-04-14 VITALS — BP 125/82 | HR 82 | Temp 98.7°F | Resp 20 | Ht 69.0 in | Wt 328.3 lb

## 2015-04-14 DIAGNOSIS — Z794 Long term (current) use of insulin: Secondary | ICD-10-CM | POA: Insufficient documentation

## 2015-04-14 DIAGNOSIS — E1165 Type 2 diabetes mellitus with hyperglycemia: Secondary | ICD-10-CM | POA: Diagnosis not present

## 2015-04-14 DIAGNOSIS — E119 Type 2 diabetes mellitus without complications: Secondary | ICD-10-CM

## 2015-04-14 LAB — POCT GLYCOSYLATED HEMOGLOBIN (HGB A1C): HEMOGLOBIN A1C: 9.1

## 2015-04-14 LAB — GLUCOSE, POCT (MANUAL RESULT ENTRY)
POC Glucose: 316 mg/dl — AB (ref 70–99)
POC Glucose: 313 mg/dl — AB (ref 70–99)

## 2015-04-14 MED ORDER — TRUE METRIX AIR GLUCOSE METER W/DEVICE KIT: 6.0000 [IU] | PACK | Freq: Two times a day (BID) | Status: DC

## 2015-04-14 MED ORDER — INSULIN ASPART 100 UNIT/ML ~~LOC~~ SOLN
10.0000 [IU] | Freq: Once | SUBCUTANEOUS | Status: AC
  Administered 2015-04-14: 10 [IU] via SUBCUTANEOUS

## 2015-04-14 MED ORDER — GLUCOSE BLOOD VI STRP: ORAL_STRIP | Status: DC

## 2015-04-14 MED ORDER — INSULIN NPH ISOPHANE & REGULAR (70-30) 100 UNIT/ML ~~LOC~~ SUSP: 6.0000 [IU] | Freq: Two times a day (BID) | SUBCUTANEOUS | Status: DC

## 2015-04-14 MED ORDER — TRUEPLUS LANCETS 30G MISC: Status: DC

## 2015-04-14 NOTE — Progress Notes (Signed)
Patient presents for CBG and record review for T2DM, however, patient did not bring log or meter Med list reviewed; patient reports taking all meds as directed Taking 2 units of 70/30 bid however, did not take insulin for 2-3 days, but took 4 units this AM Did not take metformin last 5 days Separated from wife this week and states his schedule has been off Has not exercised this week Checking BS 1 hour after eating. Instructed to wait at least 2 hours if checking after meal Patient reports AM fasting blood sugars ranging 133-160 Patient reports before dinner blood sugars ranging 120-145 Denies increased thirst  Positive for increased urination when BS > 300 Patient was not rotating injection sites. Discussed importance of rotating sites due to risk of tissue damage and for consistent absorption of insulin  CBG 306 AM fasting per patient  CBG 316 2 + hours after eating meal of " a lot of lasagna and 2 sandwiches" 10 units novolog insulin given SQ per protocol and PCP  Hgb A1C 9.1 today  Lab Results  Component Value Date   HGBA1C 10.90 01/04/2015   Filed Vitals:   04/14/15 1410  BP: 125/82  Pulse: 82  Temp: 98.7 F (37.1 C)  Resp: 20     Per PCP: Make better food choices Increase 70/30 insulin to 6 units bid Reduce to 4 units bid once sugars are better controlled BS Goal AM fasting 90-110 Post prandial < 160  Patient to return in 2-4 weeks for nurse visit for BP check, CBG and record review

## 2015-04-14 NOTE — Patient Outreach (Signed)
[facility]) Care Management  04/14/2015  Michael Foster 10-21-1975 [id number] Called member to reschedule Link to Wellness appt as he has an appt with nurse at the [facility] and Wellness clinic today.  Rescheduled for 05/23/15.   Spoke with [name] Pharm D at the [facility] and Wellness clinic about helping member get the most benefit from his free Link to Wellness medications and she will follow up with member. [name] RN, [facility] Care Management Coordinator-Link to Wellness [facility] Care Management [phone]

## 2015-05-05 ENCOUNTER — Ambulatory Visit: Payer: 59 | Attending: Family Medicine | Admitting: *Deleted

## 2015-05-05 VITALS — BP 132/83 | HR 78 | Temp 98.7°F | Resp 20 | Ht 69.0 in | Wt 325.8 lb

## 2015-05-05 DIAGNOSIS — Z87891 Personal history of nicotine dependence: Secondary | ICD-10-CM | POA: Insufficient documentation

## 2015-05-05 DIAGNOSIS — E1165 Type 2 diabetes mellitus with hyperglycemia: Secondary | ICD-10-CM | POA: Diagnosis present

## 2015-05-05 DIAGNOSIS — Z794 Long term (current) use of insulin: Secondary | ICD-10-CM | POA: Diagnosis not present

## 2015-05-05 NOTE — Progress Notes (Signed)
Patient presents for BP check, CBG and record review for T2DM Taking 2-3 units of 70/30 insulin bid AC instead of as directed and metformin 1000 mg bid (not taking regularly), also not taking lisinopril States will start taking metformin regularly. Discussed starting at 500 mg bid due to past hx of diarrhea when restarted on 1000 mg bid. Will then increase to 1000 mg bid PCP made aware of how patient taking meds Patient's AM fasting blood sugars ranging 108-257 Patient's before dinner blood sugars ranging 109-257 Patient's before bed blood sugars ranging 180-192  Denies increased thirst and urination, blurred vision unless BS near 300   CBG 166 AM fasting per patient  Lab Results  Component Value Date   HGBA1C 9.10 04/14/2015   Filed Vitals:   05/05/15 1629  BP: 132/83  Pulse: 78  Temp: 98.7 F (37.1 C)  Resp: 20     Patient advised to call for med refills at least 7 days before running out so as not to go without.  Patient aware that he is to f/u with PCP 3 months from last visit. Due 06/09/2015  Patient given literature on [employer] for customized diet and lifestyle support

## 2015-05-15 ENCOUNTER — Telehealth: Payer: Self-pay | Admitting: Family Medicine

## 2015-05-15 NOTE — Telephone Encounter (Signed)
Pt. Would like to know if he had unprotected sex this past weekend and would like to know how long should he wait before being tested.Marland KitchenMarland KitchenMarland KitchenMarland Kitchenplease f/u

## 2015-05-16 NOTE — Telephone Encounter (Signed)
Pt here in clinic Was advised to schedule appointment with PCP

## 2015-05-23 ENCOUNTER — Other Ambulatory Visit: Payer: Self-pay

## 2015-05-30 NOTE — Patient Outreach (Signed)
[facility]) Care Management  05/30/2015  JAWAD WIACEK 1976/03/18 [id number]   Member did not show for 05/23/15 appointment.  Appointment request letter sent. [name] RN, [facility] Care Management Coordinator-Link to Wellness [facility] Care Management [phone]

## 2015-06-09 ENCOUNTER — Ambulatory Visit: Payer: 59 | Attending: Family Medicine | Admitting: Family Medicine

## 2015-06-09 ENCOUNTER — Other Ambulatory Visit (HOSPITAL_COMMUNITY)
Admission: RE | Admit: 2015-06-09 | Discharge: 2015-06-09 | Disposition: A | Discharge status: Home / Self Care | Payer: 59 | Source: Ambulatory Visit | Attending: Family Medicine | Admitting: Family Medicine

## 2015-06-09 ENCOUNTER — Encounter: Payer: Self-pay | Admitting: Family Medicine

## 2015-06-09 VITALS — BP 136/85 | HR 78 | Temp 98.8°F | Resp 16 | Ht 69.0 in | Wt 324.0 lb

## 2015-06-09 DIAGNOSIS — Z113 Encounter for screening for infections with a predominantly sexual mode of transmission: Secondary | ICD-10-CM | POA: Diagnosis not present

## 2015-06-09 DIAGNOSIS — Z794 Long term (current) use of insulin: Secondary | ICD-10-CM | POA: Insufficient documentation

## 2015-06-09 DIAGNOSIS — Z87891 Personal history of nicotine dependence: Secondary | ICD-10-CM | POA: Diagnosis not present

## 2015-06-09 DIAGNOSIS — I10 Essential (primary) hypertension: Secondary | ICD-10-CM | POA: Diagnosis not present

## 2015-06-09 DIAGNOSIS — E1165 Type 2 diabetes mellitus with hyperglycemia: Secondary | ICD-10-CM | POA: Insufficient documentation

## 2015-06-09 LAB — GLUCOSE, POCT (MANUAL RESULT ENTRY): POC Glucose: 132 mg/dl — AB (ref 70–99)

## 2015-06-09 NOTE — Progress Notes (Signed)
Patient ID: Michael Foster, male   DOB: 06-30-1976, 39 y.o.   MRN: [id number]   Subjective:  Patient ID: Michael Foster, male    DOB: 1976-07-24  Age: 39 y.o. MRN: [id number]  CC: Follow-up and Diabetes   HPI Michael Foster presents for diabetes  1. CHRONIC DIABETES  Disease Monitoring  Blood Sugar Ranges: 110-170  Polyuria: no   Visual problems: no   Medication Compliance: yes  Medication Side Effects  Hypoglycemia: no    2. Elevated BP: no HA, CP, SOB. Taking lisinopril 2.5 mg for renal protection.   3. Requesting STD screening. No symptoms. Recent new partners.   Outpatient Prescriptions Prior to Visit  Medication Sig Dispense Refill  . albuterol (PROAIR HFA) 108 (90 BASE) MCG/ACT inhaler Inhale 1-2 puffs into the lungs every 3 (three) hours as needed for wheezing or shortness of breath. 18 g 1  . aspirin-sod bicarb-citric acid (ALKA-SELTZER) 325 MG TBEF tablet Take 650 mg by mouth every 6 (six) hours as needed.    . beclomethasone (QVAR) 40 MCG/ACT inhaler Inhale 1 puff into the lungs 2 (two) times daily. 1 Inhaler 3  . Blood Glucose Monitoring Suppl (TRUE METRIX AIR GLUCOSE METER) W/DEVICE KIT 6 Units by Does not apply route 2 (two) times daily before a meal. 1 kit 0  . glucose blood (TRUE METRIX BLOOD GLUCOSE TEST) test strip Use as instructed 100 each 12  . ibuprofen (ADVIL,MOTRIN) 800 MG tablet Take 800 mg by mouth every 8 (eight) hours as needed for headache (pain).     . insulin NPH-regular Human (NOVOLIN 70/30) (70-30) 100 UNIT/ML injection Inject 6 Units into the skin 2 (two) times daily with a meal. 10 mL 3  . Insulin Syringes, Disposable, U-100 0.5 ML MISC 200 200 each 10  . lisinopril (PRINIVIL,ZESTRIL) 2.5 MG tablet Take 1 tablet (2.5 mg total) by mouth daily. 30 tablet 11  . metFORMIN (GLUCOPHAGE) 500 MG tablet Take 2 tablets (1,000 mg total) by mouth 2 (two) times daily with a meal. 120 tablet 5  . TRUEPLUS LANCETS 30G MISC As directed 100 each 5   No  facility-administered medications prior to visit.   Social History  Substance Use Topics  . Smoking status: Former Smoker -- 0.30 packs/day for 10 years    Types: Cigarettes    Quit date: 05/28/2013  . Smokeless tobacco: Never Used  . Alcohol Use: No    ROS Review of Systems  Constitutional: Negative for fever, chills, fatigue and unexpected weight change.  Eyes: Negative for visual disturbance.  Respiratory: Negative for cough and shortness of breath.   Cardiovascular: Negative for chest pain, palpitations and leg swelling.  Gastrointestinal: Negative for nausea, vomiting, abdominal pain, diarrhea, constipation and blood in stool.  Endocrine: Negative for polydipsia, polyphagia and polyuria.  Musculoskeletal: Negative for myalgias, back pain, arthralgias, gait problem and neck pain.  Skin: Negative for rash.  Allergic/Immunologic: Negative for immunocompromised state.  Hematological: Negative for adenopathy. Does not bruise/bleed easily.  Psychiatric/Behavioral: Negative for suicidal ideas, sleep disturbance and dysphoric mood. The patient is not nervous/anxious.     Objective:  BP 136/85 mmHg  Pulse 78  Temp(Src) 98.8 F (37.1 C) (Oral)  Resp 16  Ht '5\' 9"'  (1.753 m)  Wt 324 lb (146.965 kg)  BMI 47.82 kg/m2  SpO2 98%  BP/Weight 06/09/2015 05/05/2015 3/88/8757  Systolic BP 972 820 601  Diastolic BP 85 83 82  Wt. (Lbs) 324 325.8 328.3  BMI 47.82 48.09 48.46  Physical Exam  Constitutional: He appears well-developed and well-nourished. No distress.  Morbidly obese   HENT:  Head: Normocephalic and atraumatic.  Neck: Normal range of motion. Neck supple.  Cardiovascular: Normal rate, regular rhythm, normal heart sounds and intact distal pulses.   Pulmonary/Chest: Effort normal and breath sounds normal.  Musculoskeletal: He exhibits no edema.  Neurological: He is alert.  Skin: Skin is warm and dry. No rash noted. No erythema.  Psychiatric: He has a normal mood and affect.      Assessment & Plan:   Problem List Items Addressed This Visit    Diabetes - Primary (Chronic)   Relevant Orders   POCT glucose (manual entry) (Completed)   Screening for STD (sexually transmitted disease)   Relevant Orders   HIV antibody (with reflex) (Completed)   RPR (Completed)   Urine cytology ancillary only   Acute Hep Panel & Hep B Surface Ab (Completed)      No orders of the defined types were placed in this encounter.    Follow-up: No Follow-up on file.   Boykin Nearing MD

## 2015-06-09 NOTE — Patient Instructions (Signed)
Mr. Audy, Dauphine was seen today for follow-up and diabetes.  Diagnoses and all orders for this visit:  Type 2 diabetes mellitus with hyperglycemia -     POCT glucose (manual entry)  Screening for STD (sexually transmitted disease) -     HIV antibody (with reflex) -     RPR -     Urine cytology ancillary only -     Acute Hep Panel & Hep B Surface Ab   Sugar and BP well controlled Continue current regimen  F/u in 4 week with RN for A1c See me in 3 months  Dr. Armen Pickup

## 2015-06-09 NOTE — Progress Notes (Signed)
F/U DM Glucose running 110-170 Taking medication as prescribed  Requesting STD Check, Multiple sex partner

## 2015-06-10 LAB — ACUTE HEP PANEL AND HEP B SURFACE AB
HCV Ab: NEGATIVE
HEP B S AB: POSITIVE — AB
Hep A IgM: NONREACTIVE
Hep B C IgM: NONREACTIVE
Hepatitis B Surface Ag: NEGATIVE

## 2015-06-10 LAB — RPR

## 2015-06-10 LAB — HIV ANTIBODY (ROUTINE TESTING W REFLEX): HIV: NONREACTIVE

## 2015-06-12 NOTE — Assessment & Plan Note (Signed)
BP at goal. Continue current regimen.  

## 2015-06-13 LAB — URINE CYTOLOGY ANCILLARY ONLY
Chlamydia: NEGATIVE
Neisseria Gonorrhea: NEGATIVE
TRICH (WINDOWPATH): NEGATIVE

## 2015-06-20 ENCOUNTER — Telehealth: Payer: Self-pay | Admitting: Family Medicine

## 2015-06-20 NOTE — Telephone Encounter (Signed)
Patient called to request a med refill for ibuprofen (ADVIL,MOTRIN) 800 MG tablet. Patient also requested his blood work results from last visit. Please f/u with pt.

## 2015-06-22 MED ORDER — IBUPROFEN 800 MG PO TABS: 800.0000 mg | ORAL_TABLET | Freq: Three times a day (TID) | ORAL | Status: DC | PRN

## 2015-06-22 NOTE — Telephone Encounter (Signed)
Ibuprofen refills send to [facility] outpatient pharmacy   LVM to return call     Notes Recorded by Dessa Phi, MD on 06/13/2015 at 3:53 PM Negative STD screen Released to mychart Notes Recorded by Dessa Phi, MD on 06/12/2015 at 1:53 PM Hep B immune  All STI screening negatiIve

## 2015-07-06 ENCOUNTER — Emergency Department (HOSPITAL_COMMUNITY): Payer: 59

## 2015-07-06 ENCOUNTER — Encounter (HOSPITAL_COMMUNITY): Payer: Self-pay | Admitting: Emergency Medicine

## 2015-07-06 ENCOUNTER — Observation Stay (HOSPITAL_COMMUNITY)
Admission: EM | Admit: 2015-07-06 | Discharge: 2015-07-07 | Disposition: A | Discharge status: Home / Self Care | Payer: 59 | Attending: Internal Medicine | Admitting: Internal Medicine

## 2015-07-06 DIAGNOSIS — I10 Essential (primary) hypertension: Secondary | ICD-10-CM | POA: Insufficient documentation

## 2015-07-06 DIAGNOSIS — R0602 Shortness of breath: Secondary | ICD-10-CM

## 2015-07-06 DIAGNOSIS — Z6841 Body Mass Index (BMI) 40.0 and over, adult: Secondary | ICD-10-CM | POA: Insufficient documentation

## 2015-07-06 DIAGNOSIS — E119 Type 2 diabetes mellitus without complications: Secondary | ICD-10-CM | POA: Insufficient documentation

## 2015-07-06 DIAGNOSIS — Z79899 Other long term (current) drug therapy: Secondary | ICD-10-CM | POA: Diagnosis not present

## 2015-07-06 DIAGNOSIS — R0600 Dyspnea, unspecified: Secondary | ICD-10-CM | POA: Diagnosis present

## 2015-07-06 DIAGNOSIS — Z7982 Long term (current) use of aspirin: Secondary | ICD-10-CM | POA: Diagnosis not present

## 2015-07-06 DIAGNOSIS — E785 Hyperlipidemia, unspecified: Secondary | ICD-10-CM | POA: Diagnosis not present

## 2015-07-06 DIAGNOSIS — R079 Chest pain, unspecified: Secondary | ICD-10-CM | POA: Diagnosis not present

## 2015-07-06 DIAGNOSIS — Z794 Long term (current) use of insulin: Secondary | ICD-10-CM | POA: Insufficient documentation

## 2015-07-06 DIAGNOSIS — Z87891 Personal history of nicotine dependence: Secondary | ICD-10-CM | POA: Diagnosis not present

## 2015-07-06 DIAGNOSIS — J45909 Unspecified asthma, uncomplicated: Secondary | ICD-10-CM | POA: Insufficient documentation

## 2015-07-06 DIAGNOSIS — Z8673 Personal history of transient ischemic attack (TIA), and cerebral infarction without residual deficits: Secondary | ICD-10-CM | POA: Insufficient documentation

## 2015-07-06 DIAGNOSIS — R9431 Abnormal electrocardiogram [ECG] [EKG]: Secondary | ICD-10-CM | POA: Diagnosis present

## 2015-07-06 LAB — BASIC METABOLIC PANEL
Anion gap: 8 (ref 5–15)
BUN: 8 mg/dL (ref 6–20)
CHLORIDE: 99 mmol/L — AB (ref 101–111)
CO2: 29 mmol/L (ref 22–32)
Calcium: 9.3 mg/dL (ref 8.9–10.3)
Creatinine, Ser: 0.68 mg/dL (ref 0.61–1.24)
GFR calc non Af Amer: >60 mL/min (ref >60)
Glucose, Bld: 214 mg/dL — ABNORMAL HIGH (ref 65–99)
POTASSIUM: 3.7 mmol/L (ref 3.5–5.1)
SODIUM: 136 mmol/L (ref 135–145)

## 2015-07-06 LAB — CBC
HEMATOCRIT: 46.9 % (ref 39.0–52.0)
Hemoglobin: 15.6 g/dL (ref 13.0–17.0)
MCH: 26.9 pg (ref 26.0–34.0)
MCHC: 33.3 g/dL (ref 30.0–36.0)
MCV: 80.7 fL (ref 78.0–100.0)
Platelets: 379 10*3/uL (ref 150–400)
RBC: 5.81 MIL/uL (ref 4.22–5.81)
RDW: 13.7 % (ref 11.5–15.5)
WBC: 10.5 10*3/uL (ref 4.0–10.5)

## 2015-07-06 LAB — URINALYSIS, ROUTINE W REFLEX MICROSCOPIC
Bilirubin Urine: NEGATIVE
Glucose, UA: NEGATIVE mg/dL
Hgb urine dipstick: NEGATIVE
KETONES UR: NEGATIVE mg/dL
LEUKOCYTES UA: NEGATIVE
NITRITE: NEGATIVE
PROTEIN: NEGATIVE mg/dL
Specific Gravity, Urine: 1.007 (ref 1.005–1.030)
UROBILINOGEN UA: 0.2 mg/dL (ref 0.0–1.0)
pH: 7 (ref 5.0–8.0)

## 2015-07-06 LAB — I-STAT TROPONIN, ED: Troponin i, poc: 0 ng/mL (ref 0.00–0.08)

## 2015-07-06 LAB — D-DIMER, QUANTITATIVE (NOT AT ARMC)

## 2015-07-06 LAB — CBG MONITORING, ED: Glucose-Capillary: 202 mg/dL — ABNORMAL HIGH (ref 65–99)

## 2015-07-06 MED ORDER — LORAZEPAM 0.5 MG PO TABS
0.5000 mg | ORAL_TABLET | Freq: Once | ORAL | Status: AC
  Administered 2015-07-06: 0.5 mg via ORAL
  Filled 2015-07-06: qty 1

## 2015-07-06 MED ORDER — INSULIN ASPART 100 UNIT/ML ~~LOC~~ SOLN
0.0000 [IU] | Freq: Three times a day (TID) | SUBCUTANEOUS | Status: DC
  Administered 2015-07-07: 3 [IU] via SUBCUTANEOUS
  Administered 2015-07-07: 2 [IU] via SUBCUTANEOUS
  Administered 2015-07-07: 3 [IU] via SUBCUTANEOUS

## 2015-07-06 MED ORDER — LISINOPRIL 2.5 MG PO TABS
2.5000 mg | ORAL_TABLET | Freq: Every day | ORAL | Status: DC
  Administered 2015-07-07: 2.5 mg via ORAL
  Filled 2015-07-06: qty 1

## 2015-07-06 MED ORDER — ENOXAPARIN SODIUM 40 MG/0.4ML ~~LOC~~ SOLN
40.0000 mg | Freq: Every day | SUBCUTANEOUS | Status: DC
Start: 2015-07-07 — End: 2015-07-07
  Administered 2015-07-07: 40 mg via SUBCUTANEOUS
  Filled 2015-07-06: qty 0.4

## 2015-07-06 MED ORDER — ACETAMINOPHEN 325 MG PO TABS: 650.0000 mg | ORAL_TABLET | ORAL | Status: DC | PRN

## 2015-07-06 MED ORDER — BUDESONIDE 0.25 MG/2ML IN SUSP
0.2500 mg | Freq: Two times a day (BID) | RESPIRATORY_TRACT | Status: DC
  Administered 2015-07-06: 0.25 mg via RESPIRATORY_TRACT
  Filled 2015-07-06 (×2): qty 2

## 2015-07-06 MED ORDER — ONDANSETRON HCL 4 MG/2ML IJ SOLN: 4.0000 mg | Freq: Four times a day (QID) | INTRAMUSCULAR | Status: DC | PRN

## 2015-07-06 MED ORDER — ALBUTEROL SULFATE HFA 108 (90 BASE) MCG/ACT IN AERS: 1.0000 | INHALATION_SPRAY | RESPIRATORY_TRACT | Status: DC | PRN

## 2015-07-06 MED ORDER — ALBUTEROL SULFATE (2.5 MG/3ML) 0.083% IN NEBU
2.5000 mg | INHALATION_SOLUTION | RESPIRATORY_TRACT | Status: DC | PRN
  Filled 2015-07-06: qty 3

## 2015-07-06 MED ORDER — ONDANSETRON HCL 4 MG/2ML IJ SOLN: 4.0000 mg | Freq: Three times a day (TID) | INTRAMUSCULAR | Status: DC | PRN

## 2015-07-06 MED ORDER — INSULIN ASPART PROT & ASPART (70-30 MIX) 100 UNIT/ML ~~LOC~~ SUSP
2.0000 [IU] | Freq: Two times a day (BID) | SUBCUTANEOUS | Status: DC
  Administered 2015-07-07: 2 [IU] via SUBCUTANEOUS
  Filled 2015-07-06: qty 10

## 2015-07-06 NOTE — H&P (Signed)
History and Physical  MCKADE GURKA JOI:[id number] DOB: 1976-07-01 DOA: 07/06/2015  PCP: [name], MD   Chief Complaint: Dyspnea  History of Present Illness:  Patient is a 39 year old male with history of morbid obesity, DM II, Hypertension, asthma , anxiety who came with cc of dyspnea that started suddenly today after he got back from work. This was associated with diaphoresis and had palpitations. He denied chest pain, cough, fever, chills or wheezing. No history of ischemic heart disease. He smoked for 10+ years for about 1ppd but quit.   Review of Systems:  CONSTITUTIONAL:  No night sweats.  No fatigue, malaise, lethargy.  No fever or chills. Eyes:  No visual changes.  No eye pain.  No eye discharge.   ENT:    No epistaxis.  No sinus pain.  No sore throat.  No ear pain.  No congestion. RESPIRATORY:  No cough.  No wheeze.  No hemoptysis.  +shortness of breath. CARDIOVASCULAR:  No chest pains.  +palpitations. GASTROINTESTINAL:  No abdominal pain.  No nausea or vomiting.  No diarrhea or constipation.  No hematemesis.  No hematochezia.  No melena. GENITOURINARY:  No urgency.  No frequency.  No dysuria.  No hematuria.  No obstructive symptoms.  No discharge.  No pain.  No significant abnormal bleeding. MUSCULOSKELETAL:  No musculoskeletal pain.  No joint swelling.  No arthritis. NEUROLOGICAL:  No confusion.  No weakness. No headache. No seizure. PSYCHIATRIC:  No depression. No anxiety. No suicidal ideation. SKIN:  No rashes.  No lesions.  No wounds. ENDOCRINE:  No unexplained weight loss.  No polydipsia.  No polyuria.  No polyphagia. HEMATOLOGIC:  No anemia.  No purpura.  No petechiae.  No bleeding.  ALLERGIC AND IMMUNOLOGIC:  No pruritus.  No swelling Other:  Past Medical and Surgical History:   Past Medical History  Diagnosis Date  . Vertigo   . Asthma     as a child  . Diabetes mellitus Dx 2009  . Hypertension Dx 2009  . H/O TIA (transient ischemic  attack) and stroke 01/04/2015    06/2014 with L hand numbness    Past Surgical History  Procedure Laterality Date  . Mouth surgery      Social History:   reports that he quit smoking about 2 years ago. His smoking use included Cigarettes. He has a 3 pack-year smoking history. He has never used smokeless tobacco. He reports that he does not drink alcohol or use illicit drugs.   Allergies  Allergen Reactions  . Slo-Bid Gyrocaps [Theophylline] Other (See Comments)    Unknown childhood reaction    Family History  Problem Relation Age of Onset  . COPD Father   . Hypertension Father   . Heart failure Father   . Diabetes Father   . Sleep apnea Brother   . Hypertension Brother   . Diabetes Mother   . Hyperlipidemia Mother       Prior to Admission medications   Medication Sig Start Date End Date Taking? Authorizing Provider  albuterol (PROAIR HFA) 108 (90 BASE) MCG/ACT inhaler Inhale 1-2 puffs into the lungs every 3 (three) hours as needed for wheezing or shortness of breath. 02/08/15  Yes Josalyn Funches, MD  aspirin-sod bicarb-citric acid (ALKA-SELTZER) 325 MG TBEF tablet Take 650 mg by mouth every 6 (six) hours as needed. For indigestion   Yes Historical Provider, MD  cyclobenzaprine (FLEXERIL) 10 MG tablet Take 10 mg by mouth 3 (three) times daily as needed for muscle  spasms.   Yes Historical Provider, MD  ibuprofen (ADVIL,MOTRIN) 800 MG tablet Take 1 tablet (800 mg total) by mouth every 8 (eight) hours as needed for headache (pain). 06/22/15  Yes Josalyn Funches, MD  insulin NPH-regular Human (NOVOLIN 70/30) (70-30) 100 UNIT/ML injection Inject 6 Units into the skin 2 (two) times daily with a meal. Patient taking differently: Inject 2 Units into the skin 2 (two) times daily with a meal.  04/14/15  Yes Josalyn Funches, MD  lisinopril (PRINIVIL,ZESTRIL) 2.5 MG tablet Take 1 tablet (2.5 mg total) by mouth daily. 01/04/15  Yes Boykin Nearing, MD  metFORMIN (GLUCOPHAGE) 500 MG tablet Take  2 tablets (1,000 mg total) by mouth 2 (two) times daily with a meal. 03/09/15  Yes Josalyn Funches, MD  beclomethasone (QVAR) 40 MCG/ACT inhaler Inhale 1 puff into the lungs 2 (two) times daily. Patient not taking: Reported on 07/06/2015 01/04/15   Boykin Nearing, MD  Blood Glucose Monitoring Suppl (TRUE METRIX AIR GLUCOSE METER) W/DEVICE KIT 6 Units by Does not apply route 2 (two) times daily before a meal. 04/14/15   Josalyn Funches, MD  glucose blood (TRUE METRIX BLOOD GLUCOSE TEST) test strip Use as instructed 04/14/15   Boykin Nearing, MD  Insulin Syringes, Disposable, U-100 0.5 ML MISC 200 02/08/15   Boykin Nearing, MD  TRUEPLUS LANCETS 30G MISC As directed 04/14/15   Boykin Nearing, MD    Physical Exam: BP 151/81 mmHg  Pulse 87  Temp(Src) 99.3 F (37.4 C) (Oral)  Resp 14  Ht _0  (1.753 m)  Wt 144.607 kg (318 lb 12.8 oz)  BMI 47.06 kg/m2  SpO2 97%  GENERAL : Well developed, well nourished, alert and cooperative, and appears to be in no acute distress. HEAD: normocephalic. EYES: PERRL, EOMI.  EARS: , hearing grossly intact. NOSE: No nasal discharge. THROAT: Oral cavity and pharynx normal.  NECK: Neck supple, non-tender. CARDIAC: Normal S1 and S2. No S3, S4 or murmurs. Rhythm is regular. There is no peripheral edema, cyanosis or pallor. Extremities are warm and well perfused. No carotid bruits. LUNGS: Clear to auscultation  ABDOMEN: Positive bowel sounds. Soft, nondistended, nontender. No guarding or rebound. No masses. NEUROLOGICAL: The mental examination revealed the patient was oriented to person, place, and time.CN II-XII intact. Strength and sensation symmetric and intact throughout. SKIN: Skin normal color, texture and turgor with no lesions or eruptions. PSYCHIATRIC:  The patient was able to demonstrate good judgement and reason, without hallucinations, abnormal affect or abnormal behaviors during the examination. Patient is not suicidal.          Labs on Admission:    Reviewed.   Radiological Exams on Admission: Dg Chest 2 View  07/06/2015  CLINICAL DATA:  Chest pain for 1 day EXAM: CHEST  2 VIEW COMPARISON:  June 28, 2014 FINDINGS: The heart size and mediastinal contours are within normal limits. Both lungs are clear. The visualized skeletal structures are unremarkable. IMPRESSION: No active cardiopulmonary disease. Electronically Signed   By: Abelardo Diesel M.D.   On: 07/06/2015 18:42    EKG:  Independently reviewed. T wave inversions in inferior leads.   Assessment/Plan  Dyspnea: likely due to anxiety, seems similar to prior "anxiety attacks" per patient.  DDx : asthma ( not well controlled using albuterol daily without control medication)            ACS with risk factors and T wave changes: will r/o ACS with serial trops and EKG.  HTN: cont home meds  DM: continue insulin  with low correction, hold metformin for tonight.   DVT prophylaxis: [place] enoxaparin Code Status: Full code       [name] M.D [facility]

## 2015-07-06 NOTE — ED Notes (Signed)
Pt reports at home he started to feel bad then had heart palpitations with sob. Pt diaphoretic in triage. No cardiac hx.

## 2015-07-06 NOTE — ED Provider Notes (Signed)
CSN: [id number]     Arrival date & time 07/06/15  1714 History   First MD Initiated Contact with Patient 07/06/15 1830     Chief Complaint  Patient presents with  . Palpitations     (Consider location/radiation/quality/duration/timing/severity/associated sxs/prior Treatment) HPI Comments: The patient is a 39 year old male, he is morbidly obese, he is a diabetic, has hypertension, is recently separated from his wife, states that while he was at work today he felt a little bit short of breath but this went away without intervention, when he went home he became acutely weak and had more shortness of breath and some diaphoresis. This was short-lived and improved with albuterol, he no longer feels short of breath, no longer feels diaphoretic, no longer feels palpitations and his anxiety has reduced. He states that he gets anxious very easily and in fact recently had unprotected sexual encounters with multiple females after being separated from his wife, on one occasion he had oral sex with a another person and immediately felt very anxious because he thought he might of contracted an STD, testing was negative per the patient. He denies any chest pain abdominal pain or swelling of the legs, denies fevers chills nausea vomiting or diarrhea.  Patient is a 39 y.o. male presenting with palpitations. The history is provided by the patient.  Palpitations   Past Medical History  Diagnosis Date  . Vertigo   . Asthma     as a child  . Diabetes mellitus Dx 2009  . Hypertension Dx 2009  . H/O TIA (transient ischemic attack) and stroke 01/04/2015    06/2014 with L hand numbness    Past Surgical History  Procedure Laterality Date  . Mouth surgery     Family History  Problem Relation Age of Onset  . COPD Father   . Hypertension Father   . Heart failure Father   . Diabetes Father   . Sleep apnea Brother   . Hypertension Brother   . Diabetes Mother   . Hyperlipidemia Mother    Social History   Substance Use Topics  . Smoking status: Former Smoker -- 0.30 packs/day for 10 years    Types: Cigarettes    Quit date: 05/28/2013  . Smokeless tobacco: Never Used  . Alcohol Use: No    Review of Systems  Cardiovascular: Positive for palpitations.  All other systems reviewed and are negative.     Allergies  Slo-bid gyrocaps  Home Medications   Prior to Admission medications   Medication Sig Start Date End Date Taking? Authorizing Provider  albuterol (PROAIR HFA) 108 (90 BASE) MCG/ACT inhaler Inhale 1-2 puffs into the lungs every 3 (three) hours as needed for wheezing or shortness of breath. 02/08/15  Yes Josalyn Funches, MD  aspirin-sod bicarb-citric acid (ALKA-SELTZER) 325 MG TBEF tablet Take 650 mg by mouth every 6 (six) hours as needed. For indigestion   Yes Historical Provider, MD  cyclobenzaprine (FLEXERIL) 10 MG tablet Take 10 mg by mouth 3 (three) times daily as needed for muscle spasms.   Yes Historical Provider, MD  ibuprofen (ADVIL,MOTRIN) 800 MG tablet Take 1 tablet (800 mg total) by mouth every 8 (eight) hours as needed for headache (pain). 06/22/15  Yes Josalyn Funches, MD  insulin NPH-regular Human (NOVOLIN 70/30) (70-30) 100 UNIT/ML injection Inject 6 Units into the skin 2 (two) times daily with a meal. Patient taking differently: Inject 2 Units into the skin 2 (two) times daily with a meal.  04/14/15  Yes Boykin Nearing, MD  lisinopril (PRINIVIL,ZESTRIL) 2.5 MG tablet Take 1 tablet (2.5 mg total) by mouth daily. 01/04/15  Yes Boykin Nearing, MD  metFORMIN (GLUCOPHAGE) 500 MG tablet Take 2 tablets (1,000 mg total) by mouth 2 (two) times daily with a meal. 03/09/15  Yes Josalyn Funches, MD  beclomethasone (QVAR) 40 MCG/ACT inhaler Inhale 1 puff into the lungs 2 (two) times daily. Patient not taking: Reported on 07/06/2015 01/04/15   Boykin Nearing, MD  Blood Glucose Monitoring Suppl (TRUE METRIX AIR GLUCOSE METER) W/DEVICE KIT 6 Units by Does not apply route 2 (two) times  daily before a meal. 04/14/15   Josalyn Funches, MD  glucose blood (TRUE METRIX BLOOD GLUCOSE TEST) test strip Use as instructed 04/14/15   Boykin Nearing, MD  Insulin Syringes, Disposable, U-100 0.5 ML MISC 200 02/08/15   Josalyn Funches, MD  TRUEPLUS LANCETS 30G MISC As directed 04/14/15   Josalyn Funches, MD   BP 149/80 mmHg  Pulse 89  Temp(Src) 99.3 F (37.4 C) (Oral)  Resp 15  Ht '5\' 9"'  (1.753 m)  Wt 318 lb 12.8 oz (144.607 kg)  BMI 47.06 kg/m2  SpO2 100% Physical Exam  Constitutional: He appears well-developed and well-nourished. No distress.  HENT:  Head: Normocephalic and atraumatic.  Mouth/Throat: Oropharynx is clear and moist. No oropharyngeal exudate.  Eyes: Conjunctivae and EOM are normal. Pupils are equal, round, and reactive to light. Right eye exhibits no discharge. Left eye exhibits no discharge. No scleral icterus.  Neck: Normal range of motion. Neck supple. No JVD present. No thyromegaly present.  Cardiovascular: Normal rate, regular rhythm, normal heart sounds and intact distal pulses.  Exam reveals no gallop and no friction rub.   No murmur heard. Pulmonary/Chest: Effort normal and breath sounds normal. No respiratory distress. He has no wheezes. He has no rales.  Abdominal: Soft. Bowel sounds are normal. He exhibits no distension and no mass. There is no tenderness.  Musculoskeletal: Normal range of motion. He exhibits no edema or tenderness.  Lymphadenopathy:    He has no cervical adenopathy.  Neurological: He is alert. Coordination normal.  Skin: Skin is warm and dry. No rash noted. No erythema.  Psychiatric: He has a normal mood and affect. His behavior is normal.  Nursing note and vitals reviewed.   ED Course  Procedures (including critical care time) Labs Review Labs Reviewed  BASIC METABOLIC PANEL - Abnormal; Notable for the following:    Chloride 99 (*)    Glucose, Bld 214 (*)    All other components within normal limits  CBG MONITORING, ED -  Abnormal; Notable for the following:    Glucose-Capillary 202 (*)    All other components within normal limits  CBC  URINALYSIS, ROUTINE W REFLEX MICROSCOPIC (NOT AT [facility])  D-DIMER, QUANTITATIVE (NOT AT [facility])  Randolm Idol, ED    Imaging Review Dg Chest 2 View  07/06/2015  CLINICAL DATA:  Chest pain for 1 day EXAM: CHEST  2 VIEW COMPARISON:  June 28, 2014 FINDINGS: The heart size and mediastinal contours are within normal limits. Both lungs are clear. The visualized skeletal structures are unremarkable. IMPRESSION: No active cardiopulmonary disease. Electronically Signed   By: Abelardo Diesel M.D.   On: 07/06/2015 18:42   I have personally reviewed and evaluated these images and lab results as part of my medical decision-making.   EKG Interpretation   Date/Time:  Thursday July 06 2015 17:25:40 EDT Ventricular Rate:  106 PR Interval:  138 QRS Duration: 92 QT Interval:  342 QTC Calculation:  454 R Axis:   101 Text Interpretation:  Sinus tachycardia Rightward axis T wave abnormality,  consider inferior ischemia Abnormal ECG Since last tracing T wave  abnormality NOW SEEN Confirmed by Jaquis Picklesimer  MD, Selma Mink ([id number]) on 07/06/2015  6:31:28 PM      MDM   Final diagnoses:  Shortness of breath  Abnormal ECG    The patient has no reproducible tenderness on his abdomen or his chest wall, his lungs are clear, his heart is borderline tachycardic, there is no murmurs, no peripheral edema. He does appear mildly anxious, he speaks in full sentences, his oxygen is 97%. The EKG does have some nonspecific T-wave findings in the inferior leads, he does have a new right axis, will obtain a d-dimer to rule out pulmonary embolism, this could be related to anxiety, given his history of diabetes and morbid obesity and relative immobility will obtain further testing to rule out thromboembolic event.  Care was discussed with the hospitalist who will admit the patient to the hospital for persistent  abnormal EKG with symptoms that could be consistent with a coronary equivalent. The patient was informed of these findings, he has had no abnormal troponin, no abnormal chest x-ray, vital signs unremarkable, d-dimer normal.    Noemi Chapel, MD 07/06/15 2212

## 2015-07-06 NOTE — Progress Notes (Signed)
Placed patient on CPAP and he is tolerating it well, RT will continue to monitor.

## 2015-07-07 ENCOUNTER — Observation Stay (HOSPITAL_BASED_OUTPATIENT_CLINIC_OR_DEPARTMENT_OTHER): Payer: 59

## 2015-07-07 DIAGNOSIS — R0600 Dyspnea, unspecified: Secondary | ICD-10-CM

## 2015-07-07 DIAGNOSIS — R079 Chest pain, unspecified: Secondary | ICD-10-CM

## 2015-07-07 DIAGNOSIS — J45909 Unspecified asthma, uncomplicated: Secondary | ICD-10-CM | POA: Diagnosis not present

## 2015-07-07 LAB — GLUCOSE, CAPILLARY
GLUCOSE-CAPILLARY: 152 mg/dL — AB (ref 65–99)
Glucose-Capillary: 174 mg/dL — ABNORMAL HIGH (ref 65–99)
Glucose-Capillary: 220 mg/dL — ABNORMAL HIGH (ref 65–99)
Glucose-Capillary: 236 mg/dL — ABNORMAL HIGH (ref 65–99)

## 2015-07-07 LAB — TROPONIN I: Troponin I: <0.03 ng/mL (ref <0.031)

## 2015-07-07 MED ORDER — INSULIN ASPART PROT & ASPART (70-30 MIX) 100 UNIT/ML ~~LOC~~ SUSP: 6.0000 [IU] | Freq: Two times a day (BID) | SUBCUTANEOUS | Status: DC

## 2015-07-07 NOTE — Progress Notes (Signed)
  Echocardiogram 2D Echocardiogram has been performed.  Janalyn HarderWest, Kyrell Ruacho R 07/07/2015, 4:56 PM

## 2015-07-07 NOTE — Progress Notes (Signed)
NURSING PROGRESS NOTE  Michael Foster [id number] Discharge Data: 07/07/2015 6:22 PM Attending Provider: Albertine Patricia, MD QFD:[id number], Lennox Laity, MD     Raliegh Ip D/C'd Home per MD order.  Discussed with the patient the After Visit Summary and all questions fully answered. All IV's discontinued with no bleeding noted. All belongings returned to patient for patient to take home.   Last Vital Signs:  Blood pressure 135/82, pulse 91, temperature 98.8 F (37.1 C), temperature source Oral, resp. rate 19, height '5\' 9"'  (1.753 m), weight 142.883 kg (315 lb), SpO2 100 %.  Discharge Medication List   Medication List    STOP taking these medications        aspirin-sod bicarb-citric acid 325 MG Tbef tablet  Commonly known as:  ALKA-SELTZER     ibuprofen 800 MG tablet  Commonly known as:  ADVIL,MOTRIN      TAKE these medications        albuterol 108 (90 BASE) MCG/ACT inhaler  Commonly known as:  PROAIR HFA  Inhale 1-2 puffs into the lungs every 3 (three) hours as needed for wheezing or shortness of breath.     beclomethasone 40 MCG/ACT inhaler  Commonly known as:  QVAR  Inhale 1 puff into the lungs 2 (two) times daily.     cyclobenzaprine 10 MG tablet  Commonly known as:  FLEXERIL  Take 10 mg by mouth 3 (three) times daily as needed for muscle spasms.     glucose blood test strip  Commonly known as:  TRUE METRIX BLOOD GLUCOSE TEST  Use as instructed     insulin NPH-regular Human (70-30) 100 UNIT/ML injection  Commonly known as:  NOVOLIN 70/30  Inject 6 Units into the skin 2 (two) times daily with a meal.     Insulin Syringes (Disposable) U-100 0.5 ML Misc  200     lisinopril 2.5 MG tablet  Commonly known as:  PRINIVIL,ZESTRIL  Take 1 tablet (2.5 mg total) by mouth daily.     metFORMIN 500 MG tablet  Commonly known as:  GLUCOPHAGE  Take 2 tablets (1,000 mg total) by mouth 2 (two) times daily with a meal.     TRUE METRIX AIR GLUCOSE METER W/DEVICE Kit  6 Units  by Does not apply route 2 (two) times daily before a meal.     TRUEPLUS LANCETS 30G Misc  As directed         Doristine Devoid, RN

## 2015-07-07 NOTE — Discharge Instructions (Signed)
Follow with Primary MD Lora PaulaFUNCHES, JOSALYN C, MD in 7 days   Get CBC, CMP, 2 view Chest X ray checked  by Primary MD next visit.    Activity: As tolerated with Full fall precautions use walker/cane & assistance as needed   Disposition Home    Diet: Heart Healthy , carbohydrate modified , with feeding assistance and aspiration precautions.  For Heart failure patients - Check your Weight same time everyday, if you gain over 2 pounds, or you develop in leg swelling, experience more shortness of breath or chest pain, call your Primary MD immediately. Follow Cardiac Low Salt Diet and 1.5 lit/day fluid restriction.   On your next visit with your primary care physician please Get Medicines reviewed and adjusted.   Please request your Prim.MD to go over all Hospital Tests and Procedure/Radiological results at the follow up, please get all Hospital records sent to your Prim MD by signing hospital release before you go home.   If you experience worsening of your admission symptoms, develop shortness of breath, life threatening emergency, suicidal or homicidal thoughts you must seek medical attention immediately by calling 911 or calling your MD immediately  if symptoms less severe.  You Must read complete instructions/literature along with all the possible adverse reactions/side effects for all the Medicines you take and that have been prescribed to you. Take any new Medicines after you have completely understood and accpet all the possible adverse reactions/side effects.   Do not drive, operating heavy machinery, perform activities at heights, swimming or participation in water activities or provide baby sitting services if your were admitted for syncope or siezures until you have seen by Primary MD or a Neurologist and advised to do so again.  Do not drive when taking Pain medications.    Do not take more than prescribed Pain, Sleep and Anxiety Medications  Special Instructions: If you have  smoked or chewed Tobacco  in the last 2 yrs please stop smoking, stop any regular Alcohol  and or any Recreational drug use.  Wear Seat belts while driving.   Please note  You were cared for by a hospitalist during your hospital stay. If you have any questions about your discharge medications or the care you received while you were in the hospital after you are discharged, you can call the unit and asked to speak with the hospitalist on call if the hospitalist that took care of you is not available. Once you are discharged, your primary care physician will handle any further medical issues. Please note that NO REFILLS for any discharge medications will be authorized once you are discharged, as it is imperative that you return to your primary care physician (or establish a relationship with a primary care physician if you do not have one) for your aftercare needs so that they can reassess your need for medications and monitor your lab values.

## 2015-07-07 NOTE — Discharge Summary (Signed)
Michael Foster, is a 39 y.o. male  DOB Oct 10, 1975  MRN [id number].  Admission date:  07/06/2015  Admitting Physician  [name], MD  Discharge Date:  07/07/2015   Primary MD  Michael Ends, MD  Recommendations for primary care physician for things to follow:  - Check basic labs including CBC, BMP during next visit.   Admission Diagnosis  Shortness of breath [R06.02] Abnormal ECG [R94.31]   Discharge Diagnosis  Shortness of breath [R06.02] Abnormal ECG [R94.31]    Active Problems:   Chest pain   Dyspnea      Past Medical History  Diagnosis Date  . Vertigo   . Asthma     as a child  . Diabetes mellitus Dx 2009  . Hypertension Dx 2009  . H/O TIA (transient ischemic attack) and stroke 01/04/2015    06/2014 with L hand numbness     Past Surgical History  Procedure Laterality Date  . Mouth surgery         History of present illness and  Hospital Course:     Kindly see H&P for history of present illness and admission details, please review complete Labs, Consult reports and Test reports for all details in brief  HPI  from the history and physical done on the day of admission Patient is a 39 year old male with history of morbid obesity, DM II, Hypertension, asthma , anxiety who came with cc of dyspnea that started suddenly today after he got back from work. This was associated with diaphoresis and had palpitations. He denied chest pain, cough, fever, chills or wheezing. No history of ischemic heart disease. He smoked for 10+ years for about 1ppd but quit.    Hospital Course   Chest pain and dyspnea - Patient denies any chest pain , actually described more palpitation than chest pain, and this was after he took his albuterol inhaler, but patient most likely related to albuterol, reports his main complaints was related to dyspnea, EKG appears to be non acute, troponins are negative  3, no significant events on telemetry, 2-D echo with normal EF 55-60% , no wall motion abnormality, no diastolic dysfunction, d-dimer is within normal limits. - Was able to walk multiple rounds in the hallway with no recurrence of symptoms.  Asthma - No active wheezing,   Diabetes mellitus  continue with home medication  Hypertension - Blood pressure acceptable, continue with home medication     Discharge Condition:  stable   Follow UP  Follow-up Information    Follow up with Michael Ends, MD. Call in 1 week.   Specialty:  Family Medicine   Why:  Posthospitalization follow-up   Contact information:   [name] [address] [phone]         Discharge Instructions  and  Discharge Medications         Discharge Instructions    Discharge instructions    Complete by:  As directed   Follow with Primary MD Michael Ends,  MD in 7 days   Get CBC, CMP, 2 view Chest X ray checked  by Primary MD next visit.    Activity: As tolerated with Full fall precautions use walker/cane & assistance as needed   Disposition Home    Diet: Heart Healthy , carbohydrate modified , with feeding assistance and aspiration precautions.  For Heart failure patients - Check your Weight same time everyday, if you gain over 2 pounds, or you develop in leg swelling, experience more shortness of breath or chest pain, call your Primary MD immediately. Follow Cardiac Low Salt Diet and 1.5 lit/day fluid restriction.   On your next visit with your primary care physician please Get Medicines reviewed and adjusted.   Please request your Prim.MD to go over all Hospital Tests and Procedure/Radiological results at the follow up, please get all Hospital records sent to your Prim MD by signing hospital release before you go home.   If you experience worsening of your admission symptoms, develop shortness of breath, life threatening emergency, suicidal or homicidal thoughts  you must seek medical attention immediately by calling 911 or calling your MD immediately  if symptoms less severe.  You Must read complete instructions/literature along with all the possible adverse reactions/side effects for all the Medicines you take and that have been prescribed to you. Take any new Medicines after you have completely understood and accpet all the possible adverse reactions/side effects.   Do not drive, operating heavy machinery, perform activities at heights, swimming or participation in water activities or provide baby sitting services if your were admitted for syncope or siezures until you have seen by Primary MD or a Neurologist and advised to do so again.  Do not drive when taking Pain medications.    Do not take more than prescribed Pain, Sleep and Anxiety Medications  Special Instructions: If you have smoked or chewed Tobacco  in the last 2 yrs please stop smoking, stop any regular Alcohol  and or any Recreational drug use.  Wear Seat belts while driving.   Please note  You were cared for by a hospitalist during your hospital stay. If you have any questions about your discharge medications or the care you received while you were in the hospital after you are discharged, you can call the unit and asked to speak with the hospitalist on call if the hospitalist that took care of you is not available. Once you are discharged, your primary care physician will handle any further medical issues. Please note that NO REFILLS for any discharge medications will be authorized once you are discharged, as it is imperative that you return to your primary care physician (or establish a relationship with a primary care physician if you do not have one) for your aftercare needs so that they can reassess your need for medications and monitor your lab values.     Increase activity slowly    Complete by:  As directed             Medication List    STOP taking these medications         aspirin-sod bicarb-citric acid 325 MG Tbef tablet  Commonly known as:  ALKA-SELTZER     ibuprofen 800 MG tablet  Commonly known as:  ADVIL,MOTRIN      TAKE these medications        albuterol 108 (90 BASE) MCG/ACT inhaler  Commonly known as:  PROAIR HFA  Inhale 1-2 puffs into the lungs every 3 (three) hours as needed  for wheezing or shortness of breath.     beclomethasone 40 MCG/ACT inhaler  Commonly known as:  QVAR  Inhale 1 puff into the lungs 2 (two) times daily.     cyclobenzaprine 10 MG tablet  Commonly known as:  FLEXERIL  Take 10 mg by mouth 3 (three) times daily as needed for muscle spasms.     glucose blood test strip  Commonly known as:  TRUE METRIX BLOOD GLUCOSE TEST  Use as instructed     insulin NPH-regular Human (70-30) 100 UNIT/ML injection  Commonly known as:  NOVOLIN 70/30  Inject 6 Units into the skin 2 (two) times daily with a meal.     Insulin Syringes (Disposable) U-100 0.5 ML Misc  200     lisinopril 2.5 MG tablet  Commonly known as:  PRINIVIL,ZESTRIL  Take 1 tablet (2.5 mg total) by mouth daily.     metFORMIN 500 MG tablet  Commonly known as:  GLUCOPHAGE  Take 2 tablets (1,000 mg total) by mouth 2 (two) times daily with a meal.     TRUE METRIX AIR GLUCOSE METER W/DEVICE Kit  6 Units by Does not apply route 2 (two) times daily before a meal.     TRUEPLUS LANCETS 30G Misc  As directed          Diet and Activity recommendation: See Discharge Instructions above   Consults obtained -  None    Major procedures and Radiology Reports - PLEASE review detailed and final reports for all details, in brief -  2-D echo  Dg Chest 2 View  07/06/2015  CLINICAL DATA:  Chest pain for 1 day EXAM: CHEST  2 VIEW COMPARISON:  June 28, 2014 FINDINGS: The heart size and mediastinal contours are within normal limits. Both lungs are clear. The visualized skeletal structures are unremarkable. IMPRESSION: No active cardiopulmonary disease. Electronically  Signed   By: Abelardo Diesel M.D.   On: 07/06/2015 18:42    Micro Results     No results found for this or any previous visit (from the past 240 hour(s)).     Today   Subjective:   Keegen Heffern today has no headache,no new weakness tingling or numbness, feels much better wants to go home today.   Objective:   Blood pressure 135/82, pulse 91, temperature 98.8 F (37.1 C), temperature source Oral, resp. rate 19, height '5\' 9"'  (1.753 m), weight 142.883 kg (315 lb), SpO2 100 %.   Intake/Output Summary (Last 24 hours) at 07/07/15 1802 Last data filed at 07/07/15 1332  Gross per 24 hour  Intake    701 ml  Output   1100 ml  Net   -399 ml    Exam Awake Alert, Oriented x 3, No new F.N deficits, Normal affect [place].AT,PERRAL Supple Neck,No JVD, No cervical lymphadenopathy appriciated.  Symmetrical Chest wall movement, Good air movement bilaterally, CTAB RRR,No Gallops,Rubs or new Murmurs, No Parasternal Heave +ve B.Sounds, Abd Soft, Non tender, No organomegaly appriciated, No rebound -guarding or rigidity. No Cyanosis, Clubbing or edema, No new Rash or bruise  Data Review   CBC w Diff:  Lab Results  Component Value Date   WBC 10.5 07/06/2015   HGB 15.6 07/06/2015   HCT 46.9 07/06/2015   PLT 379 07/06/2015   LYMPHOPCT 25 01/26/2015   BANDSPCT 0 07/22/2014   MONOPCT 7 01/26/2015   EOSPCT 3 01/26/2015   BASOPCT 1 01/26/2015    CMP:  Lab Results  Component Value Date   NA 136 07/06/2015  K 3.7 07/06/2015   CL 99* 07/06/2015   CO2 29 07/06/2015   BUN 8 07/06/2015   CREATININE 0.68 07/06/2015   CREATININE 0.55 09/27/2013   PROT 7.5 01/26/2015   ALBUMIN 3.7 01/26/2015   BILITOT 0.6 01/26/2015   ALKPHOS 108 01/26/2015   AST 16 01/26/2015   ALT 18 01/26/2015  .   Total Time in preparing paper work, data evaluation and todays exam - 35 minutes  Diandre Merica M.D on 07/07/2015 at 6:02 PM  [facility]   Office  [phone]

## 2015-07-07 NOTE — Progress Notes (Signed)
Results for [name], Michael Foster (MRN [id number]) as of 07/07/2015 09:49  Ref. Range 07/06/2015 17:37 07/06/2015 23:37 07/07/2015 06:03  Glucose-Capillary Latest Ref Range: 65-99 mg/dL 295202 (H) 284174 (H) 132236 (H)  Noted that blood sugars greater than 180 mg/dl.  Recommend increasing 70/30 insulin to 6 units BID which is his home dose. Continue Novolog SENSITIVE correction scale TID & HS. Will continue to monitor blood sugars while in the hospital. Smith MinceKendra Dawaun Brancato RN BSN CDE

## 2015-07-11 ENCOUNTER — Ambulatory Visit: Payer: 59 | Attending: Family Medicine | Admitting: Pharmacist

## 2015-07-11 DIAGNOSIS — E119 Type 2 diabetes mellitus without complications: Secondary | ICD-10-CM | POA: Insufficient documentation

## 2015-07-11 DIAGNOSIS — Z794 Long term (current) use of insulin: Secondary | ICD-10-CM | POA: Insufficient documentation

## 2015-07-11 DIAGNOSIS — E1165 Type 2 diabetes mellitus with hyperglycemia: Secondary | ICD-10-CM

## 2015-07-11 NOTE — Progress Notes (Signed)
S:    Patient arrives very anxious.  Presents for diabetes follow up after recent discharge from the hospital.   Patient denies adherence with medications. He does not take metformin or lisinopril. He also typically takes on 2 units of his insulin ONCE a day due to fear of hypoglycemia.  Current diabetes medications include 70/30 insulin 2 units once a day. He is supposed to be taking 70/30 6 units twice a day and metformin 1000 mg BID.   Patient denies hypoglycemic events.  Patient reported dietary habits: has been trying to avoid fried foods and working to [employer] at home and eat healthier.   Patient reported exercise habits: walking around the hospital during work.    Patient denies nocturia.  Patient denies neuropathy. Patient denies visual changes. Patient reports self foot exams.   Patient reports being very anxious and this is a baseline for him. He reports that he is trying to change his life as the hospital admission scared him into trying to be healthier. He sees a therapist who is helping him with marriage counseling. He is going to consider discussing his anxiety about his health as well.    O:  Lab Results  Component Value Date   HGBA1C 9.10 04/14/2015   Patient did not bring a meter or log to visit.  Home fasting CBG: 150-200   A/P: Diabetes currently uncontrolled based on A1c of 9.1 and home readings.   Patient denies hypoglycemic events and is able to verbalize appropriate hypoglycemia management plan.  Patient denies adherence with medication. Control is suboptimal due to medication nonadherence.  Patient has significant fear of hypoglycemia and death. Discussed switching insulins to basal and bolus to decrease risk of hypoglycemia but patient refused because of multiple injections and because family members have gotten sick from using insulin pens. I think that it is more important to address the psychosocial issues to get the patient to be compliant with medications.  Patient agreed to taking lisinopril for renal protection but refused any changes in diabetes medications. I provided him with Hulda MarinJamie McMannes, social worker's card for further counseling and encouraged him to also address this with Dr. Armen PickupFunches. Patient verbalized understanding and would make an appointment with Asher MuirJamie at check out.   Next A1C anticipated after 10/22 (too soon today).    Written patient instructions provided.  Total time in face to face counseling 35 minutes.   Follow up in Pharmacist Clinic Visit as needed.

## 2015-07-11 NOTE — Patient Instructions (Signed)
Make an appointment with Dr. Armen PickupFunches - we can get your A1c at that point.  Bring your meter or a blood glucose log to your next visit  Check your strips and make sure that they are not expired!

## 2015-07-14 ENCOUNTER — Ambulatory Visit: Payer: 59 | Attending: Family Medicine | Admitting: Family Medicine

## 2015-07-14 ENCOUNTER — Encounter: Payer: Self-pay | Admitting: Family Medicine

## 2015-07-14 VITALS — BP 136/86 | HR 82 | Temp 98.9°F | Resp 16 | Ht 69.0 in | Wt 317.0 lb

## 2015-07-14 DIAGNOSIS — M549 Dorsalgia, unspecified: Secondary | ICD-10-CM | POA: Insufficient documentation

## 2015-07-14 DIAGNOSIS — Z7984 Long term (current) use of oral hypoglycemic drugs: Secondary | ICD-10-CM | POA: Diagnosis not present

## 2015-07-14 DIAGNOSIS — M545 Low back pain, unspecified: Secondary | ICD-10-CM

## 2015-07-14 DIAGNOSIS — E119 Type 2 diabetes mellitus without complications: Secondary | ICD-10-CM | POA: Diagnosis present

## 2015-07-14 DIAGNOSIS — K3184 Gastroparesis: Secondary | ICD-10-CM | POA: Diagnosis not present

## 2015-07-14 DIAGNOSIS — E1143 Type 2 diabetes mellitus with diabetic autonomic (poly)neuropathy: Secondary | ICD-10-CM | POA: Diagnosis not present

## 2015-07-14 DIAGNOSIS — Z87891 Personal history of nicotine dependence: Secondary | ICD-10-CM | POA: Diagnosis not present

## 2015-07-14 DIAGNOSIS — Z79899 Other long term (current) drug therapy: Secondary | ICD-10-CM | POA: Insufficient documentation

## 2015-07-14 DIAGNOSIS — Z794 Long term (current) use of insulin: Secondary | ICD-10-CM | POA: Diagnosis not present

## 2015-07-14 DIAGNOSIS — E1165 Type 2 diabetes mellitus with hyperglycemia: Secondary | ICD-10-CM | POA: Diagnosis not present

## 2015-07-14 LAB — POCT GLYCOSYLATED HEMOGLOBIN (HGB A1C): Hemoglobin A1C: 9.5

## 2015-07-14 LAB — GLUCOSE, POCT (MANUAL RESULT ENTRY): POC GLUCOSE: 243 mg/dL — AB (ref 70–99)

## 2015-07-14 MED ORDER — CYCLOBENZAPRINE HCL 10 MG PO TABS: 10.0000 mg | ORAL_TABLET | Freq: Three times a day (TID) | ORAL | Status: DC | PRN

## 2015-07-14 MED ORDER — IBUPROFEN 800 MG PO TABS: 800.0000 mg | ORAL_TABLET | Freq: Three times a day (TID) | ORAL | Status: DC | PRN

## 2015-07-14 MED ORDER — METOCLOPRAMIDE HCL 10 MG PO TABS: 10.0000 mg | ORAL_TABLET | Freq: Three times a day (TID) | ORAL | Status: DC

## 2015-07-14 NOTE — Progress Notes (Signed)
F/U DM Elevated glucose today Stated had lunch not to long ago  Will have insulin at home

## 2015-07-14 NOTE — Patient Instructions (Addendum)
Ketogenic diet  Michael BurdockRichard was seen today for diabetes.  Diagnoses and all orders for this visit:  Type 2 diabetes mellitus with hyperglycemia, unspecified long term insulin use status (HCC) -     POCT glycosylated hemoglobin (Hb A1C) -     POCT glucose (manual entry) -     POCT glucose (manual entry)  Bilateral low back pain without sciatica -     ibuprofen (ADVIL,MOTRIN) 800 MG tablet; Take 1 tablet (800 mg total) by mouth every 8 (eight) hours as needed. -     cyclobenzaprine (FLEXERIL) 10 MG tablet; Take 1 tablet (10 mg total) by mouth 3 (three) times daily as needed for muscle spasms.  Diabetic gastroparesis associated with type 2 diabetes mellitus (HCC) -     metoCLOPramide (REGLAN) 10 MG tablet; Take 1 tablet (10 mg total) by mouth 3 (three) times daily before meals.   F/u in 3 months for diabetes  Dr. Armen PickupFunches

## 2015-07-14 NOTE — Progress Notes (Signed)
Patient ID: Michael Foster, male   DOB: 1976/07/13, 39 y.o.   MRN: [id number]   Subjective:  Patient ID: Michael Foster, male    DOB: 14-Feb-1976  Age: 39 y.o. MRN: [id number]  CC: Diabetes   HPI ITZEL LOWRIMORE presents for   1. CHRONIC DIABETES  Disease Monitoring  Blood Sugar Ranges: 150s fasting and post prandial   Polyuria: no   Visual problems: no   Medication Compliance: yes  Medication Side Effects  Hypoglycemia: no   2. HFU: panic attack. Has a Social worker. No recurrent SOB. Nervous about dying in his apartment alone.   3. Back pain: L sided. Low back. Requesting flexeril and ibuprofen refill. No recent injury.   Social History  Substance Use Topics  . Smoking status: Former Smoker -- 0.30 packs/day for 10 years    Types: Cigarettes    Quit date: 05/28/2013  . Smokeless tobacco: Never Used  . Alcohol Use: No    Outpatient Prescriptions Prior to Visit  Medication Sig Dispense Refill  . albuterol (PROAIR HFA) 108 (90 BASE) MCG/ACT inhaler Inhale 1-2 puffs into the lungs every 3 (three) hours as needed for wheezing or shortness of breath. 18 g 1  . beclomethasone (QVAR) 40 MCG/ACT inhaler Inhale 1 puff into the lungs 2 (two) times daily. 1 Inhaler 3  . Blood Glucose Monitoring Suppl (TRUE METRIX AIR GLUCOSE METER) W/DEVICE KIT 6 Units by Does not apply route 2 (two) times daily before a meal. 1 kit 0  . cyclobenzaprine (FLEXERIL) 10 MG tablet Take 10 mg by mouth 3 (three) times daily as needed for muscle spasms.    Marland Kitchen glucose blood (TRUE METRIX BLOOD GLUCOSE TEST) test strip Use as instructed 100 each 12  . insulin NPH-regular Human (NOVOLIN 70/30) (70-30) 100 UNIT/ML injection Inject 6 Units into the skin 2 (two) times daily with a meal. (Patient taking differently: Inject 2 Units into the skin 2 (two) times daily with a meal. ) 10 mL 3  . Insulin Syringes, Disposable, U-100 0.5 ML MISC 200 200 each 10  . lisinopril (PRINIVIL,ZESTRIL) 2.5 MG tablet Take 1 tablet (2.5 mg  total) by mouth daily. 30 tablet 11  . metFORMIN (GLUCOPHAGE) 500 MG tablet Take 2 tablets (1,000 mg total) by mouth 2 (two) times daily with a meal. 120 tablet 5  . TRUEPLUS LANCETS 30G MISC As directed 100 each 5   No facility-administered medications prior to visit.    ROS Review of Systems  Constitutional: Negative for fever, chills, fatigue and unexpected weight change.  Eyes: Negative for visual disturbance.  Respiratory: Negative for cough and shortness of breath.   Cardiovascular: Negative for chest pain, palpitations and leg swelling.  Gastrointestinal: Negative for nausea, vomiting, abdominal pain, diarrhea, constipation and blood in stool.  Endocrine: Negative for polydipsia, polyphagia and polyuria.  Musculoskeletal: Positive for back pain. Negative for myalgias, arthralgias, gait problem and neck pain.  Skin: Negative for rash.  Allergic/Immunologic: Negative for immunocompromised state.  Hematological: Negative for adenopathy. Does not bruise/bleed easily.  Psychiatric/Behavioral: Negative for suicidal ideas, sleep disturbance and dysphoric mood. The patient is nervous/anxious.     Objective:  BP 136/86 mmHg  Pulse 82  Temp(Src) 98.9 F (37.2 C) (Oral)  Resp 16  Ht '5\' 9"'  (1.753 m)  Wt 317 lb (143.79 kg)  BMI 46.79 kg/m2  SpO2 96%  BP/Weight 07/14/2015 07/07/2015 78/29/5621  Systolic BP 308 657 -  Diastolic BP 86 82 -  Wt. (Lbs) 317 - 315  BMI 46.79 - 46.5   Wt Readings from Last 3 Encounters:  07/14/15 317 lb (143.79 kg)  07/06/15 315 lb (142.883 kg)  06/09/15 324 lb (146.965 kg)    Physical Exam  Constitutional: He appears well-developed and well-nourished. No distress.  HENT:  Head: Normocephalic and atraumatic.  Neck: Normal range of motion. Neck supple.  Cardiovascular: Normal rate, regular rhythm, normal heart sounds and intact distal pulses.   Pulmonary/Chest: Effort normal and breath sounds normal.  Musculoskeletal: He exhibits no edema.    Neurological: He is alert.  Skin: Skin is warm and dry. No rash noted. No erythema.  Psychiatric: He has a normal mood and affect.    Lab Results  Component Value Date   HGBA1C 9.50 07/14/2015  CBG 243  Assessment & Plan:   Problem List Items Addressed This Visit    Backache   Relevant Medications   ibuprofen (ADVIL,MOTRIN) 800 MG tablet   cyclobenzaprine (FLEXERIL) 10 MG tablet   Diabetes (HCC) - Primary (Chronic)   Relevant Orders   POCT glycosylated hemoglobin (Hb A1C) (Completed)   POCT glucose (manual entry) (Completed)    Other Visit Diagnoses    Diabetic gastroparesis associated with type 2 diabetes mellitus (HCC)        Relevant Medications    metoCLOPramide (REGLAN) 10 MG tablet       No orders of the defined types were placed in this encounter.    Follow-up: No Follow-up on file.   Boykin Nearing MD

## 2015-08-01 ENCOUNTER — Other Ambulatory Visit: Payer: Self-pay

## 2015-08-01 VITALS — BP 118/78 | HR 85 | Resp 16 | Ht 69.0 in | Wt 322.2 lb

## 2015-08-01 DIAGNOSIS — E119 Type 2 diabetes mellitus without complications: Secondary | ICD-10-CM

## 2015-08-01 DIAGNOSIS — Z794 Long term (current) use of insulin: Principal | ICD-10-CM

## 2015-08-01 NOTE — Patient Instructions (Signed)
1. Plan to eat 45-60 GM (3-4) servings of carbohydrate a meal and 15 GM for snacks.  Plan to add a non-starchy vegetable to dinner 2. Plan to check blood sugar 3-4 times either fasting or 1 -2hrs after a meal.  Goals of 80-130 fasting and 180 or less after eating. 3. Plan to walk 3 times a week for 30 minutes.  Goal of 150 minutes a week 4. Plan to complete EMMI programs by 09/24/15 5. Plan to see MD in January 6. Plan to see [facility] on 11/05/14 at [facility]

## 2015-08-01 NOTE — Patient Outreach (Signed)
[facility]) Care Management   08/01/2015  Delfino Lovett KAUSHAL VANNICE Feb 05, 1976 [id number]  Michael Foster is an 39 y.o. male  Member seen for follow up office visit for Link to Wellness program for self management of Type 2 diabetes  Subjective: Member states that he saw his doctor last month and his hemoglobin A1C was up to 9.5.  States he has not been watching his diet and he is eating too much rice at dinner.  States that he has started to take his Metformin twice a day as ordered.  States that he hurt his back last month and he stopped exercising.  States his back is better but he is not doing any walking at this time.  States his blood sugars have been higher.  States he does not eat any vegetables other than rice, corn and potatoes.  Objective:   Review of Systems  All other systems reviewed and are negative. Reviewed glucometer 14 day average-222  Physical Exam  Today's Vitals   08/01/15 1537  BP: 118/78  Pulse: 85  Resp: 16  Height: 1.753 m (_0 )  Weight: 322 lb 3.2 oz (146.149 kg)  SpO2: 96%  PainSc: 0-No pain    Current Medications:   Current Outpatient Prescriptions  Medication Sig Dispense Refill  . albuterol (PROAIR HFA) 108 (90 BASE) MCG/ACT inhaler Inhale 1-2 puffs into the lungs every 3 (three) hours as needed for wheezing or shortness of breath. 18 g 1  . Blood Glucose Monitoring Suppl (TRUE METRIX AIR GLUCOSE METER) W/DEVICE KIT 6 Units by Does not apply route 2 (two) times daily before a meal. 1 kit 0  . cyclobenzaprine (FLEXERIL) 10 MG tablet Take 1 tablet (10 mg total) by mouth 3 (three) times daily as needed for muscle spasms. 30 tablet 1  . glucose blood (TRUE METRIX BLOOD GLUCOSE TEST) test strip Use as instructed 100 each 12  . ibuprofen (ADVIL,MOTRIN) 800 MG tablet Take 1 tablet (800 mg total) by mouth every 8 (eight) hours as needed. 30 tablet 1  . insulin NPH-regular Human (NOVOLIN 70/30) (70-30) 100 UNIT/ML injection Inject 6 Units into the  skin 2 (two) times daily with a meal. (Patient taking differently: Inject 2 Units into the skin 2 (two) times daily with a meal. ) 10 mL 3  . Insulin Syringes, Disposable, U-100 0.5 ML MISC 200 200 each 10  . lisinopril (PRINIVIL,ZESTRIL) 2.5 MG tablet Take 1 tablet (2.5 mg total) by mouth daily. 30 tablet 11  . metFORMIN (GLUCOPHAGE) 500 MG tablet Take 2 tablets (1,000 mg total) by mouth 2 (two) times daily with a meal. 120 tablet 5  . metoCLOPramide (REGLAN) 10 MG tablet Take 1 tablet (10 mg total) by mouth 3 (three) times daily before meals. 60 tablet 2  . TRUEPLUS LANCETS 30G MISC As directed 100 each 5  . beclomethasone (QVAR) 40 MCG/ACT inhaler Inhale 1 puff into the lungs 2 (two) times daily. (Patient not taking: Reported on 08/01/2015) 1 Inhaler 3   No current facility-administered medications for this visit.    Functional Status:   In your present state of health, do you have any difficulty performing the following activities: 08/01/2015 07/06/2015  Hearing? N N  Vision? N N  Difficulty concentrating or making decisions? N N  Walking or climbing stairs? N N  Dressing or bathing? N N  Doing errands, shopping? N N  Preparing Food and eating ? - -  Using the Toilet? - -  In the past  six months, have you accidently leaked urine? - -  Do you have problems with loss of bowel control? - -  Managing your Medications? - -  Managing your Finances? - -  Housekeeping or managing your Housekeeping? - -    Fall/Depression Screening:    PHQ 2/9 Scores 08/01/2015 07/14/2015 06/09/2015 03/09/2015 02/07/2015 01/09/2015 01/04/2015  PHQ - 2 Score 0 0 0 0 0 0 0    Assessment:   Member seen for follow up office visit for Link to Wellness program for self management of Type 2 diabetes.  Member not meeting goal of hemoglobin A1C of 7 with last reading 9.5.  Member has not been adherent with low CHO diet and exercise.  AM CBG range 82-235 and after meal ranges 200-323. Reports taking Metformin twice a day  as ordered but varies amount of insulin he is taking because he is afraid of having hypoglycemia.  Plan:  1. Plan to eat 45-60 GM (3-4) servings of carbohydrate a meal and 15 GM for snacks.  Plan to add a non-starchy vegetable to dinner 2. Plan to check blood sugar 3-4 times either fasting or 1 -2hrs after a meal.  Goals of 80-130 fasting and 180 or less after eating. 3. Plan to walk 3 times a week for 30 minutes.  Goal of 150 minutes a week 4. Plan to complete EMMI programs by 09/24/15 5. Plan to see MD in January 6. Plan to see Link to Wellness on 11/05/14 at [place] Problem One        Most Recent Value   Care Plan Problem One  Elevated blood sugars as evidenced by hemoglobin A1C of 10.9 related to dx of Type 2 DM   Role Documenting the Problem One  Care Management Coordinator   Care Plan for Problem One  Active   [facility] Long Term Goal (31-90 days)  Member will demonstrate lower blood sugars as evidenced by decrease of hemoglobin A1C to 8 in the next 90 days   [facility] Long Term Goal Start Date  08/01/15 [Continues to have increased hemoglobin A1C of 9.5]   Interventions for Problem One Long Term Goal  Reinforced CHO counting and portion sizes, Given handout on Making a Balanced plate, Encouraged to eat non-starchy vegetables, Reinforced to be aware of the portion size of rice he eats with his meals, Reinforced s/s of hypoglycemia and actions to take,  Reinforced to  always eat  when he takes his insulin, Encouraged to take dose of insulin that MD has ordered for him to take, Reassigned EMMI programs and reviewed how to access them, Reinforced on importance of regular exercise for glycemic control    Peter Garter RN, [facility] Care Management Coordinator-Link to [place] Management [phone]

## 2015-09-06 ENCOUNTER — Emergency Department (HOSPITAL_BASED_OUTPATIENT_CLINIC_OR_DEPARTMENT_OTHER)
Admission: EM | Admit: 2015-09-06 | Discharge: 2015-09-06 | Disposition: A | Discharge status: Home / Self Care | Payer: 59 | Attending: Emergency Medicine | Admitting: Emergency Medicine

## 2015-09-06 ENCOUNTER — Encounter (HOSPITAL_BASED_OUTPATIENT_CLINIC_OR_DEPARTMENT_OTHER): Payer: Self-pay | Admitting: *Deleted

## 2015-09-06 DIAGNOSIS — Z8673 Personal history of transient ischemic attack (TIA), and cerebral infarction without residual deficits: Secondary | ICD-10-CM | POA: Insufficient documentation

## 2015-09-06 DIAGNOSIS — J45909 Unspecified asthma, uncomplicated: Secondary | ICD-10-CM | POA: Insufficient documentation

## 2015-09-06 DIAGNOSIS — Z87891 Personal history of nicotine dependence: Secondary | ICD-10-CM | POA: Insufficient documentation

## 2015-09-06 DIAGNOSIS — Z79899 Other long term (current) drug therapy: Secondary | ICD-10-CM | POA: Diagnosis not present

## 2015-09-06 DIAGNOSIS — Z8719 Personal history of other diseases of the digestive system: Secondary | ICD-10-CM | POA: Insufficient documentation

## 2015-09-06 DIAGNOSIS — I10 Essential (primary) hypertension: Secondary | ICD-10-CM | POA: Insufficient documentation

## 2015-09-06 DIAGNOSIS — K921 Melena: Secondary | ICD-10-CM

## 2015-09-06 DIAGNOSIS — E119 Type 2 diabetes mellitus without complications: Secondary | ICD-10-CM | POA: Diagnosis not present

## 2015-09-06 DIAGNOSIS — Z7984 Long term (current) use of oral hypoglycemic drugs: Secondary | ICD-10-CM | POA: Diagnosis not present

## 2015-09-06 DIAGNOSIS — Z794 Long term (current) use of insulin: Secondary | ICD-10-CM | POA: Insufficient documentation

## 2015-09-06 DIAGNOSIS — R195 Other fecal abnormalities: Secondary | ICD-10-CM | POA: Insufficient documentation

## 2015-09-06 HISTORY — DX: Noninfective gastroenteritis and colitis, unspecified: K52.9

## 2015-09-06 LAB — OCCULT BLOOD X 1 CARD TO LAB, STOOL: Fecal Occult Bld: POSITIVE — AB

## 2015-09-06 NOTE — ED Provider Notes (Signed)
CSN: [id number]     Arrival date & time 09/06/15  3300 History   First MD Initiated Contact with Patient 09/06/15 [phone]     No chief complaint on file.    (Consider location/radiation/quality/duration/timing/severity/associated sxs/prior Treatment) Patient is a 39 y.o. male presenting with GI illness. The history is provided by the patient.  GI Problem This is a new problem. The current episode started 2 days ago. The problem occurs constantly. The problem has not changed since onset.Pertinent negatives include no abdominal pain. Nothing aggravates the symptoms. Nothing relieves the symptoms. He has tried nothing for the symptoms.    Past Medical History  Diagnosis Date  . Vertigo   . Asthma     as a child  . Diabetes mellitus Dx 2009  . Hypertension Dx 2009  . H/O TIA (transient ischemic attack) and stroke 01/04/2015    06/2014 with L hand numbness   . Gastroenteritis    Past Surgical History  Procedure Laterality Date  . Mouth surgery     Family History  Problem Relation Age of Onset  . COPD Father   . Hypertension Father   . Heart failure Father   . Diabetes Father   . Sleep apnea Brother   . Hypertension Brother   . Diabetes Mother   . Hyperlipidemia Mother    Social History  Substance Use Topics  . Smoking status: Former Smoker -- 0.30 packs/day for 10 years    Types: Cigarettes    Quit date: 05/28/2013  . Smokeless tobacco: Never Used  . Alcohol Use: No    Review of Systems  Gastrointestinal: Negative for abdominal pain.  All other systems reviewed and are negative.     Allergies  Slo-bid gyrocaps  Home Medications   Prior to Admission medications   Medication Sig Start Date End Date Taking? Authorizing Provider  albuterol (PROAIR HFA) 108 (90 BASE) MCG/ACT inhaler Inhale 1-2 puffs into the lungs every 3 (three) hours as needed for wheezing or shortness of breath. 02/08/15  Yes Josalyn Funches, MD  Blood Glucose Monitoring Suppl (TRUE METRIX AIR  GLUCOSE METER) W/DEVICE KIT 6 Units by Does not apply route 2 (two) times daily before a meal. 04/14/15  Yes Josalyn Funches, MD  cyclobenzaprine (FLEXERIL) 10 MG tablet Take 1 tablet (10 mg total) by mouth 3 (three) times daily as needed for muscle spasms. 07/14/15  Yes Josalyn Funches, MD  glucose blood (TRUE METRIX BLOOD GLUCOSE TEST) test strip Use as instructed 04/14/15  Yes Josalyn Funches, MD  ibuprofen (ADVIL,MOTRIN) 800 MG tablet Take 1 tablet (800 mg total) by mouth every 8 (eight) hours as needed. 07/14/15  Yes Josalyn Funches, MD  insulin NPH-regular Human (NOVOLIN 70/30) (70-30) 100 UNIT/ML injection Inject 6 Units into the skin 2 (two) times daily with a meal. Patient taking differently: Inject 2 Units into the skin 2 (two) times daily with a meal.  04/14/15  Yes Josalyn Funches, MD  Insulin Syringes, Disposable, U-100 0.5 ML MISC 200 02/08/15  Yes Josalyn Funches, MD  lisinopril (PRINIVIL,ZESTRIL) 2.5 MG tablet Take 1 tablet (2.5 mg total) by mouth daily. 01/04/15  Yes Boykin Nearing, MD  metFORMIN (GLUCOPHAGE) 500 MG tablet Take 2 tablets (1,000 mg total) by mouth 2 (two) times daily with a meal. 03/09/15  Yes Josalyn Funches, MD  metoCLOPramide (REGLAN) 10 MG tablet Take 1 tablet (10 mg total) by mouth 3 (three) times daily before meals. 07/14/15  Yes Boykin Nearing, MD  TRUEPLUS LANCETS 30G MISC As directed 04/14/15  Yes Josalyn Funches, MD   BP 159/86 mmHg  Pulse 80  Temp(Src) 98.5 F (36.9 C) (Oral)  Resp 18  Ht '5\' 9"'  (1.753 m)  Wt 308 lb (139.708 kg)  BMI 45.46 kg/m2  SpO2 95% Physical Exam  Constitutional: He is oriented to person, place, and time. He appears well-developed and well-nourished. No distress.  HENT:  Head: Normocephalic and atraumatic.  Eyes: Conjunctivae are normal.  Neck: Neck supple. No tracheal deviation present.  Cardiovascular: Normal rate and regular rhythm.   Pulmonary/Chest: Effort normal. No respiratory distress.  Abdominal: Soft. He exhibits no  distension. There is no tenderness.  Genitourinary: Rectal exam shows no external hemorrhoid, no internal hemorrhoid, no fissure and no tenderness. Guaiac positive stool.  Neurological: He is alert and oriented to person, place, and time.  Skin: Skin is warm and dry.  Psychiatric: He has a normal mood and affect.    ED Course  Procedures (including critical care time) Labs Review Labs Reviewed - No data to display  Imaging Review No results found. I have personally reviewed and evaluated these images and lab results as part of my medical decision-making.   EKG Interpretation None      MDM   Final diagnoses:  Blood in stool    39 year old male presents with small amount of bright red blood in his stools. The blood appears to be mixed in with his stool. He has no symptoms of anemia currently. Vital signs are stable. Has primary care follow-up available. Guaiac positive confirming presence of blood but no indication for further emergent testing. Return precautions were discussed for increased volume of bleeding, abdominal pain, sinus symptoms of anemia or other concerning symptoms.    Leo Grosser, MD 09/06/15 [phone]

## 2015-09-06 NOTE — ED Notes (Signed)
MD at bedside. 

## 2015-09-06 NOTE — ED Notes (Signed)
C/o bloody stool since Monday. C/o upper right abd pain prior to having bloody stool. Nausea, no vomiting.

## 2015-09-06 NOTE — Discharge Instructions (Signed)

## 2015-09-28 DIAGNOSIS — Z6841 Body Mass Index (BMI) 40.0 and over, adult: Secondary | ICD-10-CM | POA: Diagnosis not present

## 2015-09-28 DIAGNOSIS — K219 Gastro-esophageal reflux disease without esophagitis: Secondary | ICD-10-CM | POA: Diagnosis not present

## 2015-09-28 DIAGNOSIS — J45909 Unspecified asthma, uncomplicated: Secondary | ICD-10-CM | POA: Diagnosis not present

## 2015-09-28 DIAGNOSIS — R809 Proteinuria, unspecified: Secondary | ICD-10-CM | POA: Diagnosis not present

## 2015-09-28 DIAGNOSIS — K921 Melena: Secondary | ICD-10-CM | POA: Diagnosis not present

## 2015-09-28 DIAGNOSIS — E1129 Type 2 diabetes mellitus with other diabetic kidney complication: Secondary | ICD-10-CM | POA: Diagnosis not present

## 2015-09-28 DIAGNOSIS — I10 Essential (primary) hypertension: Secondary | ICD-10-CM | POA: Diagnosis not present

## 2015-09-28 DIAGNOSIS — F419 Anxiety disorder, unspecified: Secondary | ICD-10-CM | POA: Diagnosis not present

## 2015-09-28 DIAGNOSIS — E1165 Type 2 diabetes mellitus with hyperglycemia: Secondary | ICD-10-CM | POA: Diagnosis not present

## 2015-09-29 MED FILL — METFORMIN HCL ER 500 MG TAB: 500 | 90 days supply | Qty: 180 | Fill #0

## 2015-10-20 MED FILL — NOVOLIN 70/30 100 UNITS/ML: (70-30) 100 | 90 days supply | Qty: 30 | Fill #3

## 2015-10-20 MED FILL — TRUEplus LANCETS 30G MISC: 30 days supply | Qty: 100 | Fill #3

## 2015-10-20 MED FILL — ULTICARE INS SYR 1 ML 30GX1: 30G X 1/2" | 90 days supply | Qty: 200 | Fill #3

## 2015-10-20 MED FILL — LISINOPRIL 2.5 MG TABLET: 2.5 | 90 days supply | Qty: 90 | Fill #3

## 2015-10-20 MED FILL — TRUE METRIX GLUCOSE TEST ST: 30 days supply | Qty: 100 | Fill #3

## 2015-10-21 DIAGNOSIS — F4321 Adjustment disorder with depressed mood: Secondary | ICD-10-CM | POA: Diagnosis not present

## 2015-11-03 DIAGNOSIS — F4321 Adjustment disorder with depressed mood: Secondary | ICD-10-CM | POA: Diagnosis not present

## 2015-11-06 ENCOUNTER — Ambulatory Visit: Payer: Self-pay

## 2015-11-10 DIAGNOSIS — Z794 Long term (current) use of insulin: Secondary | ICD-10-CM | POA: Diagnosis not present

## 2015-11-10 DIAGNOSIS — Z7984 Long term (current) use of oral hypoglycemic drugs: Secondary | ICD-10-CM | POA: Diagnosis not present

## 2015-11-10 DIAGNOSIS — L84 Corns and callosities: Secondary | ICD-10-CM | POA: Diagnosis not present

## 2015-11-10 DIAGNOSIS — E1165 Type 2 diabetes mellitus with hyperglycemia: Secondary | ICD-10-CM | POA: Diagnosis not present

## 2015-11-29 ENCOUNTER — Other Ambulatory Visit (HOSPITAL_COMMUNITY): Payer: Self-pay | Admitting: Family Medicine

## 2015-11-29 DIAGNOSIS — G4482 Headache associated with sexual activity: Secondary | ICD-10-CM | POA: Diagnosis not present

## 2015-11-29 MED FILL — INDOMETHACIN 50 MG CAPSULE: 50 | 30 days supply | Qty: 30 | Fill #0

## 2015-12-01 ENCOUNTER — Other Ambulatory Visit: Payer: Self-pay | Admitting: Family Medicine

## 2015-12-01 MED FILL — VENTOLIN HFA 90 MCG INHALER: 108 (90 BAS | 35 days supply | Qty: 36 | Fill #0

## 2015-12-12 ENCOUNTER — Ambulatory Visit (HOSPITAL_COMMUNITY)
Admission: RE | Admit: 2015-12-12 | Discharge: 2015-12-12 | Disposition: A | Discharge status: Home / Self Care | Payer: 59 | Source: Ambulatory Visit | Attending: Family Medicine | Admitting: Family Medicine

## 2015-12-12 DIAGNOSIS — G4482 Headache associated with sexual activity: Secondary | ICD-10-CM | POA: Diagnosis not present

## 2015-12-12 DIAGNOSIS — J341 Cyst and mucocele of nose and nasal sinus: Secondary | ICD-10-CM | POA: Insufficient documentation

## 2015-12-12 DIAGNOSIS — R51 Headache: Secondary | ICD-10-CM | POA: Diagnosis not present

## 2015-12-12 LAB — POCT I-STAT CREATININE: CREATININE: 0.9 mg/dL (ref 0.61–1.24)

## 2015-12-12 MED ORDER — GADOBENATE DIMEGLUMINE 529 MG/ML IV SOLN
20.0000 mL | Freq: Once | INTRAVENOUS | Status: AC | PRN
  Administered 2015-12-12: 20 mL via INTRAVENOUS

## 2016-01-03 MED FILL — ULTICARE INS SYR 1 ML 30GX1: 30G X 1/2" | 90 days supply | Qty: 200 | Fill #4

## 2016-01-03 MED FILL — METFORMIN HCL ER 500 MG TAB: 500 | 90 days supply | Qty: 180 | Fill #1

## 2016-01-03 MED FILL — LISINOPRIL 2.5 MG TABLET: 2.5 | 90 days supply | Qty: 90 | Fill #0

## 2016-01-03 MED FILL — TRUE METRIX GLUCOSE TEST ST: 30 days supply | Qty: 100 | Fill #4

## 2016-01-03 MED FILL — NOVOLIN 70/30 100 UNITS/ML: (70-30) 100 | 84 days supply | Qty: 30 | Fill #0

## 2016-01-03 MED FILL — TRUEplus LANCETS 30G MISC: 30 days supply | Qty: 100 | Fill #4

## 2016-01-04 ENCOUNTER — Other Ambulatory Visit: Payer: Self-pay

## 2016-01-04 VITALS — BP 130/74 | HR 82 | Resp 16 | Ht 69.0 in | Wt 327.2 lb

## 2016-01-04 DIAGNOSIS — Z794 Long term (current) use of insulin: Principal | ICD-10-CM

## 2016-01-04 DIAGNOSIS — E119 Type 2 diabetes mellitus without complications: Secondary | ICD-10-CM

## 2016-01-04 NOTE — Patient Outreach (Addendum)
Michael Foster) Care Management   01/04/2016  Michael Foster [birth year] [id number]  Michael Foster is an 40 y.o. male.   Member seen for follow up office visit for Link to Wellness program for self management of Type 2 diabetes  Subjective: Member states he is in the [employer] which is helping him with checking his blood sugars and activity.  States that he did have a blood sugar or 390 a day or so ago when he ate 2 bags of gummi bears.  States that he brought the sugar down by walking.  States he had already taken his insulin that morning.  States he is still not taking his PM insulin regularly as he is afraid he will go low.  States he is trying to eat less rice and more vegetables at dinner.  States he does like to snack at night on chips or a sandwich.  States his blood sugars are usually higher in the AM.  States he is to see his MD next week and he is to have labs drawn.  States he had been walking regularly but stopped about a month ago because of his allergies.    Objective:   Review of Systems  All other systems reviewed and are negative.   Physical Exam Today's Vitals   01/04/16 1621  BP: 130/74  Pulse: 82  Resp: 16  Height: 1.753 m (5' 9")  Weight: 327 lb 3.2 oz (148.417 kg)  SpO2: 95%  PainSc: 0-No pain   Encounter Medications:   Outpatient Encounter Prescriptions as of 01/04/2016  Medication Sig Note  . Blood Glucose Monitoring Suppl (TRUE METRIX AIR GLUCOSE METER) W/DEVICE KIT 6 Units by Does not apply route 2 (two) times daily before a meal.   . Calcium Carbonate Antacid (ALKA-SELTZER ANTACID PO) Take 1 tablet by mouth daily as needed.   . cetirizine (ZYRTEC) 10 MG tablet Take 10 mg by mouth daily as needed for allergies.   . cyclobenzaprine (FLEXERIL) 10 MG tablet Take 1 tablet (10 mg total) by mouth 3 (three) times daily as needed for muscle spasms.   Marland Kitchen glucose blood (TRUE METRIX BLOOD GLUCOSE TEST) test strip Use as instructed   . ibuprofen  (ADVIL,MOTRIN) 800 MG tablet Take 1 tablet (800 mg total) by mouth every 8 (eight) hours as needed.   Marland Kitchen lisinopril (PRINIVIL,ZESTRIL) 2.5 MG tablet Take 1 tablet (2.5 mg total) by mouth daily.   . metFORMIN (GLUCOPHAGE-XR) 500 MG 24 hr tablet Take 1,000 mg by mouth daily with breakfast.   . VENTOLIN HFA 108 (90 Base) MCG/ACT inhaler INHALE 1-2 PUFFS INTO THE LUNGS EVERY 3 HOURS AS NEEDED FOR WHEEZING OR SHORTNESS OF BREATH.   . insulin NPH-regular Human (NOVOLIN 70/30) (70-30) 100 UNIT/ML injection Inject 6 Units into the skin 2 (two) times daily with a meal. (Patient taking differently: Inject 4 Units into the skin 2 (two) times daily with a meal. ) 08/01/2015: Taking 4 units twice a day before meals  . Insulin Syringes, Disposable, U-100 0.5 ML MISC 200   . metFORMIN (GLUCOPHAGE) 500 MG tablet Take 2 tablets (1,000 mg total) by mouth 2 (two) times daily with a meal. (Patient not taking: Reported on 01/04/2016)   . metoCLOPramide (REGLAN) 10 MG tablet Take 1 tablet (10 mg total) by mouth 3 (three) times daily before meals. (Patient not taking: Reported on 01/04/2016)   . TRUEPLUS LANCETS 30G MISC As directed    No facility-administered encounter medications on file as of 01/04/2016.  Functional Status:   In your present state of health, do you have any difficulty performing the following activities: 08/01/2015 07/06/2015  Hearing? N N  Vision? N N  Difficulty concentrating or making decisions? N N  Walking or climbing stairs? N N  Dressing or bathing? N N  Doing errands, shopping? N N  Preparing Food and eating ? - -  Using the Toilet? - -  In the past six months, have you accidently leaked urine? - -  Do you have problems with loss of bowel control? - -  Managing your Medications? - -  Managing your Finances? - -  Housekeeping or managing your Housekeeping? - -    Fall/Depression Screening:    PHQ 2/9 Scores 01/04/2016 08/01/2015 07/14/2015 06/09/2015 03/09/2015 02/07/2015 01/09/2015  PHQ -  2 Score 0 0 0 0 0 0 0    Assessment:  Member seen for follow up office visit for Link to Wellness program for self management of Type 2 diabetes. Member not meeting goal of hemoglobin A1C of 7 with last reading 9.5. Member has not been adherent with low CHO diet and exercise. AM CBG range 160-390 and after meal ranges 89-200. Reports taking Metformin twice a day as ordered but varies amount of insulin he is taking because he is afraid of having hypoglycemia. Member is in the [employer].  Member is up to date with annual eye exam and dental check ups.  Member is to see provider on 01/10/16.  Member might benefit from a change to a basal insulin.  Plan:  Plan to eat 45-60 GM (3-4) servings of carbohydrate a meal and 15 GM for snacks.  Plan to add a non-starchy vegetable to dinner Plan to check blood sugar 3-4 times either fasting or 1 -2hrs after a meal.  Goals of 80-130 fasting and 180 or less after eating. Plan to walk 6000-8000 steps a day.  Goal of 150 minutes a week Try to take evening insulin Plan to complete EMMI by 7/1 Plan to see MD on 01/10/16 Plan to see Link to Wellness on 04/11/16 at [place] [facility]        Most Recent Value   Care Plan Problem One  Elevated blood sugars as evidenced by hemoglobin A1C of 10.9 related to dx of Type 2 DM   Role Documenting the Problem One  Care Management Coordinator   Care Plan for Problem One  Active   [facility] Long Term Goal (31-90 days)  Member will demonstrate lower blood sugars as evidenced by decrease of hemoglobin A1C to 8 in the next 90 days   [facility] Long Term Goal Start Date  01/04/16 [ increased hemoglobin A1C of 9.5 recheck 01/10/16 scheduled]   Interventions for Problem One Long Term Goal  Reinforced CHO counting and portion sizes, Praised for cutting back on portins of rice and eating more vegetables,  Encouraged to eat non-starchy vegetables, Encouraged to avoid gummi bears and to try eating protein with his snacks,  Reinforced to  always eat  when he takes his insulin, Encouraged to take dose of insulin that MD has ordered for him to take, Encouraged to show MD list of free medications and to discuss with him about changing to a basal insulin,  Reassigned EMMI programs and reviewed how to access them, Reinforced on importance of regular exercise for glycemic control    Peter Garter RN, [facility] Care Management Coordinator-Link to [place] Management [phone]

## 2016-01-05 NOTE — Patient Instructions (Signed)
1. Plan to eat 45-60 GM (3-4) servings of carbohydrate a meal and 15 GM for snacks.  Plan to add a non-starchy vegetable to dinner 2. Plan to check blood sugar 3-4 times either fasting or 1 -2hrs after a meal.  Goals of 80-130 fasting and 180 or less after eating. 3. Plan to walk 6000-8000 steps a day.  Goal of 150 minutes a week 4. Try to take evening insulin 5. Plan to complete EMMI by 7/1 6. Plan to see MD on 01/10/16 7. Plan to see Link to Wellness on 04/11/16 at [facility]PM

## 2016-01-10 DIAGNOSIS — Z7984 Long term (current) use of oral hypoglycemic drugs: Secondary | ICD-10-CM | POA: Diagnosis not present

## 2016-01-10 DIAGNOSIS — E1165 Type 2 diabetes mellitus with hyperglycemia: Secondary | ICD-10-CM | POA: Diagnosis not present

## 2016-01-10 DIAGNOSIS — I10 Essential (primary) hypertension: Secondary | ICD-10-CM | POA: Diagnosis not present

## 2016-03-07 MED FILL — VENTOLIN HFA 90 MCG INHALER: 108 (90 BAS | 90 days supply | Qty: 54 | Fill #0

## 2016-03-09 DIAGNOSIS — F4321 Adjustment disorder with depressed mood: Secondary | ICD-10-CM | POA: Diagnosis not present

## 2016-03-13 ENCOUNTER — Emergency Department (HOSPITAL_BASED_OUTPATIENT_CLINIC_OR_DEPARTMENT_OTHER)
Admission: EM | Admit: 2016-03-13 | Discharge: 2016-03-13 | Disposition: A | Discharge status: Home / Self Care | Payer: 59 | Attending: Emergency Medicine | Admitting: Emergency Medicine

## 2016-03-13 ENCOUNTER — Encounter (HOSPITAL_BASED_OUTPATIENT_CLINIC_OR_DEPARTMENT_OTHER): Payer: Self-pay

## 2016-03-13 DIAGNOSIS — I10 Essential (primary) hypertension: Secondary | ICD-10-CM | POA: Insufficient documentation

## 2016-03-13 DIAGNOSIS — Z7984 Long term (current) use of oral hypoglycemic drugs: Secondary | ICD-10-CM | POA: Insufficient documentation

## 2016-03-13 DIAGNOSIS — Z87891 Personal history of nicotine dependence: Secondary | ICD-10-CM | POA: Insufficient documentation

## 2016-03-13 DIAGNOSIS — Z79899 Other long term (current) drug therapy: Secondary | ICD-10-CM | POA: Diagnosis not present

## 2016-03-13 DIAGNOSIS — K625 Hemorrhage of anus and rectum: Secondary | ICD-10-CM | POA: Diagnosis not present

## 2016-03-13 DIAGNOSIS — E119 Type 2 diabetes mellitus without complications: Secondary | ICD-10-CM | POA: Insufficient documentation

## 2016-03-13 DIAGNOSIS — Z794 Long term (current) use of insulin: Secondary | ICD-10-CM | POA: Diagnosis not present

## 2016-03-13 DIAGNOSIS — J45909 Unspecified asthma, uncomplicated: Secondary | ICD-10-CM | POA: Diagnosis not present

## 2016-03-13 DIAGNOSIS — K921 Melena: Secondary | ICD-10-CM | POA: Diagnosis present

## 2016-03-13 LAB — OCCULT BLOOD X 1 CARD TO LAB, STOOL: Fecal Occult Bld: POSITIVE — AB

## 2016-03-13 LAB — CBG MONITORING, ED: Glucose-Capillary: 235 mg/dL — ABNORMAL HIGH (ref 65–99)

## 2016-03-13 LAB — CBC
HEMATOCRIT: 45.1 % (ref 39.0–52.0)
HEMOGLOBIN: 15 g/dL (ref 13.0–17.0)
MCH: 26.8 pg (ref 26.0–34.0)
MCHC: 33.3 g/dL (ref 30.0–36.0)
MCV: 80.7 fL (ref 78.0–100.0)
Platelets: 351 10*3/uL (ref 150–400)
RBC: 5.59 MIL/uL (ref 4.22–5.81)
RDW: 13.8 % (ref 11.5–15.5)
WBC: 8.8 10*3/uL (ref 4.0–10.5)

## 2016-03-13 MED ORDER — DOCUSATE SODIUM 100 MG PO CAPS: 100.0000 mg | ORAL_CAPSULE | Freq: Two times a day (BID) | ORAL | Status: DC

## 2016-03-13 NOTE — Discharge Instructions (Signed)
Gastrointestinal Bleeding °Gastrointestinal bleeding is bleeding somewhere along the path that food travels through the body (digestive tract). This path is anywhere between the mouth and the opening of the butt (anus). You may have blood in your throw up (vomit) or in your poop (stools). If there is a lot of bleeding, you may need to stay in the hospital. °HOME CARE °· Only take medicine as told by your doctor. °· Eat foods with fiber such as whole grains, fruits, and vegetables. You can also try eating 1 to 3 prunes a day. °· Drink enough fluids to keep your pee (urine) clear or pale yellow. °GET HELP RIGHT AWAY IF:  °· Your bleeding gets worse. °· You feel dizzy, weak, or you pass out (faint). °· You have bad cramps in your back or belly (abdomen). °· You have large blood clumps (clots) in your poop. °· Your problems are getting worse. °MAKE SURE YOU:  °· Understand these instructions. °· Will watch your condition. °· Will get help right away if you are not doing well or get worse. °  °This information is not intended to replace advice given to you by your health care provider. Make sure you discuss any questions you have with your health care provider. °  °Document Released: 06/18/2008 Document Revised: 08/26/2012 Document Reviewed: 02/27/2015 °Elsevier Interactive Patient Education ©2016 Elsevier Inc[employer] ° °

## 2016-03-13 NOTE — ED Provider Notes (Signed)
CSN: [id number]     Arrival date & time 03/13/16  0720 History   First MD Initiated Contact with Patient 03/13/16 [phone]     Chief Complaint  Patient presents with  . bloody stools     Patient is a 40 y.o. male presenting with hematochezia. The history is provided by the patient.  Rectal Bleeding Quality:  Bright red Amount:  Moderate Duration:  1 day Timing:  Intermittent Progression:  Unchanged Chronicity:  New Similar prior episodes: yes   Relieved by:  Nothing Worsened by:  Defecation Associated symptoms: light-headedness   Associated symptoms: no abdominal pain, no fever, no hematemesis, no loss of consciousness and no vomiting   Risk factors: no anticoagulant use and no liver disease   Patient reports he began having hard stools recently and over the past day he has had blood mixed in stool No dark stools No vomiting No abd pain He reports nausea He feels lightheaded No anticoagulants He occasionally uses alka seltzer with ASA No ETOH abuse PCP told him if this happens again he needs GI referral  Past Medical History  Diagnosis Date  . Vertigo   . Asthma     as a child  . Diabetes mellitus Dx 2009  . Hypertension Dx 2009  . H/O TIA (transient ischemic attack) and stroke 01/04/2015    06/2014 with L hand numbness   . Gastroenteritis    Past Surgical History  Procedure Laterality Date  . Mouth surgery     Family History  Problem Relation Age of Onset  . COPD Father   . Hypertension Father   . Heart failure Father   . Diabetes Father   . Sleep apnea Brother   . Hypertension Brother   . Diabetes Mother   . Hyperlipidemia Mother    Social History  Substance Use Topics  . Smoking status: Former Smoker -- 0.30 packs/day for 10 years    Types: Cigarettes    Quit date: 05/28/2013  . Smokeless tobacco: Never Used  . Alcohol Use: No    Review of Systems  Constitutional: Negative for fever.  Gastrointestinal: Positive for blood in stool and hematochezia.  Negative for vomiting, abdominal pain and hematemesis.  Neurological: Positive for light-headedness. Negative for loss of consciousness.  All other systems reviewed and are negative.     Allergies  Slo-bid gyrocaps  Home Medications   Prior to Admission medications   Medication Sig Start Date End Date Taking? Authorizing Provider  Blood Glucose Monitoring Suppl (TRUE METRIX AIR GLUCOSE METER) W/DEVICE KIT 6 Units by Does not apply route 2 (two) times daily before a meal. 04/14/15   Boykin Nearing, MD  Calcium Carbonate Antacid (ALKA-SELTZER ANTACID PO) Take 1 tablet by mouth daily as needed.    Historical Provider, MD  cetirizine (ZYRTEC) 10 MG tablet Take 10 mg by mouth daily as needed for allergies.    Historical Provider, MD  cyclobenzaprine (FLEXERIL) 10 MG tablet Take 1 tablet (10 mg total) by mouth 3 (three) times daily as needed for muscle spasms. 07/14/15   Josalyn Funches, MD  glucose blood (TRUE METRIX BLOOD GLUCOSE TEST) test strip Use as instructed 04/14/15   Boykin Nearing, MD  ibuprofen (ADVIL,MOTRIN) 800 MG tablet Take 1 tablet (800 mg total) by mouth every 8 (eight) hours as needed. 07/14/15   Josalyn Funches, MD  insulin NPH-regular Human (NOVOLIN 70/30) (70-30) 100 UNIT/ML injection Inject 6 Units into the skin 2 (two) times daily with a meal. Patient taking differently: Inject  4 Units into the skin 2 (two) times daily with a meal.  04/14/15   Boykin Nearing, MD  Insulin Syringes, Disposable, U-100 0.5 ML MISC 200 02/08/15   Josalyn Funches, MD  lisinopril (PRINIVIL,ZESTRIL) 2.5 MG tablet Take 1 tablet (2.5 mg total) by mouth daily. 01/04/15   Boykin Nearing, MD  metFORMIN (GLUCOPHAGE) 500 MG tablet Take 2 tablets (1,000 mg total) by mouth 2 (two) times daily with a meal. Patient not taking: Reported on 01/04/2016 03/09/15   Boykin Nearing, MD  metFORMIN (GLUCOPHAGE-XR) 500 MG 24 hr tablet Take 1,000 mg by mouth daily with breakfast.    Historical Provider, MD   metoCLOPramide (REGLAN) 10 MG tablet Take 1 tablet (10 mg total) by mouth 3 (three) times daily before meals. Patient not taking: Reported on 01/04/2016 07/14/15   Boykin Nearing, MD  TRUEPLUS LANCETS 30G MISC As directed 04/14/15   Boykin Nearing, MD  VENTOLIN HFA 108 (90 Base) MCG/ACT inhaler INHALE 1-2 PUFFS INTO THE LUNGS EVERY 3 HOURS AS NEEDED FOR WHEEZING OR SHORTNESS OF BREATH. 12/01/15   Josalyn Funches, MD   BP 130/82 mmHg  Pulse 93  Temp(Src) 99.2 F (37.3 C) (Oral)  Resp 20  Ht _0  (1.753 m)  Wt 143.337 kg  BMI 46.64 kg/m2  SpO2 97% Physical Exam CONSTITUTIONAL: Well developed/well nourished HEAD: Normocephalic/atraumatic EYES: EOMI/PERRL ENMT: Mucous membranes moist NECK: supple no meningeal signs CV: S1/S2 noted, no murmurs/rubs/gallops noted LUNGS: Lungs are clear to auscultation bilaterally, no apparent distress ABDOMEN: soft, nontender, no rebound or guarding, bowel sounds noted throughout abdomen GU:no cva tenderness RECTAL - chaperone nurse Jamie present.  Stool whitish brown.  No blood or melena.  No mass noted Hemoccult positive NEURO: Pt is awake/alert/appropriate, moves all extremitiesx4.  No facial droop.   EXTREMITIES: pulses normal/equal, full ROM SKIN: warm, color normal PSYCH: no abnormalities of mood noted, alert and oriented to situation  ED Course  Procedures  Labs Review Labs Reviewed  OCCULT BLOOD X 1 CARD TO LAB, STOOL - Abnormal; Notable for the following:    Fecal Occult Bld POSITIVE (*)    All other components within normal limits  CBG MONITORING, ED - Abnormal; Notable for the following:    Glucose-Capillary 235 (*)    All other components within normal limits  CBC    I have personally reviewed and evaluated these lab results as part of my medical decision-making.   Pt with rectal bleeding after recent hard stool Will place on stool softeners Referred to GI Advised to stop NSAIDs I doubt acute/emergent GI bleed at this  time    MDM   Final diagnoses:  Rectal bleeding    Nursing notes including past medical history and social history reviewed and considered in documentation. Labs/vital reviewed myself and considered during evaluation Previous records reviewed and considered     Ripley Fraise, MD 03/13/16 [phone]

## 2016-03-13 NOTE — ED Notes (Signed)
Patient complains of noticing dark red blood in stool x 4. States that the bleeding started after having a very hard stool 2 days ago. This am developed nausea and diaphoresis with some light headedness. Has not eaten this am pta. Denies abdominal pain

## 2016-03-13 NOTE — ED Notes (Signed)
MD at bedside. 

## 2016-04-04 DIAGNOSIS — F4321 Adjustment disorder with depressed mood: Secondary | ICD-10-CM | POA: Diagnosis not present

## 2016-04-05 MED FILL — NOVOLIN 70/30 100 UNITS/ML: (70-30) 100 | 84 days supply | Qty: 30 | Fill #1

## 2016-04-05 MED FILL — LISINOPRIL 2.5 MG TABLET: 2.5 | 90 days supply | Qty: 90 | Fill #1

## 2016-04-05 MED FILL — METFORMIN HCL ER 500 MG TAB: 500 | 90 days supply | Qty: 180 | Fill #2

## 2016-04-09 MED FILL — ULTICARE INS SYR 1 ML 30GX1: 30G X 1/2" | 90 days supply | Qty: 200 | Fill #0

## 2016-04-11 ENCOUNTER — Other Ambulatory Visit: Payer: Self-pay

## 2016-04-11 VITALS — BP 136/82 | HR 74 | Resp 16 | Ht 69.0 in | Wt 323.8 lb

## 2016-04-11 DIAGNOSIS — Z794 Long term (current) use of insulin: Principal | ICD-10-CM

## 2016-04-11 DIAGNOSIS — E119 Type 2 diabetes mellitus without complications: Secondary | ICD-10-CM

## 2016-04-11 NOTE — Patient Outreach (Signed)
[facility]) Care Management   04/11/2016  Michael Foster July 05, 1976 [id number]  Michael Foster is an 40 y.o. male.   Member seen for follow up office visit for Link to Wellness program for self management of Type 2 diabetes  Subjective: Member states that he has joined a gym and he had been going 3 times a week for 60 minutes until he went on call 2 weeks ago.  States that he has not been checking his blood sugars regularly and he stopped using the [employer] program about a month ago.  States his MD talked to him about starting Trulicity but he did not start taking and he is still considering it.  States that he has not been too good with watching his diet but he feels he can get back on track.  States he is getting a divorce and he has a new girl friend who wants him to be healthier.  States that he is to see his MD next week for a follow up  Objective:   Review of Systems  All other systems reviewed and are negative.   Physical Exam Today's Vitals   04/11/16 1601  BP: 136/82  Pulse: 74  Resp: 16  Height: 1.753 m ('5\' 9"' )  Weight: 323 lb 12.8 oz (146.875 kg)  SpO2: 99%  PainSc: 0-No pain   Encounter Medications:   Outpatient Encounter Prescriptions as of 04/11/2016  Medication Sig Note  . Blood Glucose Monitoring Suppl (TRUE METRIX AIR GLUCOSE METER) W/DEVICE KIT 6 Units by Does not apply route 2 (two) times daily before a meal.   . Calcium Carbonate Antacid (ALKA-SELTZER ANTACID PO) Take 1 tablet by mouth daily as needed.   . cetirizine (ZYRTEC) 10 MG tablet Take 10 mg by mouth daily as needed for allergies.   . cyclobenzaprine (FLEXERIL) 10 MG tablet Take 1 tablet (10 mg total) by mouth 3 (three) times daily as needed for muscle spasms.   Marland Kitchen docusate sodium (COLACE) 100 MG capsule Take 1 capsule (100 mg total) by mouth every 12 (twelve) hours.   Marland Kitchen glucose blood (TRUE METRIX BLOOD GLUCOSE TEST) test strip Use as instructed   . insulin NPH-regular Human (NOVOLIN  70/30) (70-30) 100 UNIT/ML injection Inject 6 Units into the skin 2 (two) times daily with a meal. (Patient taking differently: Inject 4 Units into the skin 2 (two) times daily with a meal. ) 08/01/2015: Taking 4 units twice a day before meals  . Insulin Syringes, Disposable, U-100 0.5 ML MISC 200   . lisinopril (PRINIVIL,ZESTRIL) 2.5 MG tablet Take 1 tablet (2.5 mg total) by mouth daily.   . metFORMIN (GLUCOPHAGE-XR) 500 MG 24 hr tablet Take 1,000 mg by mouth daily with breakfast.   . TRUEPLUS LANCETS 30G MISC As directed   . VENTOLIN HFA 108 (90 Base) MCG/ACT inhaler INHALE 1-2 PUFFS INTO THE LUNGS EVERY 3 HOURS AS NEEDED FOR WHEEZING OR SHORTNESS OF BREATH.   . metFORMIN (GLUCOPHAGE) 500 MG tablet Take 2 tablets (1,000 mg total) by mouth 2 (two) times daily with a meal. (Patient not taking: Reported on 01/04/2016)   . metoCLOPramide (REGLAN) 10 MG tablet Take 1 tablet (10 mg total) by mouth 3 (three) times daily before meals. (Patient not taking: Reported on 01/04/2016)    No facility-administered encounter medications on file as of 04/11/2016.    Functional Status:   In your present state of health, do you have any difficulty performing the following activities: 04/11/2016 08/01/2015  Hearing? N N  Vision? N N  Difficulty concentrating or making decisions? N N  Walking or climbing stairs? N N  Dressing or bathing? N N  Doing errands, shopping? N N    Fall/Depression Screening:    PHQ 2/9 Scores 04/11/2016 01/04/2016 08/01/2015 07/14/2015 06/09/2015 03/09/2015 02/07/2015  PHQ - 2 Score 0 0 0 0 0 0 0    Assessment:  Member seen for follow up office visit for Link to Wellness program for self management of Type 2 diabetes. Member not meeting goal of hemoglobin A1C of 7% with last reading 9.2%. Member has not been adherent with low CHO diet and exercise. Member has not been checking CBGs regularly Reports taking Metformin twice a day as ordered but varies amount of insulin he is taking because he  is afraid of having hypoglycemia. Member is no longer  in the [employer] Member is up to date with annual eye exam and dental check ups. Member is to see provider on 04/17/16. Member might benefit from a change to a basal insulin.  Member did not start taking Trulicity and is still considering.  Plan:  Plan to eat 45-60 GM (3-4) servings of carbohydrate a meal and 15 GM for snacks.  Plan to add a non-starchy vegetable to dinner Plan to check blood sugar 1-2 times a day either fasting or 1 -2hrs after a meal.  Goals of 80-130 fasting and 180 or less after eating. Plan to go to gym 3 times a week for 60 minutes.  Goal of 150 minutes a week Plan to try to take evening insulin Plan to complete EMMI by 06/23/16 Plan to see MD on 04/17/16 Plan to see Link to Wellness on 07/11/16 at [place] Problem [facility]        Most Recent Value   Care Plan Problem One  Elevated blood sugars as evidenced by hemoglobin A1C of 10.9 related to dx of Type 2 DM   Role Documenting the Problem One  Care Management Coordinator   Care Plan for Problem One  Active   [facility] Long Term Goal (31-90 days)  Member will demonstrate lower blood sugars as evidenced by decrease of hemoglobin A1C to 8 in the next 90 days   [facility] Long Term Goal Start Date  04/11/16   Interventions for Problem One Long Term Goal  Reinforced CHO counting and portion sizes, Reinforced to limit his portion sizes of rice and to eat more vegetables,  Encouraged to eat non-starchy vegetables, Reinforced to eat protein with his snacks, Reinforced to  always eat  when he takes his insulin,   Instructed to start taking his blood sugars at least daily and reviewed goals, Instructed on different classes of diabetes drugs and how they work, Instructed to discuss with Dr. Marisue Humble about taking a GLP-1s,  Encouraged to take dose of insulin that MD has ordered for him to take, Encouraged to show MD list of free medications and to discuss with him about changing  to a basal insulin,  Reassigned EMMI programs and reviewed how to access them, Praised for joining a gym and encouraged to work out, Reinforced on importance of regular exercise for glycemic control    Peter Garter RN, [facility] Care Management Coordinator-Link to [place] Management [phone]

## 2016-04-12 NOTE — Patient Instructions (Signed)
1. Plan to eat 45-60 GM (3-4) servings of carbohydrate a meal and 15 GM for snacks.  Plan to add a non-starchy vegetable to dinner 2. Plan to check blood sugar 1-2 times a day either fasting or 1 -2hrs after a meal.  Goals of 80-130 fasting and 180 or less after eating. 3. Plan to go to gym 3 times a week for 60 minutes.  Goal of 150 minutes a week 4. Plan to try to take evening insulin 5. Plan to complete EMMI by 06/23/16 6. Plan to see MD on 04/17/16 7. Plan to see Link to Wellness on 07/11/16 at [facility]

## 2016-04-17 DIAGNOSIS — Z113 Encounter for screening for infections with a predominantly sexual mode of transmission: Secondary | ICD-10-CM | POA: Diagnosis not present

## 2016-04-17 DIAGNOSIS — E1165 Type 2 diabetes mellitus with hyperglycemia: Secondary | ICD-10-CM | POA: Diagnosis not present

## 2016-04-17 DIAGNOSIS — I10 Essential (primary) hypertension: Secondary | ICD-10-CM | POA: Diagnosis not present

## 2016-04-17 DIAGNOSIS — Z794 Long term (current) use of insulin: Secondary | ICD-10-CM | POA: Diagnosis not present

## 2016-04-17 DIAGNOSIS — Z7984 Long term (current) use of oral hypoglycemic drugs: Secondary | ICD-10-CM | POA: Diagnosis not present

## 2016-04-17 MED FILL — VICTOZA 2-PAK 18 MG/3 ML PE: 18 | 30 days supply | Qty: 6 | Fill #0

## 2016-04-18 DIAGNOSIS — F4321 Adjustment disorder with depressed mood: Secondary | ICD-10-CM | POA: Diagnosis not present

## 2016-04-18 MED FILL — UNIFINE PENTIPS 8MM 31G: 31G X 8 MM | 90 days supply | Qty: 100 | Fill #0

## 2016-04-23 MED FILL — TRUE METRIX GLUCOSE TEST ST: 50 days supply | Qty: 100 | Fill #0

## 2016-04-23 MED FILL — TRUEplus LANCETS 30G MISC: 50 days supply | Qty: 100 | Fill #0

## 2016-05-16 DIAGNOSIS — E1165 Type 2 diabetes mellitus with hyperglycemia: Secondary | ICD-10-CM | POA: Diagnosis not present

## 2016-05-16 DIAGNOSIS — Z794 Long term (current) use of insulin: Secondary | ICD-10-CM | POA: Diagnosis not present

## 2016-05-16 DIAGNOSIS — I10 Essential (primary) hypertension: Secondary | ICD-10-CM | POA: Diagnosis not present

## 2016-05-18 DIAGNOSIS — F4321 Adjustment disorder with depressed mood: Secondary | ICD-10-CM | POA: Diagnosis not present

## 2016-05-21 MED FILL — VICTOZA 2-PAK 18 MG/3 ML PE: 18 | 30 days supply | Qty: 6 | Fill #1

## 2016-06-13 DIAGNOSIS — F4321 Adjustment disorder with depressed mood: Secondary | ICD-10-CM | POA: Diagnosis not present

## 2016-06-27 MED FILL — VICTOZA 2-PAK 18 MG/3 ML PE: 18 | 30 days supply | Qty: 6 | Fill #2

## 2016-07-03 DIAGNOSIS — F909 Attention-deficit hyperactivity disorder, unspecified type: Secondary | ICD-10-CM | POA: Diagnosis not present

## 2016-07-03 MED FILL — VYVANSE 30 MG CAPSULE: 30 | 30 days supply | Qty: 30 | Fill #0

## 2016-07-11 ENCOUNTER — Ambulatory Visit: Payer: Self-pay

## 2016-07-13 DIAGNOSIS — F4321 Adjustment disorder with depressed mood: Secondary | ICD-10-CM | POA: Diagnosis not present

## 2016-07-24 ENCOUNTER — Other Ambulatory Visit: Payer: Self-pay

## 2016-07-24 NOTE — Patient Outreach (Signed)
[facility]) Care Management  07/24/2016  Michael PiggRichard Foster 07/06/1976 [id number]   Member did not show for scheduled Link to Wellness appointment 07/11/16.  Missed appointment letter sent. Dudley MajorMelissa Glendal Cassaday RN, [facility] Care Management Coordinator-Link to Wellness [facility] [phone]

## 2016-07-31 DIAGNOSIS — F909 Attention-deficit hyperactivity disorder, unspecified type: Secondary | ICD-10-CM | POA: Diagnosis not present

## 2016-08-02 MED FILL — TRUEplus LANCETS 30G MISC: 50 days supply | Qty: 100 | Fill #1

## 2016-08-02 MED FILL — VICTOZA 2-PAK 18 MG/3 ML PE: 18 | 30 days supply | Qty: 6 | Fill #3

## 2016-08-02 MED FILL — LISINOPRIL 2.5 MG TABLET: 2.5 | 90 days supply | Qty: 90 | Fill #2

## 2016-08-02 MED FILL — UNIFINE PENTIPS 8MM 31G: 31G X 8 MM | 90 days supply | Qty: 100 | Fill #0

## 2016-08-02 MED FILL — TRUE METRIX GLUCOSE TEST ST: 50 days supply | Qty: 100 | Fill #1

## 2016-08-02 MED FILL — METFORMIN HCL ER 500 MG TAB: 500 | 90 days supply | Qty: 180 | Fill #3

## 2016-08-09 ENCOUNTER — Encounter (HOSPITAL_BASED_OUTPATIENT_CLINIC_OR_DEPARTMENT_OTHER): Payer: Self-pay | Admitting: *Deleted

## 2016-08-09 ENCOUNTER — Emergency Department (HOSPITAL_BASED_OUTPATIENT_CLINIC_OR_DEPARTMENT_OTHER)
Admission: EM | Admit: 2016-08-09 | Discharge: 2016-08-09 | Disposition: A | Discharge status: Home / Self Care | Payer: 59 | Attending: Emergency Medicine | Admitting: Emergency Medicine

## 2016-08-09 DIAGNOSIS — Z794 Long term (current) use of insulin: Secondary | ICD-10-CM | POA: Diagnosis not present

## 2016-08-09 DIAGNOSIS — F909 Attention-deficit hyperactivity disorder, unspecified type: Secondary | ICD-10-CM | POA: Insufficient documentation

## 2016-08-09 DIAGNOSIS — Z79899 Other long term (current) drug therapy: Secondary | ICD-10-CM | POA: Insufficient documentation

## 2016-08-09 DIAGNOSIS — J45909 Unspecified asthma, uncomplicated: Secondary | ICD-10-CM | POA: Insufficient documentation

## 2016-08-09 DIAGNOSIS — H6692 Otitis media, unspecified, left ear: Secondary | ICD-10-CM | POA: Diagnosis not present

## 2016-08-09 DIAGNOSIS — Z87891 Personal history of nicotine dependence: Secondary | ICD-10-CM | POA: Insufficient documentation

## 2016-08-09 DIAGNOSIS — H9202 Otalgia, left ear: Secondary | ICD-10-CM | POA: Diagnosis not present

## 2016-08-09 DIAGNOSIS — I10 Essential (primary) hypertension: Secondary | ICD-10-CM | POA: Diagnosis not present

## 2016-08-09 DIAGNOSIS — E119 Type 2 diabetes mellitus without complications: Secondary | ICD-10-CM | POA: Diagnosis not present

## 2016-08-09 HISTORY — DX: Other specified behavioral and emotional disorders with onset usually occurring in childhood and adolescence: F98.8

## 2016-08-09 MED ORDER — CARBAMIDE PEROXIDE 6.5 % OT SOLN
5.0000 [drp] | Freq: Every day | OTIC | 0 refills | Status: DC
Start: 2016-08-09 — End: 2016-09-17

## 2016-08-09 MED ORDER — FLUTICASONE PROPIONATE 50 MCG/ACT NA SUSP: 2.0000 | Freq: Every day | NASAL | 2 refills | Status: DC

## 2016-08-09 MED ORDER — AMOXICILLIN 500 MG PO CAPS: 500.0000 mg | ORAL_CAPSULE | Freq: Three times a day (TID) | ORAL | 0 refills | Status: DC

## 2016-08-09 MED FILL — AMOXICILLIN 500 MG CAPSULE: 500 | 7 days supply | Qty: 21 | Fill #0

## 2016-08-09 MED FILL — FLUTICASONE PROP 50 MCG SPR: 50 | 30 days supply | Qty: 16 | Fill #0

## 2016-08-09 NOTE — ED Provider Notes (Signed)
[facility] Provider Note   CSN: [id number] Arrival date & time: 08/09/16  1640  By signing my name below, I, Soijett Blue, attest that this documentation has been prepared under the direction and in the presence of Hannah Muthersbaugh, PA-C Electronically Signed: Soijett Blue, ED Scribe. 08/09/16. 5:41 PM.   History   Chief Complaint Chief Complaint  Patient presents with  . Otalgia    HPI Michael Foster is a 39 y.o. male with a PMHx of vertigo, HTN, DM, who presents to the Emergency Department complaining of left ear pain onset 2 weeks. Pt reports that he had his left ear evaluated 2 weeks ago and was informed that he would need to irrigate his ear. Pt states that he irrigated his ear with peroxide and water (primarily peroxide) and stopped due to feeling nauseous. Pt notes that he was also evaluated by another provider due to his ongoing left ear pain not resolving and was informed that his ear canal was red and "covered." Pt states that he is able to hear a "sloshing" sound when he moves his head. Pt denies wearing ear buds in his left ear. He states that he is having associated symptoms of muffled hearing to left ear. He states that he has tried irrigating his left ear with peroxide and water with no relief for his symptoms. He denies nasal congestion, sneezing, cough, fever, chills, ear drainage, and any other symptoms.      The history is provided by the patient. No language interpreter was used.    Past Medical History:  Diagnosis Date  . ADD (attention deficit disorder)   . Asthma    as a child  . Diabetes mellitus Dx 2009  . Gastroenteritis   . H/O TIA (transient ischemic attack) and stroke 01/04/2015   06/2014 with L hand numbness   . Hypertension Dx 2009  . Vertigo     Patient Active Problem List   Diagnosis Date Noted  . Chest pain 07/06/2015  . Dyspnea 07/06/2015  . Screening for STD (sexually transmitted disease) 06/09/2015  . H/O TIA (transient  ischemic attack) and stroke 01/04/2015  . Diabetes (HCC) 06/10/2013  . Morbid obesity (HCC) 06/10/2013  . Asthma, chronic 06/10/2013  . Backache 03/22/2010  . HYPERLIPIDEMIA 01/17/2010  . Essential hypertension 01/17/2010  . Sleep apnea 01/17/2010    Past Surgical History:  Procedure Laterality Date  . MOUTH SURGERY         Home Medications    Prior to Admission medications   Medication Sig Start Date End Date Taking? Authorizing Provider  Blood Glucose Monitoring Suppl (TRUE METRIX AIR GLUCOSE METER) W/DEVICE KIT 6 Units by Does not apply route 2 (two) times daily before a meal. 04/14/15  Yes Josalyn Funches, MD  docusate sodium (COLACE) 100 MG capsule Take 1 capsule (100 mg total) by mouth every 12 (twelve) hours. 03/13/16  Yes Donald Wickline, MD  glucose blood (TRUE METRIX BLOOD GLUCOSE TEST) test strip Use as instructed 04/14/15  Yes Josalyn Funches, MD  Insulin Syringes, Disposable, U-100 0.5 ML MISC 200 02/08/15  Yes Josalyn Funches, MD  Liraglutide (VICTOZA ) Inject into the skin.   Yes Historical Provider, MD  lisdexamfetamine (VYVANSE) 30 MG capsule Take 30 mg by mouth daily.   Yes Historical Provider, MD  lisinopril (PRINIVIL,ZESTRIL) 2.5 MG tablet Take 1 tablet (2.5 mg total) by mouth daily. 01/04/15  Yes Josalyn Funches, MD  metFORMIN (GLUCOPHAGE-XR) 500 MG 24 hr tablet Take 500 mg by mouth daily with   breakfast.    Yes Historical Provider, MD  TRUEPLUS LANCETS 30G MISC As directed 04/14/15  Yes Josalyn Funches, MD  VENTOLIN HFA 108 (90 Base) MCG/ACT inhaler INHALE 1-2 PUFFS INTO THE LUNGS EVERY 3 HOURS AS NEEDED FOR WHEEZING OR SHORTNESS OF BREATH. 12/01/15  Yes Josalyn Funches, MD  amoxicillin (AMOXIL) 500 MG capsule Take 1 capsule (500 mg total) by mouth 3 (three) times daily. 08/09/16   Hannah Muthersbaugh, PA-C  Calcium Carbonate Antacid (ALKA-SELTZER ANTACID PO) Take 1 tablet by mouth daily as needed.    Historical Provider, MD  carbamide peroxide (DEBROX) 6.5 % otic  solution Place 5 drops into both ears daily. 08/09/16   Hannah Muthersbaugh, PA-C  cetirizine (ZYRTEC) 10 MG tablet Take 10 mg by mouth daily as needed for allergies.    Historical Provider, MD  cyclobenzaprine (FLEXERIL) 10 MG tablet Take 1 tablet (10 mg total) by mouth 3 (three) times daily as needed for muscle spasms. 07/14/15   Josalyn Funches, MD  fluticasone (FLONASE) 50 MCG/ACT nasal spray Place 2 sprays into both nostrils daily. 08/09/16   Hannah Muthersbaugh, PA-C  insulin NPH-regular Human (NOVOLIN 70/30) (70-30) 100 UNIT/ML injection Inject 6 Units into the skin 2 (two) times daily with a meal. Patient taking differently: Inject 4 Units into the skin 2 (two) times daily with a meal.  04/14/15   Josalyn Funches, MD  metFORMIN (GLUCOPHAGE) 500 MG tablet Take 2 tablets (1,000 mg total) by mouth 2 (two) times daily with a meal. Patient not taking: Reported on 01/04/2016 03/09/15   Josalyn Funches, MD  metoCLOPramide (REGLAN) 10 MG tablet Take 1 tablet (10 mg total) by mouth 3 (three) times daily before meals. Patient not taking: Reported on 01/04/2016 07/14/15   Josalyn Funches, MD    Family History Family History  Problem Relation Age of Onset  . COPD Father   . Hypertension Father   . Heart failure Father   . Diabetes Father   . Diabetes Mother   . Hyperlipidemia Mother   . Sleep apnea Brother   . Hypertension Brother     Social History Social History  Substance Use Topics  . Smoking status: Former Smoker    Packs/day: 0.30    Years: 10.00    Types: Cigarettes    Quit date: 05/28/2013  . Smokeless tobacco: Never Used  . Alcohol use No     Allergies   Slo-bid gyrocaps [theophylline]   Review of Systems Review of Systems  Constitutional: Negative for chills and fever.  HENT: Positive for ear pain (left). Negative for congestion, ear discharge and sneezing.        Muffled hearing noted to left ear  Respiratory: Negative for cough.   All other systems reviewed and are  negative.    Physical Exam Updated Vital Signs BP 153/90 (BP Location: Right Arm)   Pulse 90   Temp 98.4 F (36.9 C) (Oral)   Resp 20   Ht 5' 9" (1.753 m)   Wt (!) 310 lb (140.6 kg)   SpO2 98%   BMI 45.78 kg/m   Physical Exam  Constitutional: He appears well-developed and well-nourished. No distress.  HENT:  Head: Normocephalic and atraumatic.  Right Ear: Tympanic membrane and external ear normal.  Left Ear: External ear normal. Tympanic membrane is injected, erythematous and bulging. A middle ear effusion is present.  Nose: No mucosal edema or rhinorrhea. No epistaxis. Right sinus exhibits no maxillary sinus tenderness and no frontal sinus tenderness. Left sinus exhibits no maxillary   sinus tenderness and no frontal sinus tenderness.  Mouth/Throat: Uvula is midline and mucous membranes are normal. Mucous membranes are not pale and not cyanotic. No oropharyngeal exudate, posterior oropharyngeal edema, posterior oropharyngeal erythema or tonsillar abscesses.  Left ear: small ball of wax noted in the left canal.  Right ear: minimal cerumen and nl TM.   Eyes: Conjunctivae are normal. Pupils are equal, round, and reactive to light.  Neck: Normal range of motion and full passive range of motion without pain.  Cardiovascular: Normal rate and intact distal pulses.   Pulmonary/Chest: Effort normal. No stridor.  Abdominal: Soft. There is no tenderness.  Musculoskeletal: Normal range of motion.  Lymphadenopathy:    He has no cervical adenopathy.  Neurological: He is alert.  Skin: Skin is warm and dry. No rash noted. He is not diaphoretic.  Psychiatric: He has a normal mood and affect.  Nursing note and vitals reviewed.    ED Treatments / Results  DIAGNOSTIC STUDIES: Oxygen Saturation is 99% on RA, nl by my interpretation.    COORDINATION OF CARE: 5:24 PM Discussed treatment plan with pt at bedside which includes debrox Rx, amoxil Rx, flonase Rx, and pt agreed to  plan.   Procedures Procedures (including critical care time)  Medications Ordered in ED Medications - No data to display   Initial Impression / Assessment and Plan / ED Course  I have reviewed the triage vital signs and the nursing notes.   Clinical Course    Patient presents with otalgia and exam consistent with acute otitis media. No concern for acute mastoiditis, meningitis.  Patient discharged home with Amoxicillin.  Advised patient to follow-up with PCP.  I have also discussed reasons to return immediately to the ER.  Pt expresses understanding and agrees with plan.   Final Clinical Impressions(s) / ED Diagnoses   Final diagnoses:  Left otitis media, unspecified otitis media type  Otalgia of left ear    New Prescriptions New Prescriptions   AMOXICILLIN (AMOXIL) 500 MG CAPSULE    Take 1 capsule (500 mg total) by mouth 3 (three) times daily.   CARBAMIDE PEROXIDE (DEBROX) 6.5 % OTIC SOLUTION    Place 5 drops into both ears daily.   FLUTICASONE (FLONASE) 50 MCG/ACT NASAL SPRAY    Place 2 sprays into both nostrils daily.   I personally performed the services described in this documentation, which was scribed in my presence. The recorded information has been reviewed and is accurate.     Jarrett Soho Dashon Mcintire, PA-C 08/09/16 [place], MD 08/09/16 2032

## 2016-08-09 NOTE — ED Notes (Signed)
Pt directed to pharmacy to pick up Rx 

## 2016-08-09 NOTE — Discharge Instructions (Signed)
1. Medications: Amoxicillin, flonase, debrox, usual home medications 2. Treatment: rest, drink plenty of fluids, flush ears after debrox 3. Follow Up: Please followup with your primary doctor in 3-5 days for discussion of your diagnoses and further evaluation after today's visit; if you do not have a primary care doctor use the resource guide provided to find one; Please return to the ER for worsening symptoms, fevers or other concerns

## 2016-08-09 NOTE — ED Triage Notes (Signed)
Pt reports left ear pain since yesterday

## 2016-08-12 MED FILL — VYVANSE 30 MG CAPSULE: 30 | 30 days supply | Qty: 30 | Fill #0

## 2016-08-20 ENCOUNTER — Other Ambulatory Visit: Payer: Self-pay

## 2016-08-20 VITALS — BP 132/82 | HR 84 | Resp 16 | Ht 69.0 in | Wt 313.4 lb

## 2016-08-20 DIAGNOSIS — E119 Type 2 diabetes mellitus without complications: Secondary | ICD-10-CM

## 2016-08-20 NOTE — Patient Outreach (Signed)
[facility]) Care Management   08/20/2016  Coy Rochford [birth year] [id number]  Michael Foster is an 40 y.o. male.   Member seen for follow up office visit for Link to Wellness program for self management of Type 2 diabetes  Subjective: Member states that he has lost weight since he started taking Vyvanse and Victoza.  States he does not feel like eating during the day because he is so busy.  States he is trying to eat something in the morning when he takes his medications.  States he is drinking an Ensure if he does not eat.  States he is eating more at night and ate more over the holidays.  States he has not been exercising.  States his blood sugars continue to run high sometimes.  States he has not been having any low readings that he felt he needed to treat.  States he is to go see his MD on 09/02/16 as he missed his last follow up appt.  Objective:   Review of Systems  All other systems reviewed and are negative.   Physical Exam Today's Vitals   08/20/16 1406 08/20/16 1414  BP: 132/82   Pulse: 84   Resp: 16   SpO2: 98%   Weight: (!) 313 lb 6.4 oz (142.2 kg)   Height: 1.753 m ('5\' 9"'$ )   PainSc: 0-No pain 0-No pain   Encounter Medications:   Outpatient Encounter Prescriptions as of 08/20/2016  Medication Sig Note  . Blood Glucose Monitoring Suppl (TRUE METRIX AIR GLUCOSE METER) W/DEVICE KIT 6 Units by Does not apply route 2 (two) times daily before a meal.   . carbamide peroxide (DEBROX) 6.5 % otic solution Place 5 drops into both ears daily.   . cetirizine (ZYRTEC) 10 MG tablet Take 10 mg by mouth daily as needed for allergies.   . cyclobenzaprine (FLEXERIL) 10 MG tablet Take 1 tablet (10 mg total) by mouth 3 (three) times daily as needed for muscle spasms.   Marland Kitchen docusate sodium (COLACE) 100 MG capsule Take 1 capsule (100 mg total) by mouth every 12 (twelve) hours.   . fluticasone (FLONASE) 50 MCG/ACT nasal spray Place 2 sprays into both nostrils daily.   Marland Kitchen  glucose blood (TRUE METRIX BLOOD GLUCOSE TEST) test strip Use as instructed   . Liraglutide (VICTOZA [place]) Inject 1.8 mg into the skin daily.    Marland Kitchen lisdexamfetamine (VYVANSE) 30 MG capsule Take 30 mg by mouth daily.   Marland Kitchen lisinopril (PRINIVIL,ZESTRIL) 2.5 MG tablet Take 1 tablet (2.5 mg total) by mouth daily.   . metFORMIN (GLUCOPHAGE-XR) 500 MG 24 hr tablet Take 500 mg by mouth daily with breakfast.    . TRUEPLUS LANCETS 30G MISC As directed   . VENTOLIN HFA 108 (90 Base) MCG/ACT inhaler INHALE 1-2 PUFFS INTO THE LUNGS EVERY 3 HOURS AS NEEDED FOR WHEEZING OR SHORTNESS OF BREATH.   Marland Kitchen amoxicillin (AMOXIL) 500 MG capsule Take 1 capsule (500 mg total) by mouth 3 (three) times daily. (Patient not taking: Reported on 08/20/2016)   . Calcium Carbonate Antacid (ALKA-SELTZER ANTACID PO) Take 1 tablet by mouth daily as needed.   . insulin NPH-regular Human (NOVOLIN 70/30) (70-30) 100 UNIT/ML injection Inject 6 Units into the skin 2 (two) times daily with a meal. (Patient not taking: Reported on 08/20/2016) 08/01/2015: Taking 4 units twice a day before meals  . Insulin Syringes, Disposable, U-100 0.5 ML MISC 200 (Patient not taking: Reported on 08/20/2016)   . metFORMIN (GLUCOPHAGE) 500 MG tablet Take  2 tablets (1,000 mg total) by mouth 2 (two) times daily with a meal. (Patient not taking: Reported on 08/20/2016)   . metoCLOPramide (REGLAN) 10 MG tablet Take 1 tablet (10 mg total) by mouth 3 (three) times daily before meals. (Patient not taking: Reported on 08/20/2016)    No facility-administered encounter medications on file as of 08/20/2016.     Functional Status:   In your present state of health, do you have any difficulty performing the following activities: 08/20/2016 04/11/2016  Hearing? N N  Vision? N N  Difficulty concentrating or making decisions? N N  Walking or climbing stairs? N N  Dressing or bathing? N N  Doing errands, shopping? N N  Some recent data might be hidden    Fall/Depression  Screening:    PHQ 2/9 Scores 08/20/2016 04/11/2016 01/04/2016 08/01/2015 07/14/2015 06/09/2015 03/09/2015  PHQ - 2 Score 0 0 0 0 0 0 0    Assessment:  Member seen for follow up office visit for Link to Wellness program for self management of Type 2 diabetes. Member not meeting goal of hemoglobin A1C of 7% with last reading 10.5%. Member has not been adherent with low CHO diet and exercise. Member  been checking CBGs 2-3 times a week with ranges from 138-318 Reports taking Metformin daily. Member taking Victoza daily but is taking only 1.'2mg'$  which he is tolerating. Member has lost 10 lbs since last Link to Wellness visit  Member taking Vyvanse for his ADHD and reports better focus. Member is up to date with annual eye exam which is due in December and dental check ups. Member is to see provider on 09/02/16.     Plan:  Plan to eat 45-60 GM (3-4) servings of carbohydrate a meal and 15 GM for snacks.  Plan to add a non-starchy vegetable or green salad to dinner.  Try drinking Glucerna instead of Ensure Plan to check blood sugar once a day either fasting or 1 -2hrs after a meal.  Goals of 80-130 fasting and 180 or less after eating. Plan to see MD on 09/02/16 Plan to follow in [place] program  [facility] CM Care Plan Problem One   Flowsheet Row Most Recent Value  Care Plan Problem One  Elevated blood sugars as evidenced by hemoglobin A1C of 10.9 related to dx of Type 2 DM  Role Documenting the Problem One  Care Management [place] for Problem One  Active  [facility] Long Term Goal (31-90 days)  Member will demonstrate lower blood sugars as evidenced by decrease of hemoglobin A1C to 8 in the next 90 days  [facility] Long Term Goal Start Date  08/20/16  Interventions for Problem One Long Term Goal  Reinforced CHO counting and portion sizes, Reinforced to limit his portion sizes of rice and to eat more vegetables,  Encouraged to eat non-starchy vegetables, Instructed to try drinking Glucerna instead of  Ensure to help keep his blood sugar from spiking up,  Reinforced to eat protein with his snacks, Reinforced to try taking his blood sugars at least daily and reviewed goals, Encouraged to work up to the full 1.'8mg'$  dose of Victoza as ordered since he is tolerating the 1.'2mg'$  dose, Reinforced on importance of regular exercise for glycemic control, Instructed he will be followed in the [place] program next year and he has enrolled in the program, Instructed to keep his appt with Dr. Marisue Humble on 09/02/16    Peter Garter RN, BSN,CCM Care Management Coordinator-Link to [place] Management (  [phone] 

## 2016-08-20 NOTE — Patient Instructions (Addendum)
1. Plan to eat 45-60 GM (3-4) servings of carbohydrate a meal and 15 GM for snacks.  Plan to add a non-starchy vegetable or green salad to dinner.  Try to drink Glucerna instead of Ensure 2. Plan to check blood sugar once a day either fasting or 1 -2hrs after a meal.  Goals of 80-130 fasting and 180 or less after eating. 3. Plan to see MD on 09/02/16 4. [facility]

## 2016-08-30 DIAGNOSIS — F909 Attention-deficit hyperactivity disorder, unspecified type: Secondary | ICD-10-CM | POA: Diagnosis not present

## 2016-09-02 DIAGNOSIS — Z7984 Long term (current) use of oral hypoglycemic drugs: Secondary | ICD-10-CM | POA: Diagnosis not present

## 2016-09-02 DIAGNOSIS — I10 Essential (primary) hypertension: Secondary | ICD-10-CM | POA: Diagnosis not present

## 2016-09-02 DIAGNOSIS — E781 Pure hyperglyceridemia: Secondary | ICD-10-CM | POA: Diagnosis not present

## 2016-09-02 DIAGNOSIS — E1165 Type 2 diabetes mellitus with hyperglycemia: Secondary | ICD-10-CM | POA: Diagnosis not present

## 2016-09-11 MED FILL — VICTOZA 18 MG/3 ML INJECT P: 18 | 84 days supply | Qty: 9 | Fill #0

## 2016-09-17 ENCOUNTER — Emergency Department (HOSPITAL_BASED_OUTPATIENT_CLINIC_OR_DEPARTMENT_OTHER): Payer: 59

## 2016-09-17 ENCOUNTER — Encounter (HOSPITAL_BASED_OUTPATIENT_CLINIC_OR_DEPARTMENT_OTHER): Payer: Self-pay

## 2016-09-17 ENCOUNTER — Emergency Department (HOSPITAL_BASED_OUTPATIENT_CLINIC_OR_DEPARTMENT_OTHER)
Admission: EM | Admit: 2016-09-17 | Discharge: 2016-09-17 | Disposition: A | Discharge status: Home / Self Care | Payer: 59 | Attending: Emergency Medicine | Admitting: Emergency Medicine

## 2016-09-17 DIAGNOSIS — B9789 Other viral agents as the cause of diseases classified elsewhere: Secondary | ICD-10-CM

## 2016-09-17 DIAGNOSIS — J4521 Mild intermittent asthma with (acute) exacerbation: Secondary | ICD-10-CM

## 2016-09-17 DIAGNOSIS — J45901 Unspecified asthma with (acute) exacerbation: Secondary | ICD-10-CM | POA: Insufficient documentation

## 2016-09-17 DIAGNOSIS — J069 Acute upper respiratory infection, unspecified: Secondary | ICD-10-CM | POA: Diagnosis not present

## 2016-09-17 DIAGNOSIS — E119 Type 2 diabetes mellitus without complications: Secondary | ICD-10-CM | POA: Diagnosis not present

## 2016-09-17 DIAGNOSIS — I10 Essential (primary) hypertension: Secondary | ICD-10-CM | POA: Diagnosis not present

## 2016-09-17 DIAGNOSIS — Z7984 Long term (current) use of oral hypoglycemic drugs: Secondary | ICD-10-CM | POA: Insufficient documentation

## 2016-09-17 DIAGNOSIS — R05 Cough: Secondary | ICD-10-CM | POA: Diagnosis not present

## 2016-09-17 DIAGNOSIS — Z87891 Personal history of nicotine dependence: Secondary | ICD-10-CM | POA: Diagnosis not present

## 2016-09-17 MED ORDER — PREDNISONE 50 MG PO TABS
60.0000 mg | ORAL_TABLET | Freq: Once | ORAL | Status: AC
  Administered 2016-09-17: 60 mg via ORAL
  Filled 2016-09-17: qty 1

## 2016-09-17 MED ORDER — ALBUTEROL SULFATE HFA 108 (90 BASE) MCG/ACT IN AERS: 1.0000 | INHALATION_SPRAY | Freq: Four times a day (QID) | RESPIRATORY_TRACT | 0 refills | Status: DC | PRN

## 2016-09-17 MED ORDER — ALBUTEROL SULFATE (2.5 MG/3ML) 0.083% IN NEBU
2.5000 mg | INHALATION_SOLUTION | Freq: Once | RESPIRATORY_TRACT | Status: AC
  Administered 2016-09-17: 2.5 mg via RESPIRATORY_TRACT
  Filled 2016-09-17: qty 3

## 2016-09-17 MED ORDER — PREDNISONE 20 MG PO TABS: 60.0000 mg | ORAL_TABLET | Freq: Every day | ORAL | 0 refills | Status: AC

## 2016-09-17 MED ORDER — IPRATROPIUM-ALBUTEROL 0.5-2.5 (3) MG/3ML IN SOLN
3.0000 mL | Freq: Once | RESPIRATORY_TRACT | Status: AC
  Administered 2016-09-17: 3 mL via RESPIRATORY_TRACT
  Filled 2016-09-17: qty 3

## 2016-09-17 NOTE — ED Triage Notes (Signed)
C/o prod cough-using albuterol inhaler frequently-NAD-steady gait

## 2016-09-17 NOTE — Discharge Instructions (Signed)
Please read and follow all provided instructions.  Your diagnoses today include:  1. Viral URI with cough    Tests performed today include: Vital signs. See below for your results today.   Medications prescribed:  Take as prescribed   Home care instructions:  Follow any educational materials contained in this packet.  Follow-up instructions: Please follow-up with your primary care provider for further evaluation of symptoms and treatment   Return instructions:  Please return to the Emergency Department if you do not get better, if you get worse, or new symptoms OR  - Fever (temperature greater than 101.15F)  - Bleeding that does not stop with holding pressure to the area    -Severe pain (please note that you may be more sore the day after your accident)  - Chest Pain  - Difficulty breathing  - Severe nausea or vomiting  - Inability to tolerate food and liquids  - Passing out  - Skin becoming red around your wounds  - Change in mental status (confusion or lethargy)  - New numbness or weakness    Please return if you have any other emergent concerns.  Additional Information:  Your vital signs today were: BP 164/89 (BP Location: Left Arm)    Pulse 109    Temp 98.6 F (37 C) (Oral)    Resp 20    SpO2 100%  If your blood pressure (BP) was elevated above 135/85 this visit, please have this repeated by your doctor within one month. ---------------

## 2016-09-17 NOTE — ED Provider Notes (Signed)
[facility] Provider Note   CSN: [id number] Arrival date & time: 09/17/16  1955  By signing my name below, I, Higinio Plan, attest that this documentation has been prepared under the direction and in the presence of non-physician practitioner, Shary Decamp, PA-C. Electronically Signed: Higinio Plan, Scribe. 09/17/2016. 9:19 PM.  History   Chief Complaint Chief Complaint  Patient presents with  . Cough   The history is provided by the patient. No language interpreter was used.   HPI Comments: Michael Foster is a 40 y.o. male with PMHx of DM, HTN and asthma, who presents to the Emergency Department complaining of gradually worsening, dry cough that began a few days ago. Pt reports he also awoke this morning with "chest pressure" and shortness of breath. He states he used his albuterol inhaler 2-3 times every hour today with no relief. He denies wheezing, fever, rhinorrhea, ear pain and sore throat. He notes he is currently taking metformin and victoza for his DM.   Past Medical History:  Diagnosis Date  . ADD (attention deficit disorder)   . Asthma    as a child  . Diabetes mellitus Dx 2009  . Gastroenteritis   . H/O TIA (transient ischemic attack) and stroke 01/04/2015   06/2014 with L hand numbness   . Hypertension Dx 2009  . Vertigo     Patient Active Problem List   Diagnosis Date Noted  . Chest pain 07/06/2015  . Dyspnea 07/06/2015  . Screening for STD (sexually transmitted disease) 06/09/2015  . H/O TIA (transient ischemic attack) and stroke 01/04/2015  . Diabetes ([place]) 06/10/2013  . Morbid obesity ([place]) 06/10/2013  . Asthma, chronic 06/10/2013  . Backache 03/22/2010  . HYPERLIPIDEMIA 01/17/2010  . Essential hypertension 01/17/2010  . Sleep apnea 01/17/2010    Past Surgical History:  Procedure Laterality Date  . MOUTH SURGERY      Home Medications    Prior to Admission medications   Medication Sig Start Date End Date Taking? Authorizing Provider  Blood  Glucose Monitoring Suppl (TRUE METRIX AIR GLUCOSE METER) W/DEVICE KIT 6 Units by Does not apply route 2 (two) times daily before a meal. 04/14/15   Boykin Nearing, MD  Calcium Carbonate Antacid (ALKA-SELTZER ANTACID PO) Take 1 tablet by mouth daily as needed.    Historical Provider, MD  cetirizine (ZYRTEC) 10 MG tablet Take 10 mg by mouth daily as needed for allergies.    Historical Provider, MD  cyclobenzaprine (FLEXERIL) 10 MG tablet Take 1 tablet (10 mg total) by mouth 3 (three) times daily as needed for muscle spasms. 07/14/15   Josalyn Funches, MD  docusate sodium (COLACE) 100 MG capsule Take 1 capsule (100 mg total) by mouth every 12 (twelve) hours. 03/13/16   Ripley Fraise, MD  glucose blood (TRUE METRIX BLOOD GLUCOSE TEST) test strip Use as instructed 04/14/15   Boykin Nearing, MD  Liraglutide (VICTOZA [place]) Inject 1.8 mg into the skin daily.     Historical Provider, MD  lisdexamfetamine (VYVANSE) 30 MG capsule Take 30 mg by mouth daily.    Historical Provider, MD  lisinopril (PRINIVIL,ZESTRIL) 2.5 MG tablet Take 1 tablet (2.5 mg total) by mouth daily. 01/04/15   Boykin Nearing, MD  metFORMIN (GLUCOPHAGE) 500 MG tablet Take 2 tablets (1,000 mg total) by mouth 2 (two) times daily with a meal. Patient not taking: Reported on 08/20/2016 03/09/15   Boykin Nearing, MD  metFORMIN (GLUCOPHAGE-XR) 500 MG 24 hr tablet Take 500 mg by mouth daily with breakfast.  Historical Provider, MD  TRUEPLUS LANCETS 30G MISC As directed 04/14/15   Boykin Nearing, MD  VENTOLIN HFA 108 (90 Base) MCG/ACT inhaler INHALE 1-2 PUFFS INTO THE LUNGS EVERY 3 HOURS AS NEEDED FOR WHEEZING OR SHORTNESS OF BREATH. 12/01/15   Boykin Nearing, MD    Family History Family History  Problem Relation Age of Onset  . COPD Father   . Hypertension Father   . Heart failure Father   . Diabetes Father   . Diabetes Mother   . Hyperlipidemia Mother   . Sleep apnea Brother   . Hypertension Brother     Social History Social  History  Substance Use Topics  . Smoking status: Former Smoker    Packs/day: 0.30    Years: 10.00    Types: Cigarettes    Quit date: 05/28/2013  . Smokeless tobacco: Never Used  . Alcohol use No     Allergies   Slo-bid gyrocaps [theophylline]   Review of Systems Review of Systems  Constitutional: Negative for fever.  HENT: Negative for ear pain, rhinorrhea and sore throat.   Respiratory: Positive for cough and shortness of breath. Negative for wheezing.    Physical Exam Updated Vital Signs BP 164/89 (BP Location: Left Arm)   Pulse 109   Temp 98.6 F (37 C) (Oral)   Resp 20   SpO2 100%   Physical Exam  Constitutional: He is oriented to person, place, and time. He appears well-developed and well-nourished. No distress.  HENT:  Head: Normocephalic and atraumatic.  Right Ear: Tympanic membrane, external ear and ear canal normal.  Left Ear: Tympanic membrane, external ear and ear canal normal.  Nose: Nose normal.  Mouth/Throat: Uvula is midline, oropharynx is clear and moist and mucous membranes are normal. No trismus in the jaw. No oropharyngeal exudate, posterior oropharyngeal erythema or tonsillar abscesses.  Eyes: EOM are normal. Pupils are equal, round, and reactive to light.  Neck: Normal range of motion. Neck supple. No tracheal deviation present.  Cardiovascular: Normal rate, regular rhythm, S1 normal, S2 normal, normal heart sounds, intact distal pulses and normal pulses.   Pulmonary/Chest: Effort normal and breath sounds normal. No respiratory distress. He has no decreased breath sounds. He has no wheezes. He has no rhonchi. He has no rales.  Abdominal: Normal appearance and bowel sounds are normal. He exhibits no distension. There is no tenderness.  Musculoskeletal: Normal range of motion.  Neurological: He is alert and oriented to person, place, and time.  Skin: Skin is warm and dry.  Psychiatric: He has a normal mood and affect. His speech is normal and behavior  is normal. Thought content normal.  Nursing note and vitals reviewed.  ED Treatments / Results  Labs (all labs ordered are listed, but only abnormal results are displayed) Labs Reviewed - No data to display  EKG  EKG Interpretation None      Radiology Dg Chest 2 View  Result Date: 09/17/2016 CLINICAL DATA:  Cough since this morning. EXAM: CHEST  2 VIEW COMPARISON:  07/06/2015 FINDINGS: The cardiomediastinal contours are normal. Mild hyperinflation and bronchial thickening. Pulmonary vasculature is normal. No consolidation, pleural effusion, or pneumothorax. No acute osseous abnormalities are seen. IMPRESSION: Mild hyperinflation or bronchial thickening, suggestive of asthma or bronchitis. Electronically Signed   By: Jeb Levering M.D.   On: 09/17/2016 21:41    Procedures Procedures (including critical care time)  Medications Ordered in ED Medications  ipratropium-albuterol (DUONEB) 0.5-2.5 (3) MG/3ML nebulizer solution 3 mL (3 mLs Nebulization Given 09/17/16  2011)  albuterol (PROVENTIL) (2.5 MG/3ML) 0.083% nebulizer solution 2.5 mg (2.5 mg Nebulization Given 09/17/16 2011)   DIAGNOSTIC STUDIES:  Oxygen Saturation is 100% on RA, normal by my interpretation.    COORDINATION OF CARE:  9:18 PM Discussed treatment plan with pt at bedside and pt agreed to plan.  Initial Impression / Assessment and Plan / ED Course  I have reviewed the triage vital signs and the nursing notes.  Pertinent labs & imaging results that were available during my care of the patient were reviewed by me and considered in my medical decision making (see chart for details).  Clinical Course     Final Clinical Impressions(s) / ED Diagnoses    {I have reviewed and evaluated the relevant imaging studies.  {I have reviewed the relevant previous healthcare records.  {I obtained HPI from historian.   ED Course:  Assessment: Pt is a 40yM presents with URI symptoms x 2 days . On exam, pt in NAD. VSS.  Afebrile. Lungs CTA, Heart RRR. Abdomen nontender/soft. Pt CXR negative for acute infiltrate. Peribronchial thickening noted. Patients symptoms are consistent with URI, likely viral etiology with asthma exacerbation. Discussed that antibiotics are not indicated for viral infections. Pt will be discharged with symptomatic treatment. Given Prednisone and refilled inhaler. Verbalizes understanding and is agreeable with plan. Pt is hemodynamically stable & in NAD prior to dc  Disposition/Plan:  DC Home Additional Verbal discharge instructions given and discussed with patient.  Pt Instructed to f/u with PCP in the next week for evaluation and treatment of symptoms. Return precautions given Pt acknowledges and agrees with plan  Supervising Physician Leo Grosser, MD  Final diagnoses:  Viral URI with cough  Mild intermittent asthma with exacerbation    New Prescriptions New Prescriptions   No medications on file     Shary Decamp, PA-C 09/17/16 2225    Leo Grosser, MD 09/18/16 [phone]

## 2016-09-18 MED FILL — VENTOLIN HFA 90 MCG INHALER: 108 (90 BAS | 30 days supply | Qty: 18 | Fill #0

## 2016-09-18 MED FILL — predniSONE 20 MG TABS: 20 | 5 days supply | Qty: 15 | Fill #0

## 2016-09-18 MED FILL — VYVANSE 30 MG CAPSULE: 30 | 30 days supply | Qty: 30 | Fill #0

## 2016-09-19 DIAGNOSIS — E119 Type 2 diabetes mellitus without complications: Secondary | ICD-10-CM | POA: Diagnosis not present

## 2016-09-27 DIAGNOSIS — F909 Attention-deficit hyperactivity disorder, unspecified type: Secondary | ICD-10-CM | POA: Diagnosis not present

## 2016-09-30 DIAGNOSIS — M5489 Other dorsalgia: Secondary | ICD-10-CM | POA: Diagnosis not present

## 2016-10-01 MED FILL — CYCLOBENZAPRINE 10 MG TAB: 10 | 15 days supply | Qty: 15 | Fill #0

## 2016-10-01 MED FILL — traMADol HCL 50 MG TABS: 50 | 7 days supply | Qty: 21 | Fill #0

## 2016-10-13 IMAGING — MR MR HEAD W/O CM
9 of 10 series · 34 of 48 positions shown · non-contrast
Comparison: CT head 07/22/2014

CLINICAL DATA: Numbness and tingling left arm.  Nausea and vomiting

EXAM:
MRI HEAD WITHOUT CONTRAST
TECHNIQUE: Multiplanar, multiecho pulse sequences of the brain and surrounding
structures were obtained without intravenous contrast.

[Series 3: DWI · axial · 5.0mm · 1.02mm/px · z∈[-115,+34]mm · 6 of 62 slices shown (1 of 4)]
[im 1/62]
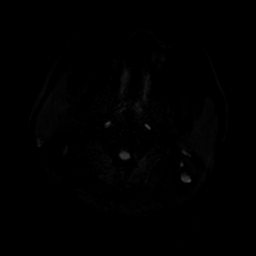
[im 13/62]
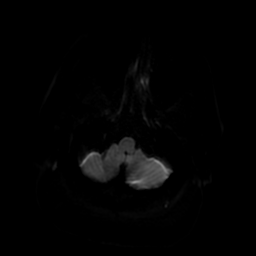
[im 25/62]
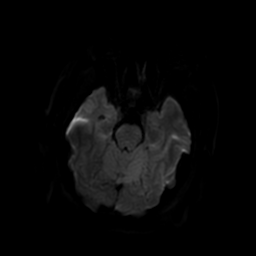
[im 37/62]
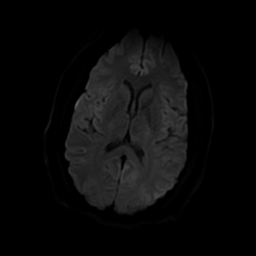
[im 49/62]
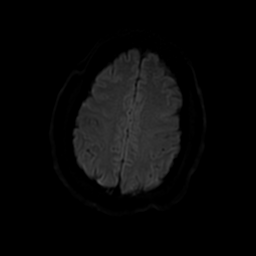
[im 62/62]
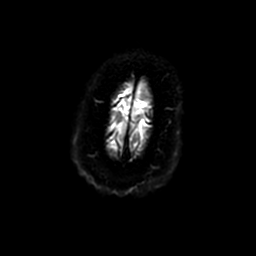

[Series 4: T2 · axial · 5.0mm · 0.43mm/px · z∈[-99,+39]mm · 3 of 24 slices shown (1 of 2)]
[im 1/24]
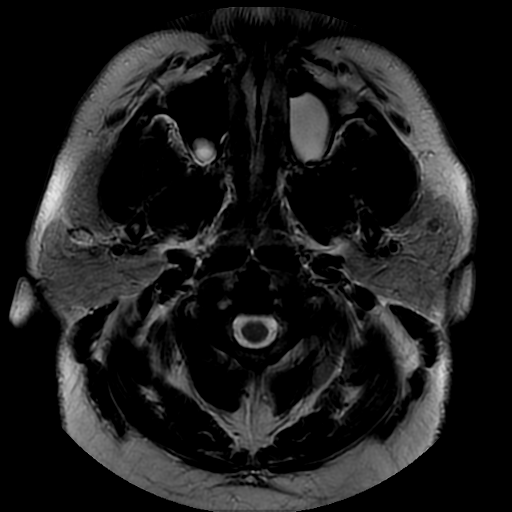
[im 12/24]
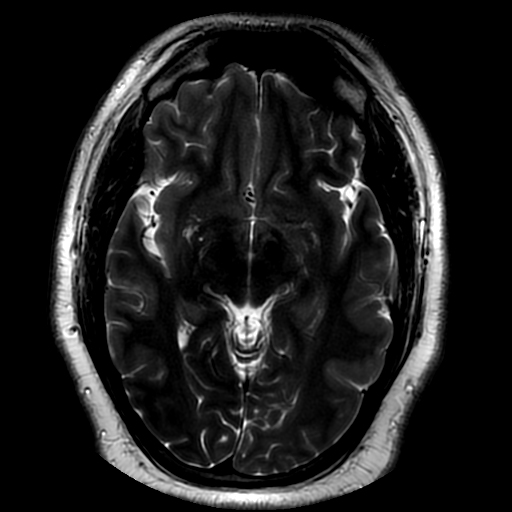
[im 24/24]
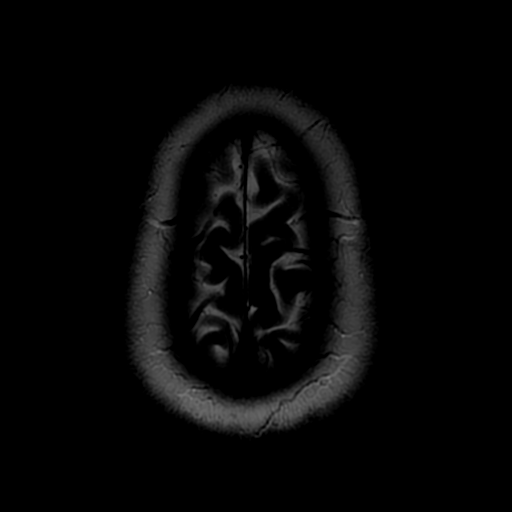

[Series 5: FLAIR · sagittal · 5.0mm · 0.47mm/px · 2 of 23 slices shown (1 of 2)]
[im 1/23]
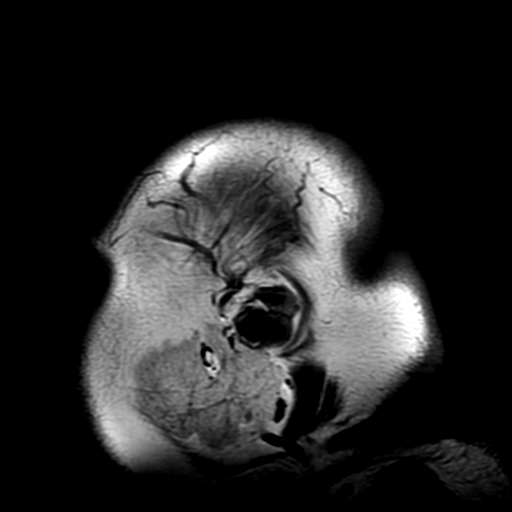
[im 23/23]
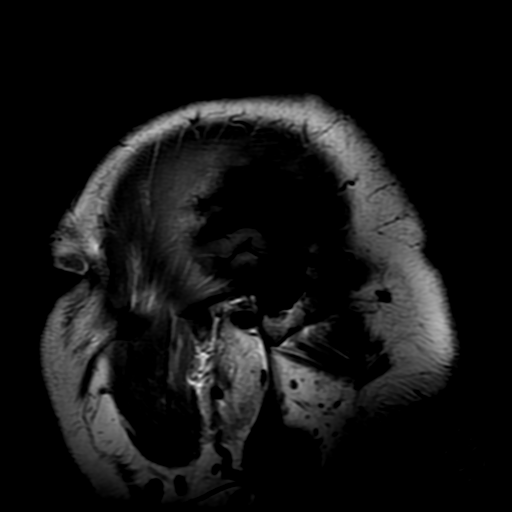

[Series 6: DWI · coronal · 5.0mm · 1.02mm/px · 8 of 72 slices shown (2 of 4)]
[im 1/72]
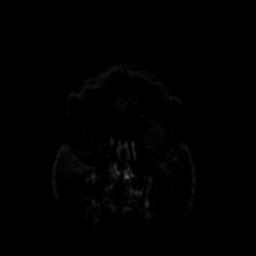
[im 11/72]
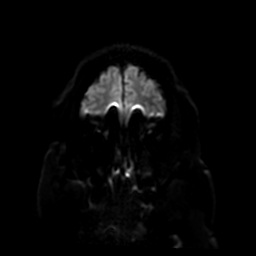
[im 21/72]
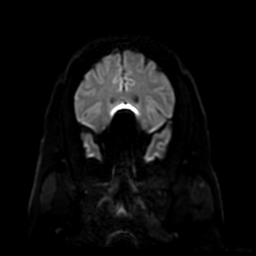
[im 31/72]
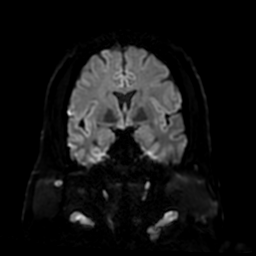
[im 41/72]
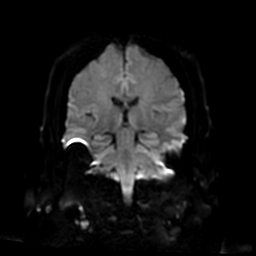
[im 51/72]
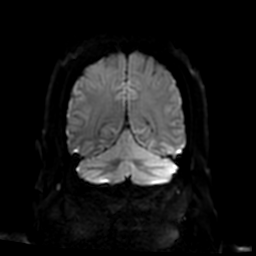
[im 61/72]
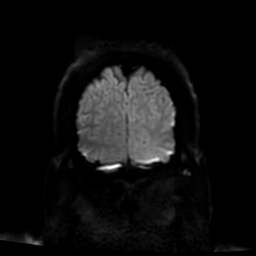
[im 72/72]
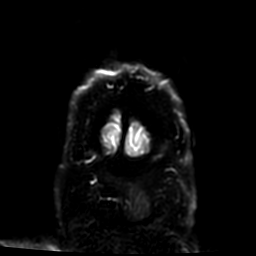

[Series 8: (person_name) · axial · 3.2mm · 0.47mm/px · 1 of 92 slices shown]
[im 1/92]
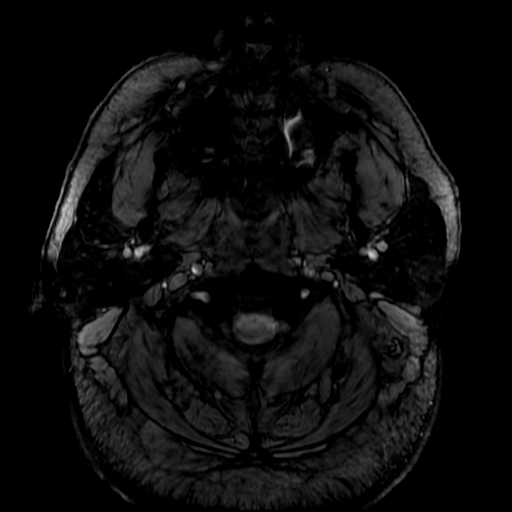

[Series 10: T2 · coronal · 5.0mm · 0.47mm/px · 4 of 36 slices shown (2 of 2)]
[im 1/36]
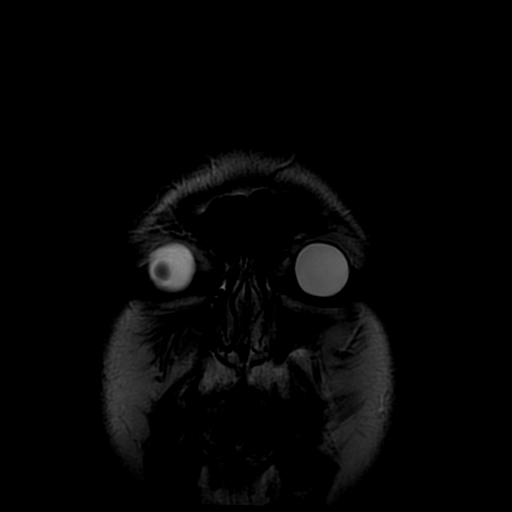
[im 12/36]
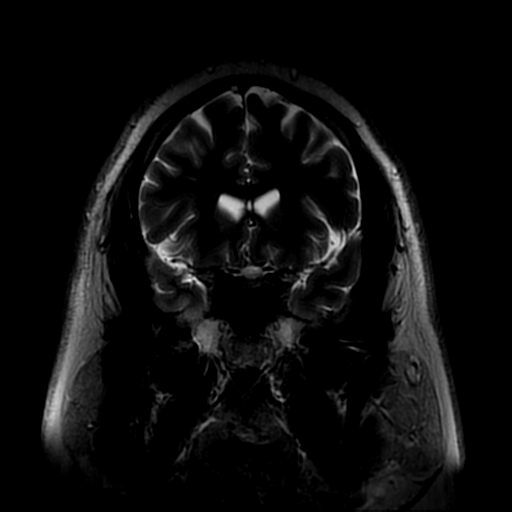
[im 24/36]
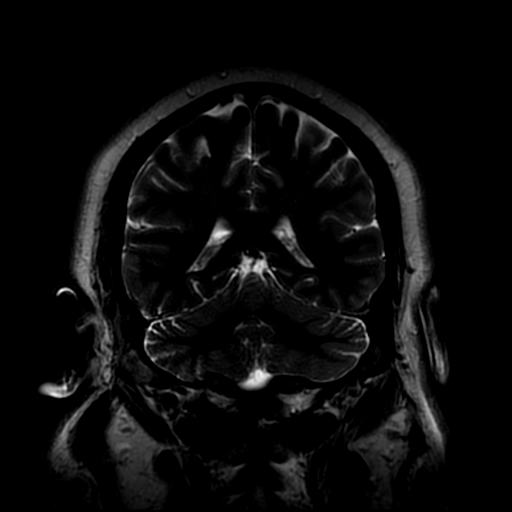
[im 36/36]
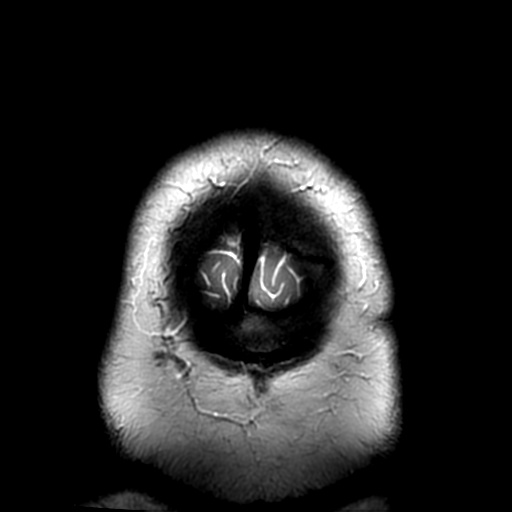

[Series 11: FLAIR · axial · 5.0mm · 0.43mm/px · z∈[-99,+39]mm · 3 of 24 slices shown (2 of 2)]
[im 1/24]
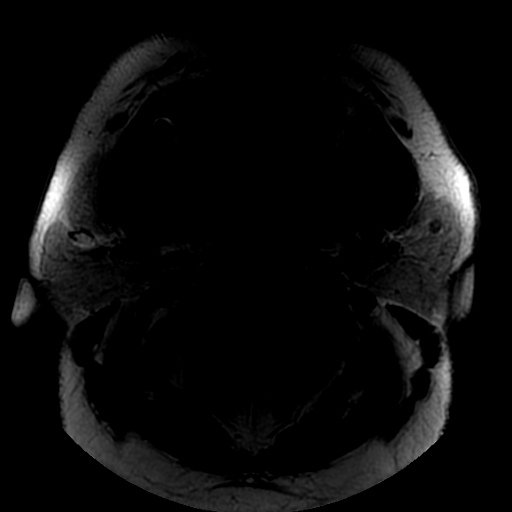
[im 12/24]
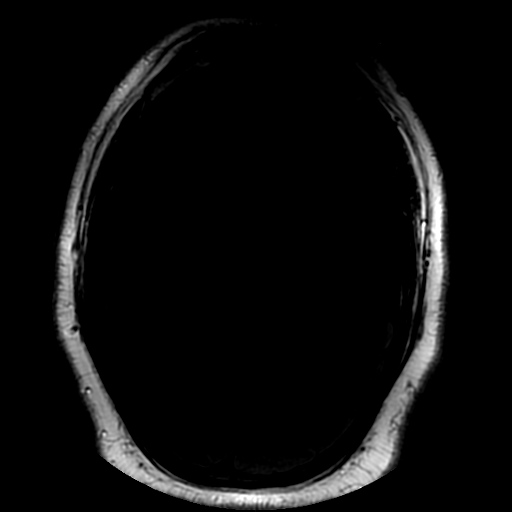
[im 24/24]
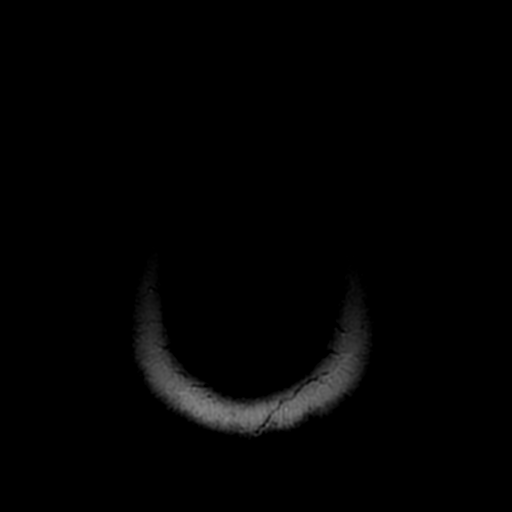

[Series 300: DWI · axial · 5.0mm · 1.02mm/px · z∈[-115,+34]mm · 3 of 31 slices shown (3 of 4)]
[im 1/31]
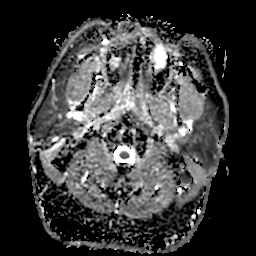
[im 16/31]
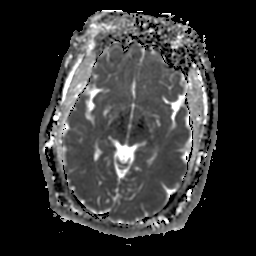
[im 31/31]
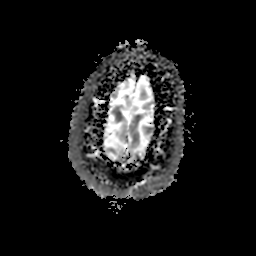

[Series 600: DWI · coronal · 5.0mm · 1.02mm/px · 4 of 36 slices shown (4 of 4)]
[im 1/36]
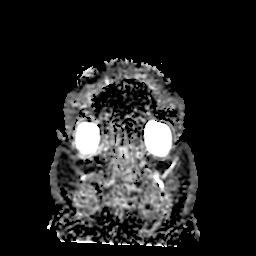
[im 12/36]
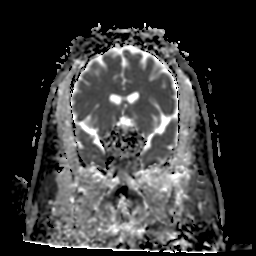
[im 24/36]
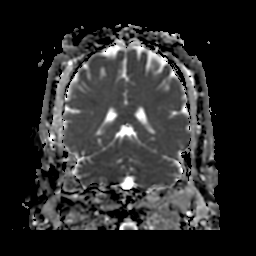
[im 36/36]
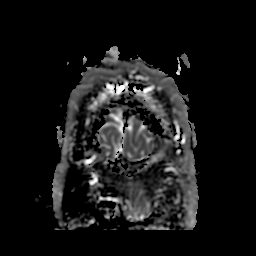

[34 of 48 positions shown; findings below may reference images not displayed]

FINDINGS: Cerebral volume is normal. Ventricle size is normal. Pituitary
normal in size. Craniocervical junction is normal.

Negative for acute or chronic infarction

Negative for demyelinating disease. Cerebral white matter is normal.
Brainstem and cerebellum are normal.

Negative for hemorrhage or mass lesion. No edema or shift of the
midline structures.

Mucous retention cyst in the maxillary sinus bilaterally
IMPRESSION: Normal MRI of the brain without contrast

Mucous retention cysts in the maxillary sinus bilaterally.

## 2016-10-18 MED FILL — VICTOZA 18 MG/3 ML INJECT P: 18 | 30 days supply | Qty: 9 | Fill #0

## 2016-10-18 MED FILL — LISINOPRIL 2.5 MG TABLET: 2.5 | 30 days supply | Qty: 30 | Fill #0

## 2016-11-04 MED FILL — VYVANSE 30 MG CAPSULE: 30 | 30 days supply | Qty: 30 | Fill #0

## 2016-11-14 DIAGNOSIS — F909 Attention-deficit hyperactivity disorder, unspecified type: Secondary | ICD-10-CM | POA: Diagnosis not present

## 2016-11-21 ENCOUNTER — Telehealth: Payer: Self-pay | Admitting: Pulmonary Disease

## 2016-11-21 DIAGNOSIS — G4733 Obstructive sleep apnea (adult) (pediatric): Secondary | ICD-10-CM

## 2016-11-21 NOTE — Telephone Encounter (Signed)
Pt is requesting a prescription for new CPAP, mask and supplies. Pt states that his current machine is over 39 years old and his mask cushions have ruptured -- pt is unable to sleep all night d/t mask not holding pressure.  Previous Dr Vassie LollAlva patient, last seen by TP 2015. Please advise Dr Vassie LollAlva if you are okay with writing a Rx for new mask to get him by until his appt scheduled 01/03/17. Thanks.

## 2016-11-22 NOTE — Telephone Encounter (Signed)
Ok for new mask Needs to keep appt in the future

## 2016-11-22 NOTE — Telephone Encounter (Signed)
Pt aware that we are placing an order to [facility] for a new mask. Aware also that any other orders will be sent at the time of his visit with Dr Vassie LollAlva 01/03/17. Nothing further needed.

## 2016-11-27 MED FILL — METFORMIN HCL ER 500 MG TAB: 500 | 90 days supply | Qty: 180 | Fill #0

## 2016-11-27 MED FILL — VENTOLIN HFA 90 MCG INHALER: 108 (90 BAS | 21 days supply | Qty: 18 | Fill #1

## 2016-11-27 MED FILL — VICTOZA 18 MG/3 ML INJECT P: 18 | 30 days supply | Qty: 9 | Fill #1

## 2016-12-05 DIAGNOSIS — G4733 Obstructive sleep apnea (adult) (pediatric): Secondary | ICD-10-CM | POA: Diagnosis not present

## 2016-12-10 ENCOUNTER — Other Ambulatory Visit (HOSPITAL_COMMUNITY)
Admission: RE | Admit: 2016-12-10 | Discharge: 2016-12-10 | Disposition: A | Discharge status: Home / Self Care | Payer: 59 | Source: Ambulatory Visit | Attending: Family Medicine | Admitting: Family Medicine

## 2016-12-10 ENCOUNTER — Ambulatory Visit (INDEPENDENT_AMBULATORY_CARE_PROVIDER_SITE_OTHER): Payer: 59 | Admitting: Family Medicine

## 2016-12-10 VITALS — BP 150/76 | HR 98 | Temp 98.7°F | Wt 321.4 lb

## 2016-12-10 DIAGNOSIS — Z7689 Persons encountering health services in other specified circumstances: Secondary | ICD-10-CM

## 2016-12-10 LAB — POCT GLYCOSYLATED HEMOGLOBIN (HGB A1C): Hemoglobin A1C: 8.6

## 2016-12-10 MED ORDER — GLUCOSE BLOOD VI STRP: ORAL_STRIP | 12 refills | Status: DC

## 2016-12-10 MED FILL — ACCU-CHEK GUIDE TEST STRIP: 30 days supply | Qty: 100 | Fill #0

## 2016-12-10 NOTE — Patient Instructions (Addendum)
It was great seeing you today! We have addressed the following issues today  1. I will increase your lisinopril to 5 mg daily and will reassess next time we see each other in clinic 2. I will check your A1c, CMP and an STD panel, you can check your result on my chart. 3. Continue to take the rest of your medications as prescribed.  If we did any lab work today, and the results require attention, either me or my nurse will get in touch with you. If everything is normal, you will get a letter in mail and a message via . If you don't hear from [place] in two weeks, please give [place] a call. Otherwise, we look forward to seeing you again at your next visit. If you have any questions or concerns before then, please call the clinic at [phone].  Please bring all your medications to every doctors visit  Sign up for My Chart to have easy access to your labs results, and communication with your Primary care physician.    Please check-out at the front desk before leaving the clinic.    Take Care,   Dr. Sydnee Cabal    Fat and Cholesterol Restricted Diet High levels of fat and cholesterol in your blood may lead to various health problems, such as diseases of the heart, blood vessels, gallbladder, liver, and pancreas. Fats are concentrated sources of energy that come in various forms. Certain types of fat, including saturated fat, may be harmful in excess. Cholesterol is a substance needed by your body in small amounts. Your body makes all the cholesterol it needs. Excess cholesterol comes from the food you eat. When you have high levels of cholesterol and saturated fat in your blood, health problems can develop because the excess fat and cholesterol will gather along the walls of your blood vessels, causing them to narrow. Choosing the right foods will help you control your intake of fat and cholesterol. This will help keep the levels of these substances in your blood within normal limits and reduce your risk of  disease. What is my plan? Your health care provider recommends that you:  Limit your fat intake to ______% or less of your total calories per day.  Limit the amount of cholesterol in your diet to less than _________mg per day.  Eat 20-30 grams of fiber each day. What types of fat should I choose?  Choose healthy fats more often. Choose monounsaturated and polyunsaturated fats, such as olive and canola oil, flaxseeds, walnuts, almonds, and seeds.  Eat more omega-3 fats. Good choices include salmon, mackerel, sardines, tuna, flaxseed oil, and ground flaxseeds. Aim to eat fish at least two times a week.  Limit saturated fats. Saturated fats are primarily found in animal products, such as meats, butter, and cream. Plant sources of saturated fats include palm oil, palm kernel oil, and coconut oil.  Avoid foods with partially hydrogenated oils in them. These contain trans fats. Examples of foods that contain trans fats are stick margarine, some tub margarines, cookies, crackers, and other baked goods. What general guidelines do I need to follow? These guidelines for healthy eating will help you control your intake of fat and cholesterol:  Check food labels carefully to identify foods with trans fats or high amounts of saturated fat.  Fill one half of your plate with vegetables and green salads.  Fill one fourth of your plate with whole grains. Look for the word "whole" as the first word in the ingredient  list.  Fill one fourth of your plate with lean protein foods.  Limit fruit to two servings a day. Choose fruit instead of juice.  Eat more foods that contain fiber, such as apples, broccoli, carrots, beans, peas, and barley.  Eat more home-cooked food and less restaurant, buffet, and fast food.  Limit or avoid alcohol.  Limit foods high in starch and sugar.  Limit fried foods.  Cook foods using methods other than frying. Baking, boiling, grilling, and broiling are all great  options.  Lose weight if you are overweight. Losing just 5-10% of your initial body weight can help your overall health and prevent diseases such as diabetes and heart disease. What foods can I eat? Grains   Whole grains, such as whole wheat or whole grain breads, crackers, cereals, and pasta. Unsweetened oatmeal, bulgur, barley, quinoa, or brown rice. Corn or whole wheat flour tortillas. Vegetables   Fresh or frozen vegetables (raw, steamed, roasted, or grilled). Green salads. Fruits   All fresh, canned (in natural juice), or frozen fruits. Meats and other protein foods   Ground beef (85% or leaner), grass-fed beef, or beef trimmed of fat. Skinless chicken or [place]. Ground chicken or [place]. Pork trimmed of fat. All fish and seafood. Eggs. Dried beans, peas, or lentils. Unsalted nuts or seeds. Unsalted canned or dry beans. Dairy   Low-fat dairy products, such as skim or 1% milk, 2% or reduced-fat cheeses, low-fat ricotta or cottage cheese, or plain low-fat yo Fats and oils   Tub margarines without trans fats. Light or reduced-fat mayonnaise and salad dressings. Avocado. Olive, canola, sesame, or safflower oils. Natural peanut or almond butter (choose ones without added sugar and oil). The items listed above may not be a complete list of recommended foods or beverages. Contact your dietitian for more options.  Foods to avoid Grains   White bread. White pasta. White rice. Cornbread. Bagels, pastries, and croissants. Crackers that contain trans fat. Vegetables   White potatoes. Corn. Creamed or fried vegetables. Vegetables in a cheese sauce. Fruits   Dried fruits. Canned fruit in light or heavy syrup. Fruit juice. Meats and other protein foods   Fatty cuts of meat. Ribs, chicken wings, bacon, sausage, bologna, salami, chitterlings, fatback, hot dogs, bratwurst, and packaged luncheon meats. Liver and organ meats. Dairy   Whole or 2% milk, cream, half-and-half, and cream cheese.  Whole milk cheeses. Whole-fat or sweetened yogurt. Full-fat cheeses. Nondairy creamers and whipped toppings. Processed cheese, cheese spreads, or cheese curds. Beverages   Alcohol. Sweetened drinks (such as sodas, lemonade, and fruit drinks or punches). Fats and oils   Butter, stick margarine, lard, shortening, ghee, or bacon fat. Coconut, palm kernel, or palm oils. Sweets and desserts   Corn syrup, sugars, honey, and molasses. Candy. Jam and jelly. Syrup. Sweetened cereals. Cookies, pies, cakes, donuts, muffins, and ice cream. The items listed above may not be a complete list of foods and beverages to avoid. Contact your dietitian for more information.  This information is not intended to replace advice given to you by your health care provider. Make sure you discuss any questions you have with your health care provider. Document Released: 09/09/2005 Document Revised: 09/30/2014 Document Reviewed: 12/08/2013 Elsevier Interactive Patient Education  2017 [employer] Diabetes Mellitus and Food It is important for you to manage your blood sugar (glucose) level. Your blood glucose level can be greatly affected by what you eat. Eating healthier foods in the appropriate amounts throughout the day at about the same  time each day will help you control your blood glucose level. It can also help slow or prevent worsening of your diabetes mellitus. Healthy eating may even help you improve the level of your blood pressure and reach or maintain a healthy weight. General recommendations for healthful eating and cooking habits include:  Eating meals and snacks regularly. Avoid going long periods of time without eating to lose weight.  Eating a diet that consists mainly of plant-based foods, such as fruits, vegetables, nuts, legumes, and whole grains.  Using low-heat cooking methods, such as baking, instead of high-heat cooking methods, such as deep frying. Work with your dietitian to make sure you  understand how to use the Nutrition Facts information on food labels. How can food affect me? Carbohydrates  Carbohydrates affect your blood glucose level more than any other type of food. Your dietitian will help you determine how many carbohydrates to eat at each meal and teach you how to count carbohydrates. Counting carbohydrates is important to keep your blood glucose at a healthy level, especially if you are using insulin or taking certain medicines for diabetes mellitus. Alcohol  Alcohol can cause sudden decreases in blood glucose (hypoglycemia), especially if you use insulin or take certain medicines for diabetes mellitus. Hypoglycemia can be a life-threatening condition. Symptoms of hypoglycemia (sleepiness, dizziness, and disorientation) are similar to symptoms of having too much alcohol. If your health care provider has given you approval to drink alcohol, do so in moderation and use the following guidelines:  Women should not have more than one drink per day, and men should not have more than two drinks per day. One drink is equal to:  12 oz of beer.  5 oz of wine.  1 oz of hard liquor.  Do not drink on an empty stomach.  Keep yourself hydrated. Have water, diet soda, or unsweetened iced tea.  Regular soda, juice, and other mixers might contain a lot of carbohydrates and should be counted. What foods are not recommended? As you make food choices, it is important to remember that all foods are not the same. Some foods have fewer nutrients per serving than other foods, even though they might have the same number of calories or carbohydrates. It is difficult to get your body what it needs when you eat foods with fewer nutrients. Examples of foods that you should avoid that are high in calories and carbohydrates but low in nutrients include:  Trans fats (most processed foods list trans fats on the Nutrition Facts label).  Regular soda.  Juice.  Candy.  Sweets, such as cake,  pie, doughnuts, and cookies.  Fried foods. What foods can I eat? Eat nutrient-rich foods, which will nourish your body and keep you healthy. The food you should eat also will depend on several factors, including:  The calories you need.  The medicines you take.  Your weight.  Your blood glucose level.  Your blood pressure level.  Your cholesterol level. You should eat a variety of foods, including:  Protein.  Lean cuts of meat.  Proteins low in saturated fats, such as fish, egg whites, and beans. Avoid processed meats.  Fruits and vegetables.  Fruits and vegetables that may help control blood glucose levels, such as apples, mangoes, and yams.  Dairy products.  Choose fat-free or low-fat dairy products, such as milk, yogurt, and cheese.  Grains, bread, pasta, and rice.  Choose whole grain products, such as multigrain bread, whole oats, and brown rice. These foods may help  control blood pressure.  Fats.  Foods containing healthful fats, such as nuts, avocado, olive oil, canola oil, and fish. Does everyone with diabetes mellitus have the same meal plan? Because every person with diabetes mellitus is different, there is not one meal plan that works for everyone. It is very important that you meet with a dietitian who will help you create a meal plan that is just right for you. This information is not intended to replace advice given to you by your health care provider. Make sure you discuss any questions you have with your health care provider. Document Released: 06/06/2005 Document Revised: 02/15/2016 Document Reviewed: 08/06/2013 Elsevier Interactive Patient Education  2017 [employer].

## 2016-12-11 ENCOUNTER — Encounter: Payer: Self-pay | Admitting: Family Medicine

## 2016-12-11 LAB — CMP14+EGFR
A/G RATIO: 1.1 — AB (ref 1.2–2.2)
ALK PHOS: 117 IU/L (ref 39–117)
ALT: 22 IU/L (ref 0–44)
AST: 17 IU/L (ref 0–40)
Albumin: 4 g/dL (ref 3.5–5.5)
BILIRUBIN TOTAL: 0.3 mg/dL (ref 0.0–1.2)
BUN/Creatinine Ratio: 28 — ABNORMAL HIGH (ref 9–20)
BUN: 20 mg/dL (ref 6–20)
CHLORIDE: 99 mmol/L (ref 96–106)
CO2: 24 mmol/L (ref 18–29)
Calcium: 9.6 mg/dL (ref 8.7–10.2)
Creatinine, Ser: 0.72 mg/dL — ABNORMAL LOW (ref 0.76–1.27)
GFR calc non Af Amer: 118 mL/min/{1.73_m2} (ref >59)
GFR, EST AFRICAN AMERICAN: 137 mL/min/{1.73_m2} (ref >59)
Globulin, Total: 3.6 g/dL (ref 1.5–4.5)
Glucose: 227 mg/dL — ABNORMAL HIGH (ref 65–99)
POTASSIUM: 4.8 mmol/L (ref 3.5–5.2)
Sodium: 137 mmol/L (ref 134–144)
TOTAL PROTEIN: 7.6 g/dL (ref 6.0–8.5)

## 2016-12-11 LAB — RPR: RPR Ser Ql: NONREACTIVE

## 2016-12-11 LAB — HIV ANTIBODY (ROUTINE TESTING W REFLEX): HIV Screen 4th Generation wRfx: NONREACTIVE

## 2016-12-11 LAB — LIPID PANEL
Chol/HDL Ratio: 5.2 ratio units — ABNORMAL HIGH (ref 0.0–5.0)
Cholesterol, Total: 176 mg/dL (ref 100–199)
HDL: 34 mg/dL — ABNORMAL LOW (ref >39)
LDL Calculated: 87 mg/dL (ref 0–99)
Triglycerides: 274 mg/dL — ABNORMAL HIGH (ref 0–149)
VLDL Cholesterol Cal: 55 mg/dL — ABNORMAL HIGH (ref 5–40)

## 2016-12-11 NOTE — Progress Notes (Signed)
Subjective:    Patient ID: Michael Foster, male    DOB: 1976/05/08, 41 y.o.   MRN: [id number]   CC: Establishing care with physician   HPI: Michael Foster is a 41 yo male with a past medical history significant for T2DM, HTN, HLD, obesity, and asthma who is transfer care to our clinic. Patient does not have any specific complaints at the moment. 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 Patient admits to non adherence to medication regimen. He has recently started work out and have been checking his blood glucose on a daily basis with noted improvement. Patient is transferring car from [employer].   Smoking status reviewed   ROS: all other systems were reviewed and are negative other than in the HPI   Past Medical History:  Diagnosis Date  . ADD (attention deficit disorder)   . Asthma    as a child  . Diabetes mellitus Dx 2009  . Gastroenteritis   . H/O TIA (transient ischemic attack) and stroke 01/04/2015   06/2014 with L hand numbness   . Hypertension Dx 2009  . Vertigo    Past Surgical History:  Procedure Laterality Date  . MOUTH SURGERY       Objective:  BP (!) 150/76   Pulse 98   Temp 98.7 F (37.1 C) (Oral)   Wt (!) 145.8 kg (321 lb 6.4 oz)   SpO2 97%   BMI 47.46 kg/m   Vitals and nursing note reviewed  General: Obese but pleasant male, able to participate in exam Cardiac: RRR, normal heart sounds, no murmurs. 2+ radial and PT pulses bilaterally Respiratory: CTAB, normal effort, No wheezes, rales or rhonchi Abdomen: soft, nontender, nondistended, no hepatic or splenomegaly, +BS Extremities: no edema or cyanosis. WWP. Skin: warm and dry, no rashes noted Neuro: alert and oriented x4, no focal deficits Psych: Normal affect and mood   Assessment & Plan:   #T2DM, chronic, uncontrolled Patient has a glucometer and has been checking more regularly in the past two months with BG averaging mid 200's. Patient biggest problem  has been diet and to some degree medications non adherence. Patient has also started a new exercise regimen and is feeling better. Last A1c on record in 2016 was 9.5. Will reevaluate patient medication regimen based on today's lab results. --Will order A1c --CMP to assess kidney function --Continue Metformin 1000 mg daily --Continue Victoza 1.8 mg daily --BG check at home  #Hypertension, chronic  Patient currently on 2.5 mg of lisinopril. BP today is 150/76. Dosage is too low given patient obesity, T2DM and HLD. Patient has been in an exercise program for the past two month has lost some weight and appeared motivated to improve his health. Will titrate patient ACE-I beneficial with his T2DM and possibly add CCB given ethnicity and proven benefit in lowering BP. --Will increase lisinopril to 5 mg daily --Will have patient schedule a BP check visit --Consider adding either CCB or thiazide based on results in addition to ACE  #Hyperlipidemia Patient is not currently on statin therapy despite T2DM, HTN and HLD. Previous lipid panel in 2016 showed a cholesterol of 209, a LDL of 101 and HDL of 29. Discussed changes in dietary habits and new exercise program. Patient being younger than 40 ASCVD would not be useful in guiding therapy, however given multiple comorbidities, will strongly consider starting statin therapy based on result from labwork. --Will order lipid panel --Reduced cholesterol diet handout given --Encourage  lifestyle changes --Will reassess statin therapy at next visit  #Asthma, chronic Well controlled with no recent attacks. Patient quit smoking 3 years ago and has been doing well from that standpoint.  #Sleep apnea Patient has been using a CPAP machine at night for many years and is followed by sleep physician in the outpatient setting. --Will continue to monitor  #STD panel Patient requested STD testing as he just started ended a relationship and would like to be checked for  STD. --Order, HIV, RPR, and GC/CH   Lovena NeighboursAbdoulaye Electra Paladino, MD Family Medicine Resident PGY-1

## 2016-12-12 ENCOUNTER — Encounter: Payer: Self-pay | Admitting: Family Medicine

## 2016-12-12 LAB — URINE CYTOLOGY ANCILLARY ONLY
Chlamydia: NEGATIVE
Neisseria Gonorrhea: NEGATIVE

## 2016-12-16 ENCOUNTER — Other Ambulatory Visit: Payer: Self-pay | Admitting: *Deleted

## 2016-12-16 MED ORDER — ALBUTEROL SULFATE HFA 108 (90 BASE) MCG/ACT IN AERS: 1.0000 | INHALATION_SPRAY | Freq: Four times a day (QID) | RESPIRATORY_TRACT | 0 refills | Status: DC | PRN

## 2016-12-17 DIAGNOSIS — G4733 Obstructive sleep apnea (adult) (pediatric): Secondary | ICD-10-CM | POA: Diagnosis not present

## 2017-01-03 ENCOUNTER — Ambulatory Visit (INDEPENDENT_AMBULATORY_CARE_PROVIDER_SITE_OTHER): Payer: 59 | Admitting: Pulmonary Disease

## 2017-01-03 ENCOUNTER — Encounter: Payer: Self-pay | Admitting: Pulmonary Disease

## 2017-01-03 VITALS — BP 124/80 | HR 92 | Ht 69.0 in | Wt 320.0 lb

## 2017-01-03 DIAGNOSIS — G4733 Obstructive sleep apnea (adult) (pediatric): Secondary | ICD-10-CM

## 2017-01-03 DIAGNOSIS — Z9989 Dependence on other enabling machines and devices: Secondary | ICD-10-CM

## 2017-01-03 DIAGNOSIS — J45909 Unspecified asthma, uncomplicated: Secondary | ICD-10-CM

## 2017-01-03 MED ORDER — ALBUTEROL SULFATE HFA 108 (90 BASE) MCG/ACT IN AERS: 1.0000 | INHALATION_SPRAY | Freq: Four times a day (QID) | RESPIRATORY_TRACT | 3 refills | Status: DC | PRN

## 2017-01-03 MED FILL — VENTOLIN HFA 90 MCG INHALER: 108 (90 BAS | 25 days supply | Qty: 18 | Fill #0

## 2017-01-03 NOTE — Patient Instructions (Signed)
Prescription for auto CPAP 12-20 cm with menorrhagia Quattro fullface mask and new CPAP supplies will be sent to new DME (NOT [facility] )  Based on this, we will set you for sleep study if required  Refills on albuterol MDI

## 2017-01-03 NOTE — Assessment & Plan Note (Signed)
Refills on albuterol will be provided. He does not seem to need inhaled steroids

## 2017-01-03 NOTE — Assessment & Plan Note (Signed)
Prescription for auto CPAP 12-20 cm with menorrhagia Quattro fullface mask and new CPAP supplies will be sent to new DME (NOT [facility] )  I do not feel a need to repeat a sleep study here. We'll review his download unchanged CPAP settings as required  Weight loss encouraged, compliance with goal of at least 4-6 hrs every night is the expectation. Advised against medications with sedative side effects Cautioned against driving when sleepy - understanding that sleepiness will vary on a day to day basis

## 2017-01-03 NOTE — Addendum Note (Signed)
Addended by: Maurene Capes on: 01/03/2017 10:34 AM   Modules accepted: Orders

## 2017-01-03 NOTE — Progress Notes (Signed)
Subjective:    Patient ID: Michael Foster, male    DOB: November 23, 1975, 41 y.o.   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  He reports asthma since childhood, never hospitalised, worse during spring, uses albuterol MDI more frequently during the spring. Is currently out of her prescription and needs that. Spirometry in 2011 showed decreased FEV1 with moderate airway obstruction. He was diagnosed with severe obstructive sleep apnea after a sleep study  in 2005 & has been on cpap ever since, cannot tolerate a single night off it.  He had his father's and then his brother's CPAP machine since 2004 and settings were changed. He now wonders if he can have his own machine. He had problems with DME and would like to change DME  CPAP currently set at 14 cm, titration study had showed that this level corrects events but needs 19 cm to abolish snoring. Records asleep on his upper watch which shows that he only gets 3-4 hours of sleep per night. He does feel more sleepy during the daytime  He has been started on stimulants for ADHD He reports increased itchiness and redness of eyes and nasal congestion and a dry cough for which she takes Zyrtec over-the-counter Spirometry today showed no airway obstruction with FEV1 of 77%  Significant tests/ events  06/2003 Baseline PSG at [facility]  , wt 352, BMI 53 showed RDI 30/h, corrected by CPAP 9 cm  02/2010 CPAP titration >> 15-19 cm   Spirometry 02/2010  FEv1 50%, pos BD response   Past Medical History:  Diagnosis Date  . ADD (attention deficit disorder)   . Asthma    as a child  . Diabetes mellitus Dx 2009  . Gastroenteritis   . H/O TIA (transient ischemic attack) and stroke 01/04/2015   06/2014 with L hand numbness   . Hypertension Dx 2009  . Vertigo      Past Surgical History:  Procedure Laterality Date  . MOUTH SURGERY       Allergies  Allergen Reactions  . Slo-Bid Gyrocaps [Theophylline] Other (See Comments)    Unknown childhood reaction     Social History   Social History  . Marital status: Legally Separated    Spouse name: N/A  . Number of children: 2  . Years of education: N/A   Occupational History  . IT [employer]   Social History Main Topics  . Smoking status: Former Smoker    Packs/day: 0.30    Years: 10.00    Types: Cigarettes    Quit date: 05/28/2013  . Smokeless tobacco: Never Used  . Alcohol use No  . Drug use: No  . Sexual activity: Not on file   Other Topics Concern  . Not on file   Social History Narrative  . No narrative on file     Family History  Problem Relation Age of Onset  . COPD Father   . Hypertension Father   . Heart failure Father   . Diabetes Father   . Diabetes Mother   . Hyperlipidemia Mother   . Sleep apnea Brother   . Hypertension Brother      Review of Systems Constitutional: negative for anorexia, fevers and sweats  Eyes: negative for irritation, redness and visual disturbance  Ears, nose, mouth, throat, and face: negative for earaches, epistaxis and sore throat  Respiratory: negative for  dyspnea on exertion, sputum and  wheezing  Cardiovascular: negative for chest pain, dyspnea, lower extremity edema, orthopnea, palpitations and syncope  Gastrointestinal: negative for abdominal pain, constipation, diarrhea, melena, nausea and vomiting  Genitourinary:negative for dysuria, frequency and hematuria  Hematologic/lymphatic: negative for bleeding, easy bruising and lymphadenopathy  Musculoskeletal:negative for arthralgias, muscle weakness and stiff joints  Neurological: negative for coordination problems, gait problems, headaches and weakness  Endocrine: negative for diabetic symptoms including polydipsia, polyuria and weight loss     Objective:   Physical Exam  Gen. Pleasant, obese, in no distress, normal affect ENT - no lesions, no post  nasal drip, class 3 airway Neck: No JVD, no thyromegaly, no carotid bruits Lungs: no use of accessory muscles, no dullness to percussion, decreased without rales or rhonchi  Cardiovascular: Rhythm regular, heart sounds  normal, no murmurs or gallops, no peripheral edema Abdomen: soft and non-tender, no hepatosplenomegaly, BS normal. Musculoskeletal: No deformities, no cyanosis or clubbing Neuro:  alert, non focal, no tremors       Assessment & Plan:

## 2017-01-06 ENCOUNTER — Telehealth: Payer: Self-pay | Admitting: Pulmonary Disease

## 2017-01-06 NOTE — Telephone Encounter (Signed)
Will forward to [facility] basket-it appears Chantel faxed all need information on Friday 01-03-17. Thanks

## 2017-01-07 NOTE — Telephone Encounter (Signed)
printed and faxed records to [facility]

## 2017-01-13 ENCOUNTER — Other Ambulatory Visit: Payer: Self-pay | Admitting: *Deleted

## 2017-01-13 DIAGNOSIS — G4733 Obstructive sleep apnea (adult) (pediatric): Secondary | ICD-10-CM | POA: Diagnosis not present

## 2017-01-13 DIAGNOSIS — Z9989 Dependence on other enabling machines and devices: Secondary | ICD-10-CM | POA: Diagnosis not present

## 2017-01-13 MED ORDER — LIRAGLUTIDE 18 MG/3ML ~~LOC~~ SOPN: 1.8000 mg | PEN_INJECTOR | Freq: Every day | SUBCUTANEOUS | 0 refills | Status: DC

## 2017-01-13 MED ORDER — INSULIN PEN NEEDLE 31G X 8 MM MISC: 0 refills | Status: DC

## 2017-01-14 MED FILL — VICTOZA 18 MG/3 ML INJECT P: 18 | 30 days supply | Qty: 9 | Fill #0

## 2017-01-14 MED FILL — UNIFINE PENTIPS 8MM 31G: 31G X 8 MM | 90 days supply | Qty: 100 | Fill #0

## 2017-01-15 ENCOUNTER — Emergency Department (HOSPITAL_BASED_OUTPATIENT_CLINIC_OR_DEPARTMENT_OTHER)
Admission: EM | Admit: 2017-01-15 | Discharge: 2017-01-15 | Disposition: A | Discharge status: Home / Self Care | Payer: 59 | Attending: Emergency Medicine | Admitting: Emergency Medicine

## 2017-01-15 ENCOUNTER — Emergency Department (HOSPITAL_BASED_OUTPATIENT_CLINIC_OR_DEPARTMENT_OTHER): Payer: 59

## 2017-01-15 ENCOUNTER — Encounter (HOSPITAL_BASED_OUTPATIENT_CLINIC_OR_DEPARTMENT_OTHER): Payer: Self-pay

## 2017-01-15 DIAGNOSIS — I10 Essential (primary) hypertension: Secondary | ICD-10-CM | POA: Diagnosis not present

## 2017-01-15 DIAGNOSIS — E119 Type 2 diabetes mellitus without complications: Secondary | ICD-10-CM | POA: Diagnosis not present

## 2017-01-15 DIAGNOSIS — Z87891 Personal history of nicotine dependence: Secondary | ICD-10-CM | POA: Insufficient documentation

## 2017-01-15 DIAGNOSIS — R05 Cough: Secondary | ICD-10-CM | POA: Diagnosis not present

## 2017-01-15 DIAGNOSIS — R0602 Shortness of breath: Secondary | ICD-10-CM | POA: Diagnosis not present

## 2017-01-15 DIAGNOSIS — J45901 Unspecified asthma with (acute) exacerbation: Secondary | ICD-10-CM | POA: Diagnosis not present

## 2017-01-15 DIAGNOSIS — Z7984 Long term (current) use of oral hypoglycemic drugs: Secondary | ICD-10-CM | POA: Diagnosis not present

## 2017-01-15 DIAGNOSIS — Z79899 Other long term (current) drug therapy: Secondary | ICD-10-CM | POA: Insufficient documentation

## 2017-01-15 DIAGNOSIS — Z794 Long term (current) use of insulin: Secondary | ICD-10-CM | POA: Diagnosis not present

## 2017-01-15 MED ORDER — PREDNISONE 10 MG PO TABS: 20.0000 mg | ORAL_TABLET | Freq: Every day | ORAL | 0 refills | Status: DC

## 2017-01-15 MED ORDER — IPRATROPIUM-ALBUTEROL 0.5-2.5 (3) MG/3ML IN SOLN
3.0000 mL | Freq: Four times a day (QID) | RESPIRATORY_TRACT | Status: DC
  Administered 2017-01-15: 3 mL via RESPIRATORY_TRACT
  Filled 2017-01-15: qty 3

## 2017-01-15 NOTE — ED Provider Notes (Signed)
[facility] Provider Note   CSN: [id number] Arrival date & time: 01/15/17  1355     History   Chief Complaint Chief Complaint  Patient presents with  . Shortness of Breath    HPI Michael Foster is a 41 y.o. male with past medical history of asthma who presents with 1 week of worsening shortness of breath. Patient states that symptoms began one week ago when the pollen count was high. He reports a history of seasonal allergies that are worsened with the high pollen count. Patient states that he has been using his albuterol inhaler multiple times a day with temporary relief. He last used his albuterol inhaler prior to coming to the emergency department. Patient also reports history of nonproductive cough that has been going on for the same duration. He states that he has a history of asthma and that his triggers are seasonal allergies. He denies any recent sickness or fevers. He denies any chest pain. He denies any history of blood clots, recent travel, recent immobilization, recent surgery, leg swelling, hemoptysis. He was a former smoker but has since quit.  The history is provided by the patient.    Past Medical History:  Diagnosis Date  . ADD (attention deficit disorder)   . Asthma    as a child  . Diabetes mellitus Dx 2009  . Gastroenteritis   . H/O TIA (transient ischemic attack) and stroke 01/04/2015   06/2014 with L hand numbness   . Hypertension Dx 2009  . Vertigo     Patient Active Problem List   Diagnosis Date Noted  . Chest pain 07/06/2015  . Dyspnea 07/06/2015  . Screening for STD (sexually transmitted disease) 06/09/2015  . H/O TIA (transient ischemic attack) and stroke 01/04/2015  . Diabetes (HCC) 06/10/2013  . Morbid obesity (HCC) 06/10/2013  . Asthma, chronic 06/10/2013  . Backache 03/22/2010  . HYPERLIPIDEMIA 01/17/2010  . Essential hypertension 01/17/2010  . OSA on CPAP 01/17/2010    Past Surgical History:  Procedure Laterality Date  .  MOUTH SURGERY         Home Medications    Prior to Admission medications   Medication Sig Start Date End Date Taking? Authorizing Provider  albuterol (VENTOLIN HFA) 108 (90 Base) MCG/ACT inhaler Inhale 1-2 puffs into the lungs every 6 (six) hours as needed for wheezing or shortness of breath. 01/03/17  Yes Oretha Milch, MD  Calcium Carbonate Antacid (ALKA-SELTZER ANTACID PO) Take 1 tablet by mouth daily as needed.   Yes Historical Provider, MD  cetirizine (ZYRTEC) 10 MG tablet Take 10 mg by mouth daily as needed for allergies.   Yes Historical Provider, MD  cyclobenzaprine (FLEXERIL) 10 MG tablet Take 1 tablet (10 mg total) by mouth 3 (three) times daily as needed for muscle spasms. 07/14/15  Yes Josalyn Funches, MD  docusate sodium (COLACE) 100 MG capsule Take 1 capsule (100 mg total) by mouth every 12 (twelve) hours. 03/13/16  Yes Zadie Rhine, MD  glucose blood (ACCU-CHEK GUIDE) test strip Use as instructed 12/10/16  Yes Abdoulaye Diallo, MD  Insulin Pen Needle (UNIFINE PENTIPS) 31G X 8 MM MISC See below 01/13/17  Yes Abdoulaye Diallo, MD  liraglutide (VICTOZA) 18 MG/3ML SOPN Inject 0.3 mLs (1.8 mg total) into the skin daily. 01/13/17  Yes Abdoulaye Diallo, MD  lisdexamfetamine (VYVANSE) 30 MG capsule Take 30 mg by mouth daily.   Yes Historical Provider, MD  lisinopril (PRINIVIL,ZESTRIL) 2.5 MG tablet Take 1 tablet (2.5 mg total) by mouth daily.  01/04/15  Yes Josalyn Funches, MD  metFORMIN (GLUCOPHAGE) 500 MG tablet Take 2 tablets (1,000 mg total) by mouth 2 (two) times daily with a meal. Patient taking differently: Take 1,000 mg by mouth daily.  03/09/15  Yes Josalyn Funches, MD  metFORMIN (GLUCOPHAGE-XR) 500 MG 24 hr tablet Take 500 mg by mouth daily with breakfast.     Historical Provider, MD  predniSONE (DELTASONE) 10 MG tablet Take 2 tablets (20 mg total) by mouth daily. 01/15/17   Maxwell Caul, PA-C    Family History Family History  Problem Relation Age of Onset  . COPD Father     . Hypertension Father   . Heart failure Father   . Diabetes Father   . Diabetes Mother   . Hyperlipidemia Mother   . Sleep apnea Brother   . Hypertension Brother     Social History Social History  Substance Use Topics  . Smoking status: Former Smoker    Packs/day: 0.30    Years: 10.00    Types: Cigarettes    Quit date: 05/28/2013  . Smokeless tobacco: Never Used  . Alcohol use No     Allergies   Slo-bid gyrocaps [theophylline]   Review of Systems Review of Systems  Constitutional: Negative for fever.  Respiratory: Positive for cough and shortness of breath.   Cardiovascular: Negative for chest pain.  Gastrointestinal: Negative for nausea and vomiting.  Neurological: Negative for headaches.  All other systems reviewed and are negative.    Physical Exam Updated Vital Signs BP (!) 142/75 (BP Location: Left Arm)   Pulse 93   Temp 98.3 F (36.8 C) (Oral)   Resp (!) 21   Ht  (1.753 m)   Wt (!) 141.5 kg   SpO2 98%   BMI 46.07 kg/m   Physical Exam  Constitutional: He appears well-developed and well-nourished.  HENT:  Head: Normocephalic and atraumatic.  Pale, boggy nasal mucosa  Eyes: Conjunctivae and EOM are normal. Pupils are equal, round, and reactive to light. Right eye exhibits no discharge. Left eye exhibits no discharge. No scleral icterus.  Cardiovascular: Regular rhythm and intact distal pulses.  Tachycardia present.   Pulmonary/Chest: Effort normal and breath sounds normal. No accessory muscle usage. No respiratory distress. He has no wheezes.  Good air movement throughout lungs. Able speak in full sentences without difficulty. No respiratory distress, accessory muscle usage. No wheezes or stridor noted on exam.  Musculoskeletal: He exhibits no deformity.  No lower extremity edema  Neurological: He is alert.  Skin: Skin is warm and dry.  Psychiatric: He has a normal mood and affect. His speech is normal and behavior is normal.     ED  Treatments / Results  Labs (all labs ordered are listed, but only abnormal results are displayed) Labs Reviewed - No data to display  EKG  EKG Interpretation  Date/Time:  Wednesday January 15 2017 14:03:03 EDT Ventricular Rate:  91 PR Interval:  156 QRS Duration: 94 QT Interval:  342 QTC Calculation: 420 R Axis:   93 Text Interpretation:  Normal sinus rhythm Rightward axis T wave abnormality, consider inferior ischemia Abnormal ECG No STEMI.  Confirmed by LONG MD, JOSHUA [phone]) on 01/15/2017 2:20:07 PM       Radiology Dg Chest 2 View  Result Date: 01/15/2017 CLINICAL DATA:  Progressive shortness of breath, worsening today. Cough. EXAM: CHEST  2 VIEW COMPARISON:  Two-view chest x-ray 09/17/2016 FINDINGS: The heart size is normal. Moderate pulmonary vascular congestion is noted. There is  no frank edema. There are no effusions. No significant airspace consolidation is present. The visualized soft tissues and bony thorax are unremarkable. IMPRESSION: 1. Moderate pulmonary vascular congestion without frank edema. 2. No other acute cardiopulmonary disease. Electronically Signed   By: Marin Roberts M.D.   On: 01/15/2017 14:40    Procedures Procedures (including critical care time)  Medications Ordered in ED Medications  ipratropium-albuterol (DUONEB) 0.5-2.5 (3) MG/3ML nebulizer solution 3 mL (3 mLs Nebulization Given 01/15/17 1448)     Initial Impression / Assessment and Plan / ED Course  I have reviewed the triage vital signs and the nursing notes.  Pertinent labs & imaging results that were available during my care of the patient were reviewed by me and considered in my medical decision making (see chart for details).    41 year old male presents with history of shortness of breath 1 week. Associated with nonproductive cough. Patient has history of asthma and states that he has had use his rescue inhaler more frequently. He reports multiple uses per day. Physical exam and no  wheezing or stridor on lungs. No signs of respiratory distress. Vital signs reviewed and stable. Heart rate increased likely a result of albuterol use prior to coming to the emergency department. Oxygen levels >95% on RA.  Consider asthma exacerbation versus bronchitis versus acute infectious etiology. Patient is at low risk for PE given lack of risk factors and history/exam.  EKG ordered at triage. Normal sinus rhythm rate 91. Will get a chest x-ray today for eval of any infectious etiology.   X-ray reviewed. Negative for any acute infectious process. Will give patient DuoNeb treatment in the emergency department for symptomatic relief.  Re-evaluation: Patient reports  improvement in symptoms after nebulizer treatment. Repeat exam. Patient's lungs with no wheezing or stridor. He is having no difficulty speaking in full sentences. No shortness of breath noted. Heart rate is within normal limits. Initial tachycardia likely due to albuterol use prior to coming to the emergency department. O2 sats have remained >95% throughout ED course. Symptoms likely a result of asthma exacerbation given history and improvement of symptoms after nebulizer treatment.  Plan to treat for asthma exacerbation. Instructed patient to call his primary care doctor and arrange for a follow-up appointment in the next 24-48 hours. Also instructed patient to follow-up with his pulmonologist. Patient just refilled his albuterol inhaler does not need any new prescription for that. We'll send him out on prednisone for further symptomatic relief. Strict return precautions discussed. Patient reports understanding and agreement to plan.    Final Clinical Impressions(s) / ED Diagnoses   Final diagnoses:  Exacerbation of asthma, unspecified asthma severity, unspecified whether persistent    New Prescriptions Discharge Medication List as of 01/15/2017  4:10 PM    START taking these medications   Details  predniSONE (DELTASONE) 10 MG  tablet Take 2 tablets (20 mg total) by mouth daily., Starting Wed 01/15/2017, Print         Maxwell Caul, PA-C 01/15/17 1638    Maxwell Caul, PA-C 01/15/17 1648    Maxwell Caul, PA-C 01/15/17 1755    Maia Plan, MD 01/15/17 Mikle Bosworth

## 2017-01-15 NOTE — Discharge Instructions (Signed)
Follow up with her primary care doctor in 24-48 hours. If he cannot be seen by them follow up with her pulmonologist for further evaluation of asthma. He may need to have additional therapy on board for asthma.   Continue taking allergy medications for help with symptomatic relief.  Take the steroids as directed to help with symptoms.  Return to the emergency department for any  worsening difficult to breathing, chest pain, any worsening or concerning symptoms.  If you do not have a primary care doctor you see regularly, please you the list below. Please call them to arrange for follow-up.    No Primary Care Doctor Call [facility]  [phone] Other agencies that provide inexpensive medical care    [facility]  [phone]    [facility]  [phone]    [facility]  [phone]    [facility]  [phone]    [facility]  [phone]    [facility]  [phone]

## 2017-01-15 NOTE — ED Notes (Signed)
Assumed care of patient from [place], [place]. PT awaiting disposition from EDP.

## 2017-01-15 NOTE — ED Triage Notes (Signed)
Pt reports 2 week history of progressive shortness of breath that worsened today, history of asthma, no wheezing auscultated in triage, resp even, non labored. Associated nonproductive cough.

## 2017-01-16 MED FILL — predniSONE 10 MG TABS: 10 | 6 days supply | Qty: 12 | Fill #0

## 2017-02-05 ENCOUNTER — Encounter (HOSPITAL_BASED_OUTPATIENT_CLINIC_OR_DEPARTMENT_OTHER): Payer: Self-pay | Admitting: *Deleted

## 2017-02-05 ENCOUNTER — Emergency Department (HOSPITAL_BASED_OUTPATIENT_CLINIC_OR_DEPARTMENT_OTHER)
Admission: EM | Admit: 2017-02-05 | Discharge: 2017-02-05 | Disposition: A | Discharge status: Home / Self Care | Payer: 59 | Attending: Dermatology | Admitting: Dermatology

## 2017-02-05 DIAGNOSIS — S99921A Unspecified injury of right foot, initial encounter: Secondary | ICD-10-CM | POA: Diagnosis present

## 2017-02-05 DIAGNOSIS — Y929 Unspecified place or not applicable: Secondary | ICD-10-CM | POA: Insufficient documentation

## 2017-02-05 DIAGNOSIS — T1490XA Injury, unspecified, initial encounter: Secondary | ICD-10-CM | POA: Insufficient documentation

## 2017-02-05 DIAGNOSIS — Z5321 Procedure and treatment not carried out due to patient leaving prior to being seen by health care provider: Secondary | ICD-10-CM | POA: Insufficient documentation

## 2017-02-05 DIAGNOSIS — W1849XA Other slipping, tripping and stumbling without falling, initial encounter: Secondary | ICD-10-CM | POA: Diagnosis not present

## 2017-02-05 DIAGNOSIS — Y999 Unspecified external cause status: Secondary | ICD-10-CM | POA: Diagnosis not present

## 2017-02-05 DIAGNOSIS — Y939 Activity, unspecified: Secondary | ICD-10-CM | POA: Diagnosis not present

## 2017-02-05 NOTE — ED Triage Notes (Signed)
States that he stubbed his little toe on right foot today. Denies pain. Reports bruising. Ambulatory.

## 2017-02-12 DIAGNOSIS — G4733 Obstructive sleep apnea (adult) (pediatric): Secondary | ICD-10-CM | POA: Diagnosis not present

## 2017-02-12 DIAGNOSIS — Z9989 Dependence on other enabling machines and devices: Secondary | ICD-10-CM | POA: Diagnosis not present

## 2017-02-18 ENCOUNTER — Other Ambulatory Visit (HOSPITAL_COMMUNITY)
Admission: RE | Admit: 2017-02-18 | Discharge: 2017-02-18 | Disposition: A | Discharge status: Home / Self Care | Payer: 59 | Source: Ambulatory Visit | Attending: Family Medicine | Admitting: Family Medicine

## 2017-02-18 ENCOUNTER — Ambulatory Visit (INDEPENDENT_AMBULATORY_CARE_PROVIDER_SITE_OTHER): Payer: 59 | Admitting: Family Medicine

## 2017-02-18 ENCOUNTER — Encounter: Payer: Self-pay | Admitting: Family Medicine

## 2017-02-18 VITALS — BP 126/80 | HR 78 | Temp 98.6°F | Ht 69.0 in | Wt 315.0 lb

## 2017-02-18 DIAGNOSIS — Z113 Encounter for screening for infections with a predominantly sexual mode of transmission: Secondary | ICD-10-CM | POA: Diagnosis not present

## 2017-02-18 DIAGNOSIS — R7309 Other abnormal glucose: Secondary | ICD-10-CM | POA: Diagnosis not present

## 2017-02-18 NOTE — Progress Notes (Signed)
   Subjective:    Patient ID: Michael Foster, male    DOB: 07/31/1976, 41 y.o.   MRN: [id number]   CC: Right toe pain   HPI: Patient is a 41 yo male with a past medical history significant for T2DM, HTN,HLD and asthma who presents today to clinic to discus:  Right foot fifth digit pain: Patient reports about a week ago, he hit little toe on his right foot on the corner of the table and that he fractured it. He reports severe pain and difficulty ambulating initially. He went to the emergency room to get an Xray but after long wait in the waiting room decided to go. Patient noticed a small hematoma afterwards, but was able to regain full motility of his toes, he is able to work without any pain and does not have concerns about fracture or sprain anymore  Unprotected intercourse with concern for STD: Patient reports having unprotected sexual relationship with a new male partner about two weeks ago and since then have been wanting to get tested to STD.    Smoking status reviewed   ROS: all other systems were reviewed and are negative other than in the HPI   Past Medical History:  Diagnosis Date  . ADD (attention deficit disorder)   . Asthma    as a child  . Diabetes mellitus Dx 2009  . Gastroenteritis   . H/O TIA (transient ischemic attack) and stroke 01/04/2015   06/2014 with L hand numbness   . Hypertension Dx 2009  . Vertigo    Past Surgical History:  Procedure Laterality Date  . MOUTH SURGERY      Objective:  BP 126/80   Pulse 78   Temp 98.6 F (37 C) (Oral)   Ht 5\' 9"  (1.753 m)   Wt (!) 315 lb (142.9 kg)   SpO2 97%   BMI 46.52 kg/m   Vitals and nursing note reviewed  General: NAD, pleasant, able to participate in exam Cardiac: RRR, normal heart sounds, no murmurs. 2+ radial and PT pulses bilaterally Respiratory: CTAB, normal effort, No wheezes, rales or rhonchi Abdomen: soft, nontender, nondistended, no hepatic or splenomegaly, +BS Extremities: right foot with  no swelling, no warmth or obvious signs of trauma. Fifth toes with range of motion, no pain elicited with manipulation, small hematoma noted on the tip of the top below the nail bed, no sign of drainage, of bleeding. Skin: warm and dry, no rashes noted Neuro: alert and oriented x4, no focal deficits Psych: Normal affect and mood   Assessment & Plan:   #Right foot pain, resolved  Patient does not have any more pain or concerns of fracture after trauma of right foot fifth toe. Exam is unremarkable. Will image if necessary, however do not anticipate need for additional evaluation  #STD testing  Main reason for today's visit, patient is concern about contracted an STD. Similar risky behavior in the past. We have discussed this issue once again today and patient has a good understanding of the consequences of such risky actions. --Will test for Syphilis, HIV, GC/CH --Will follow up with result.   Michael NeighboursAbdoulaye Regina Ganci, MD Family Medicine Resident PGY-1

## 2017-02-19 LAB — HIV ANTIBODY (ROUTINE TESTING W REFLEX): HIV Screen 4th Generation wRfx: NONREACTIVE

## 2017-02-19 LAB — RPR: RPR: NONREACTIVE

## 2017-02-19 LAB — URINE CYTOLOGY ANCILLARY ONLY
Chlamydia: NEGATIVE
Neisseria Gonorrhea: NEGATIVE
TRICH (WINDOWPATH): NEGATIVE

## 2017-02-23 ENCOUNTER — Encounter: Payer: Self-pay | Admitting: Family Medicine

## 2017-03-03 ENCOUNTER — Other Ambulatory Visit: Payer: Self-pay | Admitting: Family Medicine

## 2017-03-04 MED FILL — VICTOZA 18 MG/3 ML INJECT P: 18 | 30 days supply | Qty: 9 | Fill #0

## 2017-03-15 DIAGNOSIS — Z9989 Dependence on other enabling machines and devices: Secondary | ICD-10-CM | POA: Diagnosis not present

## 2017-03-15 DIAGNOSIS — G4733 Obstructive sleep apnea (adult) (pediatric): Secondary | ICD-10-CM | POA: Diagnosis not present

## 2017-03-19 ENCOUNTER — Emergency Department (HOSPITAL_BASED_OUTPATIENT_CLINIC_OR_DEPARTMENT_OTHER)
Admission: EM | Admit: 2017-03-19 | Discharge: 2017-03-19 | Disposition: A | Discharge status: Home / Self Care | Payer: 59 | Attending: Emergency Medicine | Admitting: Emergency Medicine

## 2017-03-19 ENCOUNTER — Emergency Department (HOSPITAL_BASED_OUTPATIENT_CLINIC_OR_DEPARTMENT_OTHER): Payer: 59

## 2017-03-19 ENCOUNTER — Encounter (HOSPITAL_BASED_OUTPATIENT_CLINIC_OR_DEPARTMENT_OTHER): Payer: Self-pay | Admitting: *Deleted

## 2017-03-19 DIAGNOSIS — Z79899 Other long term (current) drug therapy: Secondary | ICD-10-CM | POA: Diagnosis not present

## 2017-03-19 DIAGNOSIS — F17210 Nicotine dependence, cigarettes, uncomplicated: Secondary | ICD-10-CM | POA: Diagnosis not present

## 2017-03-19 DIAGNOSIS — Z8673 Personal history of transient ischemic attack (TIA), and cerebral infarction without residual deficits: Secondary | ICD-10-CM | POA: Insufficient documentation

## 2017-03-19 DIAGNOSIS — Y33XXXA Other specified events, undetermined intent, initial encounter: Secondary | ICD-10-CM | POA: Diagnosis not present

## 2017-03-19 DIAGNOSIS — S27818A Other injury of esophagus (thoracic part), initial encounter: Secondary | ICD-10-CM | POA: Insufficient documentation

## 2017-03-19 DIAGNOSIS — I10 Essential (primary) hypertension: Secondary | ICD-10-CM | POA: Diagnosis not present

## 2017-03-19 DIAGNOSIS — J0510 Acute epiglottitis without obstruction: Secondary | ICD-10-CM | POA: Diagnosis not present

## 2017-03-19 DIAGNOSIS — E119 Type 2 diabetes mellitus without complications: Secondary | ICD-10-CM | POA: Insufficient documentation

## 2017-03-19 DIAGNOSIS — R1310 Dysphagia, unspecified: Secondary | ICD-10-CM

## 2017-03-19 DIAGNOSIS — S1010XA Unspecified superficial injuries of throat, initial encounter: Secondary | ICD-10-CM | POA: Diagnosis present

## 2017-03-19 DIAGNOSIS — Z7984 Long term (current) use of oral hypoglycemic drugs: Secondary | ICD-10-CM | POA: Diagnosis not present

## 2017-03-19 DIAGNOSIS — J45909 Unspecified asthma, uncomplicated: Secondary | ICD-10-CM | POA: Insufficient documentation

## 2017-03-19 DIAGNOSIS — Y929 Unspecified place or not applicable: Secondary | ICD-10-CM | POA: Diagnosis not present

## 2017-03-19 DIAGNOSIS — Y9389 Activity, other specified: Secondary | ICD-10-CM | POA: Diagnosis not present

## 2017-03-19 DIAGNOSIS — F909 Attention-deficit hyperactivity disorder, unspecified type: Secondary | ICD-10-CM | POA: Insufficient documentation

## 2017-03-19 DIAGNOSIS — Y999 Unspecified external cause status: Secondary | ICD-10-CM | POA: Diagnosis not present

## 2017-03-19 NOTE — ED Notes (Signed)
ED Provider at bedside. 

## 2017-03-19 NOTE — ED Provider Notes (Signed)
[facility] Provider Note   CSN: [id number] Arrival date & time: 03/19/17  2124   By signing my name below, I, Michael Foster, attest that this documentation has been prepared under the direction and in the presence of Michael Porter, MD . Electronically Signed: Freida Foster, Scribe. 03/19/2017. 10:07 PM.  History   Chief Complaint Chief Complaint  Patient presents with  . Swallowed Foreign Body    The history is provided by the patient. No language interpreter was used.     HPI Comments:  Michael Foster is a 41 y.o. male with a history of DM,  who presents to the Emergency Department complaining of mild throat discomfort s/p  possibly swallowing a foreign body today ~1700. Pt states he was eating chicken from a wooden skewer and believes he may have swallowed a piece of it as he felt something sharp in his throat as he swallowed. Pt states he has tried to cough it up but has not seen anything come up. He denies hemoptysis. Pt has been tolerating PO since. No alleviating factors noted. Pt has no other acute complaints or associated symptoms at this time.    Past Medical History:  Diagnosis Date  . ADD (attention deficit disorder)   . Asthma    as a child  . Diabetes mellitus Dx 2009  . Gastroenteritis   . H/O TIA (transient ischemic attack) and stroke 01/04/2015   06/2014 with L hand numbness   . Hypertension Dx 2009  . Vertigo     Patient Active Problem List   Diagnosis Date Noted  . Chest pain 07/06/2015  . Dyspnea 07/06/2015  . Screening for STD (sexually transmitted disease) 06/09/2015  . H/O TIA (transient ischemic attack) and stroke 01/04/2015  . Diabetes (HCC) 06/10/2013  . Morbid obesity (HCC) 06/10/2013  . Asthma, chronic 06/10/2013  . Backache 03/22/2010  . HYPERLIPIDEMIA 01/17/2010  . Essential hypertension 01/17/2010  . OSA on CPAP 01/17/2010    Past Surgical History:  Procedure Laterality Date  . MOUTH SURGERY         Home Medications      Prior to Admission medications   Medication Sig Start Date End Date Taking? Authorizing Provider  albuterol (VENTOLIN HFA) 108 (90 Base) MCG/ACT inhaler Inhale 1-2 puffs into the lungs every 6 (six) hours as needed for wheezing or shortness of breath. 01/03/17   Oretha Milch, MD  Calcium Carbonate Antacid (ALKA-SELTZER ANTACID PO) Take 1 tablet by mouth daily as needed.    [provider]  cetirizine (ZYRTEC) 10 MG tablet Take 10 mg by mouth daily as needed for allergies.    [provider]  cyclobenzaprine (FLEXERIL) 10 MG tablet Take 1 tablet (10 mg total) by mouth 3 (three) times daily as needed for muscle spasms. 07/14/15   Funches, Gerilyn Nestle, MD  docusate sodium (COLACE) 100 MG capsule Take 1 capsule (100 mg total) by mouth every 12 (twelve) hours. 03/13/16   Zadie Rhine, MD  glucose blood (ACCU-CHEK GUIDE) test strip Use as instructed 12/10/16   Diallo, Lilia Argue, MD  Insulin Pen Needle (UNIFINE PENTIPS) 31G X 8 MM MISC See below 01/13/17   Diallo, Abdoulaye, MD  lisdexamfetamine (VYVANSE) 30 MG capsule Take 30 mg by mouth daily.    [provider]  lisinopril (PRINIVIL,ZESTRIL) 2.5 MG tablet Take 1 tablet (2.5 mg total) by mouth daily. 01/04/15   Funches, Gerilyn Nestle, MD  metFORMIN (GLUCOPHAGE) 500 MG tablet Take 2 tablets (1,000 mg total) by mouth 2 (two) times  daily with a meal. Patient taking differently: Take 1,000 mg by mouth daily.  03/09/15   Funches, Gerilyn Nestle, MD  metFORMIN (GLUCOPHAGE-XR) 500 MG 24 hr tablet Take 500 mg by mouth daily with breakfast.     [provider]  VICTOZA 18 MG/3ML SOPN INJECT 0.3 MLS (1.8 MG TOTAL) INTO THE SKIN DAILY. 03/04/17   Lovena Neighbours, MD    Family History Family History  Problem Relation Age of Onset  . COPD Father   . Hypertension Father   . Heart failure Father   . Diabetes Father   . Diabetes Mother   . Hyperlipidemia Mother   . Sleep apnea Brother   . Hypertension Brother     Social  History Social History  Substance Use Topics  . Smoking status: Former Smoker    Packs/day: 0.30    Years: 10.00    Types: Cigarettes    Quit date: 05/28/2013  . Smokeless tobacco: Never Used  . Alcohol use No     Allergies   Slo-bid gyrocaps [theophylline]   Review of Systems Review of Systems  Constitutional: Negative for appetite change, chills, diaphoresis, fatigue and fever.  HENT: Positive for sore throat. Negative for mouth sores.   Eyes: Negative for visual disturbance.  Respiratory: Negative for cough, chest tightness, shortness of breath and wheezing.   Cardiovascular: Negative for chest pain.  Gastrointestinal: Negative for abdominal distention, abdominal pain, diarrhea, nausea and vomiting.  Endocrine: Negative for polydipsia, polyphagia and polyuria.  Genitourinary: Negative for dysuria, frequency and hematuria.  Musculoskeletal: Negative for gait problem.  Skin: Negative for color change, pallor and rash.  Neurological: Negative for dizziness, syncope, light-headedness and headaches.  Hematological: Does not bruise/bleed easily.  Psychiatric/Behavioral: Negative for behavioral problems and confusion.     Physical Exam Updated Vital Signs BP 131/83   Pulse 80   Temp 98.5 F (36.9 C)   Resp 16   Ht 5\' 9"  (1.753 m)   Wt (!) 142 kg (313 lb)   SpO2 98%   BMI 46.22 kg/m   Physical Exam  Constitutional: He is oriented to person, place, and time. He appears well-developed and well-nourished. No distress.  HENT:  Head: Normocephalic.  Mouth/Throat: Tonsils are 3+ on the right. Tonsils are 3+ on the left.  Eyes: Conjunctivae are normal. Pupils are equal, round, and reactive to light. No scleral icterus.  Neck: Normal range of motion. Neck supple. No thyromegaly present.  Cardiovascular: Normal rate and regular rhythm.  Exam reveals no gallop and no friction rub.   No murmur heard. Pulmonary/Chest: Effort normal and breath sounds normal. No respiratory  distress. He has no wheezes. He has no rales.  Abdominal: Soft. Bowel sounds are normal. He exhibits no distension. There is no tenderness. There is no rebound.  Musculoskeletal: Normal range of motion.  Neurological: He is alert and oriented to person, place, and time.  Skin: Skin is warm and dry. No rash noted.  Psychiatric: He has a normal mood and affect. His behavior is normal.  Nursing note and vitals reviewed.    ED Treatments / Results  DIAGNOSTIC STUDIES:  Oxygen Saturation is 98% on RA, normal by my interpretation.    COORDINATION OF CARE:  9:57 PM Discussed treatment plan with pt at bedside and pt agreed to plan.  Labs (all labs ordered are listed, but only abnormal results are displayed) Labs Reviewed - No data to display  EKG  EKG Interpretation None       Radiology Dg Neck  Soft Tissue  Result Date: 03/19/2017 CLINICAL DATA:  Foreign body sensation in throat following possible swallowing piece of wood. EXAM: NECK SOFT TISSUES - 1+ VIEW COMPARISON:  None. FINDINGS: There is no evidence of retropharyngeal soft tissue swelling. The epiglottis is upper limits of normal in size. The cervical airway is unremarkable and no radio-opaque foreign body identified. IMPRESSION: Upper limits of normal sized epiglottis without other significant abnormality. No radiopaque foreign body identified. Electronically Signed   By: Harmon PierJeffrey  Hu M.D.   On: 03/19/2017 22:24    Procedures Procedures (including critical care time)  Medications Ordered in ED Medications - No data to display   Initial Impression / Assessment and Plan / ED Course  I have reviewed the triage vital signs and the nursing notes.  Pertinent labs & imaging results that were available during my care of the patient were reviewed by me and considered in my medical decision making (see chart for details).    Plain film x-rays normal. I discussed with Mr. Cleon Gustinshby likely this is an esophageal abrasion. Only time will  tell. No indication for additional imaging or endoscopy at this point he is swallowing without difficulty or symptoms now. I discharged him to do a soft mechanical diet tonight and tomorrow. If his pain persisted today tomorrow vascular call the GI number given for referral. Return to ER with worsening symptoms   Final Clinical Impressions(s) / ED Diagnoses   Final diagnoses:  Odynophagia  Abrasion of esophagus, initial encounter    New Prescriptions New Prescriptions   No medications on file   I personally performed the services described in this documentation, which was scribed in my presence. The recorded information has been reviewed and is accurate.     Michael PorterJames, Ari Engelbrecht, MD 03/19/17 2237

## 2017-03-19 NOTE — ED Notes (Signed)
Pt reports "making himself vomit several times to get the splinter out with no relief"

## 2017-03-19 NOTE — ED Triage Notes (Signed)
Pt states he ? Swallowed a wooden splinter of skewer x 1 hr ago

## 2017-03-19 NOTE — Discharge Instructions (Signed)
Your throat pain is likely from an esophageal abrasion.  Soft mechanical diet as discussed. If your pain persists linger than 24 hours, call Gastroenterologist above for appointment. Return to ER with worsening pain, or other changes.

## 2017-03-19 NOTE — ED Notes (Signed)
Pt verbalizes understanding of d/c instructions and denies any further needs at this time. 

## 2017-03-31 ENCOUNTER — Other Ambulatory Visit (INDEPENDENT_AMBULATORY_CARE_PROVIDER_SITE_OTHER): Payer: 59

## 2017-03-31 ENCOUNTER — Ambulatory Visit (INDEPENDENT_AMBULATORY_CARE_PROVIDER_SITE_OTHER): Payer: 59 | Admitting: Family Medicine

## 2017-03-31 DIAGNOSIS — E1165 Type 2 diabetes mellitus with hyperglycemia: Secondary | ICD-10-CM

## 2017-03-31 LAB — POCT GLYCOSYLATED HEMOGLOBIN (HGB A1C): Hemoglobin A1C: 8

## 2017-04-01 ENCOUNTER — Other Ambulatory Visit: Payer: 59

## 2017-04-04 ENCOUNTER — Encounter: Payer: Self-pay | Admitting: Pulmonary Disease

## 2017-04-04 ENCOUNTER — Telehealth: Payer: Self-pay

## 2017-04-04 ENCOUNTER — Ambulatory Visit (INDEPENDENT_AMBULATORY_CARE_PROVIDER_SITE_OTHER): Payer: 59 | Admitting: Pulmonary Disease

## 2017-04-04 VITALS — BP 122/78 | HR 78 | Ht 69.0 in | Wt 322.0 lb

## 2017-04-04 DIAGNOSIS — G4733 Obstructive sleep apnea (adult) (pediatric): Secondary | ICD-10-CM | POA: Diagnosis not present

## 2017-04-04 DIAGNOSIS — J4520 Mild intermittent asthma, uncomplicated: Secondary | ICD-10-CM | POA: Diagnosis not present

## 2017-04-04 DIAGNOSIS — Z9989 Dependence on other enabling machines and devices: Secondary | ICD-10-CM | POA: Diagnosis not present

## 2017-04-04 MED ORDER — ALBUTEROL SULFATE HFA 108 (90 BASE) MCG/ACT IN AERS: 1.0000 | INHALATION_SPRAY | Freq: Four times a day (QID) | RESPIRATORY_TRACT | 3 refills | Status: DC | PRN

## 2017-04-04 MED FILL — VENTOLIN HFA 90 MCG INHALER: 108 (90 BAS | 25 days supply | Qty: 18 | Fill #0

## 2017-04-04 NOTE — Assessment & Plan Note (Signed)
Refills on albuterol

## 2017-04-04 NOTE — Progress Notes (Signed)
   Subjective:    Patient ID: Randel Piggichard Biss, male    DOB: 04/10/1976, 41 y.o.   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  He reports asthma since childhood, never hospitalised, worse during spring, uses albuterol MDI more frequently during the spring. Is currently out of her prescription and needs that.    He likes his new CPAP machine, download shows good compliance and mild leak on occasion. He uses a full face mask denies dryness or nasal congestion There is a positive before the pressure is increased and he does not like He reports good improvement in his daytime somnolence and fatigue  His asthma has been well controlled. He always takes albuterol at bedtime and denies nocturnal wheezing. His HbA1c has improved to 8  Significant tests/ events  06/2003 Baseline PSG at [facility]  , wt 352, BMI 53 showed RDI 30/h, corrected by CPAP 9 cm  02/2010 CPAP titration >> 15-19 cm  Spirometry 02/2010  FEv1 50%, pos BD response Spirometry 12/2016  no airway obstruction with FEV1 of 77%  Review of Systems Patient denies significant dyspnea,cough, hemoptysis,  chest pain, palpitations, pedal edema, orthopnea, paroxysmal nocturnal dyspnea, lightheadedness, nausea, vomiting, abdominal or  leg pains      Objective:   Physical Exam  Gen. Pleasant, obese, in no distress ENT - no lesions, no post nasal drip Neck: No JVD, no thyromegaly, no carotid bruits Lungs: no use of accessory muscles, no dullness to percussion, decreased without rales or rhonchi  Cardiovascular: Rhythm regular, heart sounds  normal, no murmurs or gallops, no peripheral edema Musculoskeletal: No deformities, no cyanosis or clubbing , no tremors       Assessment & Plan:

## 2017-04-04 NOTE — Patient Instructions (Signed)
We will make adjustments to your CPAP machine Prescription for albuterol MDI to be sent

## 2017-04-04 NOTE — Assessment & Plan Note (Signed)
We'll increase auto CPAP settings to 15-20 cm Discussed measures to fix leak including Beard   Weight loss encouraged, compliance with goal of at least 4-6 hrs every night is the expectation. Advised against medications with sedative side effects Cautioned against driving when sleepy - understanding that sleepiness will vary on a day to day basis

## 2017-04-04 NOTE — Addendum Note (Signed)
Addended by: Maxwell MarionBLANKENSHIP, Gabriela Giannelli A on: 04/04/2017 01:38 PM   Modules accepted: Orders

## 2017-04-04 NOTE — Telephone Encounter (Signed)
Per RA verbally-place order for pressure change auto 15-20cm Order has been placed. Nothing further needed.

## 2017-04-07 ENCOUNTER — Telehealth: Payer: Self-pay | Admitting: Pulmonary Disease

## 2017-04-07 DIAGNOSIS — G4733 Obstructive sleep apnea (adult) (pediatric): Secondary | ICD-10-CM

## 2017-04-07 MED FILL — VICTOZA 18 MG/3 ML INJECT P: 18 | 30 days supply | Qty: 9 | Fill #1

## 2017-04-07 NOTE — Telephone Encounter (Signed)
Spoke with Michael Foster and made him aware that [facility], as [facility] should have [facility] to [facility]. Michael Foster states he will cancel order on their end. Nothing further needed.

## 2017-04-14 DIAGNOSIS — Z9989 Dependence on other enabling machines and devices: Secondary | ICD-10-CM | POA: Diagnosis not present

## 2017-04-14 DIAGNOSIS — G4733 Obstructive sleep apnea (adult) (pediatric): Secondary | ICD-10-CM | POA: Diagnosis not present

## 2017-05-28 ENCOUNTER — Other Ambulatory Visit: Payer: Self-pay | Admitting: Family Medicine

## 2017-05-28 MED FILL — VICTOZA 18 MG/3 ML INJECT P: 18 | 30 days supply | Qty: 9 | Fill #0

## 2017-06-05 MED FILL — VENTOLIN HFA 90 MCG INHALER: 108 (90 BAS | 50 days supply | Qty: 36 | Fill #1

## 2017-06-10 ENCOUNTER — Other Ambulatory Visit: Payer: Self-pay | Admitting: *Deleted

## 2017-06-10 DIAGNOSIS — I10 Essential (primary) hypertension: Secondary | ICD-10-CM

## 2017-06-10 MED ORDER — LISINOPRIL 2.5 MG PO TABS: 2.5000 mg | ORAL_TABLET | Freq: Every day | ORAL | 11 refills | Status: DC

## 2017-06-10 MED FILL — LISINOPRIL 2.5 MG TABLET: 2.5 | 90 days supply | Qty: 90 | Fill #0

## 2017-06-18 ENCOUNTER — Other Ambulatory Visit: Payer: Self-pay | Admitting: Family Medicine

## 2017-06-18 MED FILL — UNIFINE PENTIPS 8MM 31G: 31G X 8 MM | 30 days supply | Qty: 100 | Fill #0

## 2017-07-15 DIAGNOSIS — G4733 Obstructive sleep apnea (adult) (pediatric): Secondary | ICD-10-CM | POA: Diagnosis not present

## 2017-07-28 ENCOUNTER — Other Ambulatory Visit: Payer: Self-pay

## 2017-07-28 NOTE — Patient Outreach (Signed)
[facility] Care Management  07/28/2017  Michael PiggRichard Foster 10/31/1975 [id number]   Notified by e-mail by member that [place] and he no longer has the [employer] insurance.  Terminated from the [facility] program. Case closed as member has dis-enrolled from coverage. Dudley MajorMelissa Shakaya Bhullar RN, [facility],CCM Care Management Coordinator-Link to [facility] [phone]

## 2017-10-07 ENCOUNTER — Telehealth: Payer: Self-pay | Admitting: Family Medicine

## 2017-10-07 ENCOUNTER — Other Ambulatory Visit: Payer: Self-pay | Admitting: Family Medicine

## 2017-10-07 NOTE — Telephone Encounter (Signed)
Pt called and would like a refill on his inhaler. He has an appointment to be seen next week but will not enough medication to last until then. He is also using [facility] [address] now .

## 2017-10-08 MED ORDER — ALBUTEROL SULFATE HFA 108 (90 BASE) MCG/ACT IN AERS: 1.0000 | INHALATION_SPRAY | Freq: Four times a day (QID) | RESPIRATORY_TRACT | 3 refills | Status: DC | PRN

## 2017-10-14 ENCOUNTER — Other Ambulatory Visit: Payer: Self-pay

## 2017-10-14 ENCOUNTER — Encounter (HOSPITAL_BASED_OUTPATIENT_CLINIC_OR_DEPARTMENT_OTHER): Payer: Self-pay

## 2017-10-14 ENCOUNTER — Emergency Department (HOSPITAL_BASED_OUTPATIENT_CLINIC_OR_DEPARTMENT_OTHER)
Admission: EM | Admit: 2017-10-14 | Discharge: 2017-10-14 | Disposition: A | Discharge status: Home / Self Care | Payer: Self-pay | Attending: Emergency Medicine | Admitting: Emergency Medicine

## 2017-10-14 DIAGNOSIS — Z8673 Personal history of transient ischemic attack (TIA), and cerebral infarction without residual deficits: Secondary | ICD-10-CM | POA: Insufficient documentation

## 2017-10-14 DIAGNOSIS — J45909 Unspecified asthma, uncomplicated: Secondary | ICD-10-CM | POA: Insufficient documentation

## 2017-10-14 DIAGNOSIS — Z7984 Long term (current) use of oral hypoglycemic drugs: Secondary | ICD-10-CM | POA: Insufficient documentation

## 2017-10-14 DIAGNOSIS — Z87891 Personal history of nicotine dependence: Secondary | ICD-10-CM | POA: Insufficient documentation

## 2017-10-14 DIAGNOSIS — I10 Essential (primary) hypertension: Secondary | ICD-10-CM | POA: Insufficient documentation

## 2017-10-14 DIAGNOSIS — E119 Type 2 diabetes mellitus without complications: Secondary | ICD-10-CM | POA: Insufficient documentation

## 2017-10-14 DIAGNOSIS — J069 Acute upper respiratory infection, unspecified: Secondary | ICD-10-CM | POA: Insufficient documentation

## 2017-10-14 DIAGNOSIS — R05 Cough: Secondary | ICD-10-CM

## 2017-10-14 DIAGNOSIS — R059 Cough, unspecified: Secondary | ICD-10-CM

## 2017-10-14 MED ORDER — PREDNISONE 20 MG PO TABS: ORAL_TABLET | ORAL | 0 refills | Status: DC

## 2017-10-14 NOTE — ED Triage Notes (Signed)
C/o nonprod cough, wheezing x 2 days-last used albuterol inhaler 20 min PTA-NAD-steady gait

## 2017-10-14 NOTE — ED Provider Notes (Signed)
[facility] EMERGENCY DEPARTMENT Provider Note   CSN: [id number] Arrival date & time: 10/14/17  1721     History   Chief Complaint Chief Complaint  Patient presents with  . Cough    HPI Michael Foster is a 42 y.o. male.  Patient with URI symptoms x 2 days. Has taken OTC meds with improvement in symptoms, however cough persists. Has been using albuterol more frequently than normal for symptom control.    Cough  This is a new problem. The current episode started 2 days ago. The problem has not changed since onset.The cough is non-productive. There has been no fever. Associated symptoms include rhinorrhea. Treatments tried: albuterol. The treatment provided mild relief. His past medical history is significant for asthma.    Past Medical History:  Diagnosis Date  . ADD (attention deficit disorder)   . Asthma    as a child  . Diabetes mellitus Dx 2009  . Gastroenteritis   . H/O TIA (transient ischemic attack) and stroke 01/04/2015   06/2014 with L hand numbness   . Hypertension Dx 2009  . Vertigo     Patient Active Problem List   Diagnosis Date Noted  . Chest pain 07/06/2015  . Dyspnea 07/06/2015  . Screening for STD (sexually transmitted disease) 06/09/2015  . H/O TIA (transient ischemic attack) and stroke 01/04/2015  . Diabetes (HCC) 06/10/2013  . Morbid obesity (HCC) 06/10/2013  . Asthma, chronic 06/10/2013  . Backache 03/22/2010  . HYPERLIPIDEMIA 01/17/2010  . Essential hypertension 01/17/2010  . OSA on CPAP 01/17/2010    Past Surgical History:  Procedure Laterality Date  . MOUTH SURGERY         Home Medications    Prior to Admission medications   Medication Sig Start Date End Date Taking? Authorizing Provider  albuterol (VENTOLIN HFA) 108 (90 Base) MCG/ACT inhaler Inhale 1-2 puffs into the lungs every 6 (six) hours as needed for wheezing or shortness of breath. 10/08/17   Diallo, Lilia Argue, MD  Calcium Carbonate Antacid (ALKA-SELTZER ANTACID  PO) Take 1 tablet by mouth daily as needed.    [provider]  cetirizine (ZYRTEC) 10 MG tablet Take 10 mg by mouth daily as needed for allergies.    [provider]  cyclobenzaprine (FLEXERIL) 10 MG tablet Take 1 tablet (10 mg total) by mouth 3 (three) times daily as needed for muscle spasms. 07/14/15   Funches, Gerilyn Nestle, MD  docusate sodium (COLACE) 100 MG capsule Take 1 capsule (100 mg total) by mouth every 12 (twelve) hours. 03/13/16   Zadie Rhine, MD  glucose blood (ACCU-CHEK GUIDE) test strip Use as instructed 12/10/16   Diallo, Abdoulaye, MD  lisdexamfetamine (VYVANSE) 30 MG capsule Take 30 mg by mouth daily.    [provider]  lisinopril (PRINIVIL,ZESTRIL) 2.5 MG tablet Take 1 tablet (2.5 mg total) by mouth daily. 06/10/17   Diallo, Lilia Argue, MD  metFORMIN (GLUCOPHAGE) 500 MG tablet Take 2 tablets (1,000 mg total) by mouth 2 (two) times daily with a meal. Patient taking differently: Take 1,000 mg by mouth daily.  03/09/15   Funches, Gerilyn Nestle, MD  metFORMIN (GLUCOPHAGE-XR) 500 MG 24 hr tablet Take 500 mg by mouth daily with breakfast.     [provider]  UNIFINE PENTIPS 31G X 8 MM MISC USE AS DIRECTED WITH VICTOZA 06/18/17   Diallo, Abdoulaye, MD  VICTOZA 18 MG/3ML SOPN INJECT 0.3 MLS (1.8 MG TOTAL) INTO THE SKIN DAILY. 05/28/17   Lovena Neighbours, MD    Family History Family  History  Problem Relation Age of Onset  . COPD Father   . Hypertension Father   . Heart failure Father   . Diabetes Father   . Diabetes Mother   . Hyperlipidemia Mother   . Sleep apnea Brother   . Hypertension Brother     Social History Social History   Tobacco Use  . Smoking status: Former Smoker    Packs/day: 0.30    Years: 10.00    Pack years: 3.00    Types: Cigarettes    Last attempt to quit: 05/28/2013    Years since quitting: 4.3  . Smokeless tobacco: Never Used  Substance Use Topics  . Alcohol use: No  . Drug use: No     Allergies   Slo-bid gyrocaps  [theophylline]   Review of Systems Review of Systems  HENT: Positive for congestion and rhinorrhea.   Respiratory: Positive for cough.   Gastrointestinal: Negative for nausea and vomiting.  All other systems reviewed and are negative.    Physical Exam Updated Vital Signs BP (!) 159/84 (BP Location: Left Arm)   Pulse 74   Temp 98.5 F (36.9 C) (Oral)   Resp 20   Ht 5\' 9"  (1.753 m)   Wt 134.3 kg (296 lb)   SpO2 98%   BMI 43.71 kg/m   Physical Exam  Constitutional: He is oriented to person, place, and time. He appears well-developed and well-nourished.  HENT:  Head: Normocephalic.  Eyes: Conjunctivae are normal.  Neck: Neck supple.  Cardiovascular: Normal rate and regular rhythm.  Pulmonary/Chest: Effort normal and breath sounds normal. No respiratory distress. He has no wheezes.  Abdominal: Soft. Bowel sounds are normal.  Musculoskeletal: Normal range of motion. He exhibits no edema.  Lymphadenopathy:    He has no cervical adenopathy.  Neurological: He is alert and oriented to person, place, and time.  Skin: Skin is warm and dry.  Psychiatric: He has a normal mood and affect.  Nursing note and vitals reviewed.    ED Treatments / Results  Labs (all labs ordered are listed, but only abnormal results are displayed) Labs Reviewed - No data to display  EKG  EKG Interpretation None       Radiology No results found.  Procedures Procedures (including critical care time)  Medications Ordered in ED Medications - No data to display   Initial Impression / Assessment and Plan / ED Course  I have reviewed the triage vital signs and the nursing notes.  Pertinent labs & imaging results that were available during my care of the patient were reviewed by me and considered in my medical decision making (see chart for details).     Pt symptoms consistent with URI. History of asthma. No current wheezing or dyspnea.  Pt will be discharged with symptomatic treatment,  along with a short course of prednisone.  Discussed return precautions.  Pt is hemodynamically stable & in NAD prior to discharge.  Final Clinical Impressions(s) / ED Diagnoses   Final diagnoses:  Upper respiratory tract infection, unspecified type  Cough    ED Discharge Orders        Ordered    predniSONE (DELTASONE) 20 MG tablet     10/14/17 1937       Felicie MornSmith, Nikka Hakimian, NP 10/14/17 1940    Rolland PorterJames, Mark, MD 10/14/17 [phone]

## 2017-10-16 ENCOUNTER — Encounter: Payer: Self-pay | Admitting: Family Medicine

## 2017-10-16 ENCOUNTER — Ambulatory Visit (INDEPENDENT_AMBULATORY_CARE_PROVIDER_SITE_OTHER): Payer: Self-pay | Admitting: Family Medicine

## 2017-10-16 VITALS — BP 160/82 | HR 83 | Temp 98.3°F | Ht 69.0 in | Wt 295.0 lb

## 2017-10-16 DIAGNOSIS — F329 Major depressive disorder, single episode, unspecified: Secondary | ICD-10-CM

## 2017-10-16 DIAGNOSIS — E1165 Type 2 diabetes mellitus with hyperglycemia: Secondary | ICD-10-CM

## 2017-10-16 LAB — POCT GLYCOSYLATED HEMOGLOBIN (HGB A1C): Hemoglobin A1C: 11.9

## 2017-10-16 MED ORDER — INSULIN GLARGINE 100 UNIT/ML SOLOSTAR PEN
20.0000 [IU] | PEN_INJECTOR | Freq: Every day | SUBCUTANEOUS | 0 refills | Status: DC
Start: 2017-10-16 — End: 2018-05-08

## 2017-10-16 MED ORDER — INSULIN PEN NEEDLE 32G X 6 MM MISC: 20.0000 [IU] | Freq: Every day | 0 refills | Status: DC

## 2017-10-16 NOTE — Progress Notes (Signed)
   Subjective:    Patient ID: Randel Piggichard Plath, male    DOB: 08/06/1976, 42 y.o.   MRN: [id number]   CC: Follow-up for type 2 diabetes  HPI: Patient is a 42 year old male who presents today to follow-up on type 2 diabetes management.  Patient reports that for the past 3 months he has been unable to take his medication because he did not have insurance.  Patient just recently switch jobs and is currently under contract with no insurance coverage and has been unable to afford medication.  Patient also reports dietary indiscretions and lack of exercise as previously discussed at prior office visit.  Patient expects A1c to be higher than previous one which was 8.0.  Patient denies any polyuria and polydipsia.  Smoking status reviewed   ROS: all other systems were reviewed and are negative other than in the HPI   Past Medical History:  Diagnosis Date  . ADD (attention deficit disorder)   . Asthma    as a child  . Diabetes mellitus Dx 2009  . Gastroenteritis   . H/O TIA (transient ischemic attack) and stroke 01/04/2015   06/2014 with L hand numbness   . Hypertension Dx 2009  . Vertigo     Past Surgical History:  Procedure Laterality Date  . MOUTH SURGERY      Past medical history, surgical, family, and social history reviewed and updated in the EMR as appropriate.  Objective:  BP (!) 160/82   Pulse 83   Temp 98.3 F (36.8 C) (Oral)   Ht 5\' 9"  (1.753 m)   Wt 295 lb (133.8 kg)   SpO2 95%   BMI 43.56 kg/m   Vitals and nursing note reviewed  General: NAD, pleasant, able to participate in exam Cardiac: RRR, normal heart sounds, no murmurs. 2+ radial and PT pulses bilaterally Respiratory: CTAB, normal effort, No wheezes, rales or rhonchi Abdomen: soft, nontender, nondistended, no hepatic or splenomegaly, +BS Extremities: no edema or cyanosis. WWP. Skin: warm and dry, no rashes noted Neuro: alert and oriented x4, no focal deficits Psych: Normal affect and mood   Assessment &  Plan:    #Type 2 diabetes, uncontrolled Today A1c is 11.9, much higher than last A1c in July which was 8.0.  Results on the surprising given the lack of medication adherence for the past 4 months as well as uncontrolled diet.  Patient is negative be able to get insurance until end of February, however given elevated A1c and likely blood glucose will start patient on Lantus provided by our clinic in the meantime.  Patient will make appropriate dietary changes as well as resume physical activity needed for better control of type 2 diabetes. --Patient provided with 2 Lantus Solostar pen as she last 30 days.  Patient will start on 20 units of Lantus daily --Patient will titrate metformin which she has home to a max dose of 2000 mg twice daily. --Discussed with patient signs of hypoglycemia and measures that need to be taken.    Lovena NeighboursAbdoulaye Shantice Menger, MD [facility] PGY-2

## 2017-10-16 NOTE — Patient Instructions (Addendum)
It was great seeing you today! We have addressed the following issues today  1. I am starting you on lantus 20 units day take it in the morning 2. I want you to resume your metformin. You can start with 500 mg a day the rest of this week. Increase to 500 mg two times a day next week. The week after next 2000 mg a day (2 pills in the morinng and 2 pills in the evenings). 3. Make an appointment to see me in about 3-4 weeks for follow up   If we did any lab work today, and the results require attention, either me or my nurse will get in touch with you. If everything is normal, you will get a letter in mail and a message via . If you don't hear from us in two weeks, please give us a call. Otherwise, we look forward to seeing you again at your next visit. If you have any questions or concerns before then, please call the clinic at [phone].  Please bring all your medications to every doctors visit  Sign up for My Chart to have easy access to your labs results, and communication with your Primary care physician. Please ask Front Desk for some assistance.   Please check-out at the front desk before leaving the clinic.    Take Care,   Dr. Sydnee Cabaliallo

## 2017-10-17 NOTE — Addendum Note (Signed)
Addended by: Lovena NeighboursIALLO, Colisha Redler F on: 10/17/2017 06:18 PM   Modules accepted: Orders

## 2017-11-13 ENCOUNTER — Ambulatory Visit (INDEPENDENT_AMBULATORY_CARE_PROVIDER_SITE_OTHER): Payer: Self-pay | Admitting: Family Medicine

## 2017-11-13 ENCOUNTER — Telehealth: Payer: Self-pay

## 2017-11-13 ENCOUNTER — Encounter: Payer: Self-pay | Admitting: Family Medicine

## 2017-11-13 ENCOUNTER — Other Ambulatory Visit: Payer: Self-pay

## 2017-11-13 VITALS — BP 120/76 | HR 77 | Temp 98.2°F | Wt 294.0 lb

## 2017-11-13 DIAGNOSIS — E1165 Type 2 diabetes mellitus with hyperglycemia: Secondary | ICD-10-CM

## 2017-11-13 MED ORDER — INSULIN GLARGINE 100 UNIT/ML SOLOSTAR PEN: 24.0000 [IU] | PEN_INJECTOR | Freq: Every day | SUBCUTANEOUS | 99 refills | Status: DC

## 2017-11-13 NOTE — Assessment & Plan Note (Deleted)
Patient presents for T2DM management. BG still elevated despite max dose of metformin and Lantus 20u daily. Patient has made appropriate lifestyle changes but still need titration of his regimen. Will increase his lantus to 24u and patient will call next week to report on fasting BG that will aidin further adjusting regimen. He will follow up in a month for A1c check. Patient currently does not have insurance and has been getting his Lantus from this clinic. Insurance plan is supposed to start in April. Encourage patient to continue with TLC.  

## 2017-11-13 NOTE — Progress Notes (Signed)
   Subjective:    Patient ID: Michael Foster, male    DOB: 07/22/1976, 42 y.o.   MRN: [id number]   CC: Follow up for diabetes   HPI: Patient is a 42 yo male who presents today to follow up on diabetes management. Patient was started on insulin at last office visit. Patient reports he has been taking his Lantus 20u daily in addition to metformin 2000 mg. Patient reports she had to stop taking his medications for a few days due to diarrhea but has since resume with better tolerance. Patient's fasting blood sugar are still ranging between 250-300 consistent with A1c of 11.9. Patient has been making some dietary changes and since last office visit has started to work out on a daily basis. Patient reports intermittent polyuria and polydipsia but endorses that symptoms are much improved. Patient denies any chest pain, shortness of breath, abdominal pain, nausea, vomiting.   Smoking status reviewed   ROS: all other systems were reviewed and are negative other than in the HPI   Past Medical History:  Diagnosis Date  . ADD (attention deficit disorder)   . Asthma    as a child  . Diabetes mellitus Dx 2009  . Gastroenteritis   . H/O TIA (transient ischemic attack) and stroke 01/04/2015   06/2014 with L hand numbness   . Hypertension Dx 2009  . Vertigo     Past Surgical History:  Procedure Laterality Date  . MOUTH SURGERY      Past medical history, surgical, family, and social history reviewed and updated in the EMR as appropriate.  Objective:  BP 120/76   Pulse 77   Temp 98.2 F (36.8 C) (Oral)   Wt 294 lb (133.4 kg)   SpO2 99%   BMI 43.42 kg/m   Vitals and nursing note reviewed  General: NAD, pleasant, able to participate in exam Cardiac: RRR, normal heart sounds, no murmurs. 2+ radial and PT pulses bilaterally Respiratory: CTAB, normal effort, No wheezes, rales or rhonchi Abdomen: soft, nontender, nondistended, no hepatic or splenomegaly, +BS Extremities: no edema or cyanosis.  WWP. Skin: warm and dry, no rashes noted Neuro: alert and oriented x4, no focal deficits Psych: Normal affect and mood   Assessment & Plan:   #T2DM follow up Patient presents for T2DM management. BG still elevated despite max dose of metformin and Lantus 20u daily. Patient has made appropriate lifestyle changes but still need titration of his regimen. Will increase his lantus to 24u and patient will call next week to report on fasting BG that will aidin further adjusting regimen. He will follow up in a month for A1c check. Patient currently does not have insurance and has been getting his Lantus from this clinic. Insurance plan is supposed to start in April. Encourage patient to continue with TLC.    Lovena NeighboursAbdoulaye Annissa Andreoni, MD [facility] PGY-2

## 2017-11-13 NOTE — Assessment & Plan Note (Deleted)
Patient presents for T2DM management. BG still elevated despite max dose of metformin and Lantus 20u daily. Patient has made appropriate lifestyle changes but still need titration of his regimen. Will increase his lantus to 24u and patient will call next week to report on fasting BG that will aidin further adjusting regimen. He will follow up in a month for A1c check. Patient currently does not have insurance and has been getting his Lantus from this clinic. Insurance plan is supposed to start in April. Encourage patient to continue with TLC.

## 2017-11-13 NOTE — Telephone Encounter (Signed)
Pt here for routine fu and asked about his pys referral. Referral was placed 10/17/2017 by pcp. I spoke with RC and she stated she would look into it. Ok to leave VM with referral info on patients phone.

## 2017-11-13 NOTE — Patient Instructions (Addendum)
It was great seeing you today! We have addressed the following issues today  1. I want you to increase your Lantus to 24 units. Please let me know about your morning readings next week.  2. Continue your exercise and diet plan.  If we did any lab work today, and the results require attention, either me or my nurse will get in touch with you. If everything is normal, you will get a letter in mail and a message via . If you don't hear from us in two weeks, please give us a call. Otherwise, we look forward to seeing you again at your next visit. If you have any questions or concerns before then, please call the clinic at [phone].  Please bring all your medications to every doctors visit  Sign up for My Chart to have easy access to your labs results, and communication with your Primary care physician. Please ask Front Desk for some assistance.   Please check-out at the front desk before leaving the clinic.    Take Care,   Dr. Sydnee Cabaliallo

## 2017-11-13 NOTE — Progress Notes (Signed)
Error. Patient was only scheduled for a lab visit.

## 2017-12-15 ENCOUNTER — Ambulatory Visit: Payer: Self-pay | Admitting: Family Medicine

## 2018-01-05 ENCOUNTER — Emergency Department (HOSPITAL_BASED_OUTPATIENT_CLINIC_OR_DEPARTMENT_OTHER)
Admission: EM | Admit: 2018-01-05 | Discharge: 2018-01-05 | Disposition: A | Discharge status: Home / Self Care | Payer: Self-pay | Attending: Emergency Medicine | Admitting: Emergency Medicine

## 2018-01-05 ENCOUNTER — Other Ambulatory Visit: Payer: Self-pay

## 2018-01-05 ENCOUNTER — Encounter (HOSPITAL_BASED_OUTPATIENT_CLINIC_OR_DEPARTMENT_OTHER): Payer: Self-pay | Admitting: *Deleted

## 2018-01-05 DIAGNOSIS — Z8673 Personal history of transient ischemic attack (TIA), and cerebral infarction without residual deficits: Secondary | ICD-10-CM | POA: Insufficient documentation

## 2018-01-05 DIAGNOSIS — Z79899 Other long term (current) drug therapy: Secondary | ICD-10-CM | POA: Insufficient documentation

## 2018-01-05 DIAGNOSIS — Z760 Encounter for issue of repeat prescription: Secondary | ICD-10-CM | POA: Insufficient documentation

## 2018-01-05 DIAGNOSIS — E119 Type 2 diabetes mellitus without complications: Secondary | ICD-10-CM | POA: Insufficient documentation

## 2018-01-05 DIAGNOSIS — I10 Essential (primary) hypertension: Secondary | ICD-10-CM | POA: Insufficient documentation

## 2018-01-05 DIAGNOSIS — J45901 Unspecified asthma with (acute) exacerbation: Secondary | ICD-10-CM | POA: Insufficient documentation

## 2018-01-05 DIAGNOSIS — Z87891 Personal history of nicotine dependence: Secondary | ICD-10-CM | POA: Insufficient documentation

## 2018-01-05 DIAGNOSIS — Z794 Long term (current) use of insulin: Secondary | ICD-10-CM | POA: Insufficient documentation

## 2018-01-05 DIAGNOSIS — B372 Candidiasis of skin and nail: Secondary | ICD-10-CM | POA: Insufficient documentation

## 2018-01-05 MED ORDER — FLUCONAZOLE 100 MG PO TABS
200.0000 mg | ORAL_TABLET | Freq: Once | ORAL | Status: AC
  Administered 2018-01-05: 200 mg via ORAL
  Filled 2018-01-05: qty 2

## 2018-01-05 MED ORDER — ALBUTEROL SULFATE HFA 108 (90 BASE) MCG/ACT IN AERS
2.0000 | INHALATION_SPRAY | Freq: Once | RESPIRATORY_TRACT | Status: AC
  Administered 2018-01-05: 2 via RESPIRATORY_TRACT
  Filled 2018-01-05: qty 6.7

## 2018-01-05 MED ORDER — ALBUTEROL SULFATE HFA 108 (90 BASE) MCG/ACT IN AERS: 2.0000 | INHALATION_SPRAY | RESPIRATORY_TRACT | 4 refills | Status: DC | PRN

## 2018-01-05 MED ORDER — METFORMIN HCL 500 MG PO TABS: 500.0000 mg | ORAL_TABLET | Freq: Two times a day (BID) | ORAL | 0 refills | Status: DC

## 2018-01-05 MED FILL — metFORMIN HCL 500 MG TABS: 500 | 30 days supply | Qty: 60 | Fill #0

## 2018-01-05 MED FILL — PROVENTIL HFA 108 (90 BASE): 108 (90 BAS | 18 days supply | Qty: 7 | Fill #0

## 2018-01-05 NOTE — Discharge Instructions (Addendum)
Please follow with your primary care doctor in the next 2 days for a check-up. They must obtain records for further management.  ° °Do not hesitate to return to the Emergency Department for any new, worsening or concerning symptoms.  ° °

## 2018-01-05 NOTE — ED Triage Notes (Signed)
Pt c/o SOB x 1 week HX asthma, also c/o penis redness x 3 days

## 2018-01-05 NOTE — ED Provider Notes (Signed)
[facility] EMERGENCY DEPARTMENT Provider Note   CSN: [id number] Arrival date & time: 01/05/18  1513     History   Chief Complaint Chief Complaint  Patient presents with  . Asthma    HPI   Blood pressure (!) 169/92, pulse 92, temperature 98.4 F (36.9 C), temperature source Oral, resp. rate 18, height 5\' 9"  (1.753 m), SpO2 99 %.  Michael Foster is a 42 y.o. 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  He denies any chest pain, fever or chills.  He also states that him and his girlfriend is been passing back and forth a yeast infection he states that he has a small penis and it gets retracted into the skin.  He normally uses clotrimazole but he states that his girlfriends used fluconazole and he would like to use a pill if possible.  He denies any dysuria, urethral discharge.  He declines any STD testing today.  Patient denies polyuria and polydipsia.  Past Medical History:  Diagnosis Date  . ADD (attention deficit disorder)   . Asthma    as a child  . Diabetes mellitus Dx 2009  . Gastroenteritis   . H/O TIA (transient ischemic attack) and stroke 01/04/2015   06/2014 with L hand numbness   . Hypertension Dx 2009  . Vertigo     Patient Active Problem List   Diagnosis Date Noted  . Chest pain 07/06/2015  . Dyspnea 07/06/2015  . Screening for STD (sexually transmitted disease) 06/09/2015  . H/O TIA (transient ischemic attack) and stroke 01/04/2015  . Diabetes (HCC) 06/10/2013  . Morbid obesity (HCC) 06/10/2013  . Asthma, chronic 06/10/2013  . Backache 03/22/2010  . HYPERLIPIDEMIA 01/17/2010  . Essential hypertension 01/17/2010  . OSA on CPAP 01/17/2010      Past Surgical History:  Procedure Laterality Date  . MOUTH SURGERY          Home Medications    Prior to Admission medications   Medication Sig Start Date End Date Taking? Authorizing Provider  albuterol (PROVENTIL HFA;VENTOLIN HFA) 108 (90 Base) MCG/ACT inhaler Inhale 2 puffs into the lungs every 4 (four) hours as needed for wheezing or shortness of breath. 01/05/18   Leiya Keesey, Joni ReiningNicole, PA-C  Calcium Carbonate Antacid (ALKA-SELTZER ANTACID PO) Take 1 tablet by mouth daily as needed.    [provider]  cetirizine (ZYRTEC) 10 MG tablet Take 10 mg by mouth daily as needed for allergies.    [provider]  glucose blood (ACCU-CHEK GUIDE) test strip Use as instructed 12/10/16   Diallo, Abdoulaye, MD  Insulin Glargine (LANTUS SOLOSTAR) 100 UNIT/ML Solostar Pen Inject 20 Units into the skin daily. 10/16/17   Diallo, Lilia ArgueAbdoulaye, MD  Insulin Glargine (LANTUS SOLOSTAR) 100 UNIT/ML Solostar Pen Inject 24 Units into the skin daily at 10 pm. 11/13/17   Diallo, Lilia ArgueAbdoulaye, MD  Insulin Pen Needle 32G X 6 MM MISC 20 Units by Does not apply route daily. 10/16/17   Diallo, Lilia ArgueAbdoulaye, MD  lisdexamfetamine (VYVANSE) 30 MG capsule Take 30 mg by mouth daily.    [provider]  lisinopril (PRINIVIL,ZESTRIL) 2.5 MG tablet Take 1 tablet (2.5 mg total) by mouth daily. 06/10/17  Diallo, Lilia Argue, MD  metFORMIN (GLUCOPHAGE) 500 MG tablet Take 1 tablet (500 mg total) by mouth 2 (two) times daily with a meal. 01/05/18   Maximo Spratling, Joni Reining, PA-C  predniSONE (DELTASONE) 20 MG tablet 3 tabs po day one, then 2 tabs daily x 4 days 10/14/17   Felicie Morn, NP  UNIFINE PENTIPS 31G X 8 MM MISC USE AS DIRECTED WITH VICTOZA 06/18/17   Lovena Neighbours, MD    Family History Family History  Problem Relation Age of Onset  . COPD Father   . Hypertension Father   . Heart failure Father   . Diabetes Father   . Diabetes Mother   . Hyperlipidemia Mother   . Sleep apnea Brother   . Hypertension  Brother     Social History Social History   Tobacco Use  . Smoking status: Former Smoker    Packs/day: 0.30    Years: 10.00    Pack years: 3.00    Types: Cigarettes    Last attempt to quit: 05/28/2013    Years since quitting: 4.6  . Smokeless tobacco: Never Used  Substance Use Topics  . Alcohol use: No  . Drug use: No     Allergies   Slo-bid gyrocaps [theophylline]   Review of Systems Review of Systems  A complete review of systems was obtained and all systems are negative except as noted in the HPI and PMH.   Physical Exam Updated Vital Signs BP (!) 169/92   Pulse 92   Temp 98.4 F (36.9 C) (Oral)   Resp 18   Ht 5\' 9"  (1.753 m)   SpO2 99%   BMI 43.42 kg/m   Physical Exam  Constitutional: He is oriented to person, place, and time. He appears well-developed and well-nourished. No distress.  HENT:  Head: Normocephalic and atraumatic.  Mouth/Throat: Oropharynx is clear and moist.  Eyes: Pupils are equal, round, and reactive to light. Conjunctivae and EOM are normal.  Neck: Normal range of motion.  Cardiovascular: Normal rate, regular rhythm and intact distal pulses.  Pulmonary/Chest: Effort normal and breath sounds normal.  Abdominal: Soft. There is no tenderness.  Genitourinary:  Genitourinary Comments: GU exam is chaperoned by Technician: He has white patches on the shaft of the penis and around the glands consistent with yeast infection, no satellite lesions, no warmth, no gross urethral discharge, no testicular pain or swelling bilaterally.  Musculoskeletal: Normal range of motion.  Neurological: He is alert and oriented to person, place, and time.  Skin: He is not diaphoretic.  Psychiatric: He has a normal mood and affect.  Nursing note and vitals reviewed.    ED Treatments / Results  Labs (all labs ordered are listed, but only abnormal results are displayed) Labs Reviewed - No data to display  EKG None  Radiology No results  found.  Procedures Procedures (including critical care time)  Medications Ordered in ED Medications  fluconazole (DIFLUCAN) tablet 200 mg (has no administration in time range)  albuterol (PROVENTIL HFA;VENTOLIN HFA) 108 (90 Base) MCG/ACT inhaler 2 puff (has no administration in time range)     Initial Impression / Assessment and Plan / ED Course  I have reviewed the triage vital signs and the nursing notes.  Pertinent labs & imaging results that were available during my care of the patient were reviewed by me and considered in my medical decision making (see chart for details).    Vitals:   01/05/18 1517 01/05/18 1520  BP:  (!) 169/92  Pulse:  92  Resp:  18  Temp:  98.4 F (36.9 C)  TempSrc:  Oral  SpO2:  99%  Height: 5\' 9"  (1.753 m)     Medications  fluconazole (DIFLUCAN) tablet 200 mg (has no administration in time range)  albuterol (PROVENTIL HFA;VENTOLIN HFA) 108 (90 Base) MCG/ACT inhaler 2 puff (has no administration in time range)    Michael Foster is 42 y.o. male presenting with yeast infection, he also states that he needs a refill on his albuterol, lung sounds are clear to auscultation.  I have advised this patient he will continue to likely get yeast infections of his blood sugar is uncontrolled.  Patient denies any polyuria and polydipsia, vital signs reassuring.  He states that he cannot afford his medication if I restart him on metformin.  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  He is declined STD testing and blood glucose testing in the ED today.  Evaluation does not show pathology that would require ongoing emergent intervention or inpatient treatment. Pt is hemodynamically stable and mentating appropriately. Discussed findings and plan with patient/guardian, who agrees with care plan. All questions answered. Return precautions discussed and outpatient follow up given.       Final Clinical Impressions(s) / ED Diagnoses   Final diagnoses:  Mild asthma with exacerbation, unspecified whether persistent  Medication refill  Yeast dermatitis    ED Discharge Orders        Ordered    albuterol (PROVENTIL HFA;VENTOLIN HFA) 108 (90 Base) MCG/ACT inhaler  Every 4 hours PRN     01/05/18 1611    metFORMIN (GLUCOPHAGE) 500 MG tablet  2 times daily with meals     01/05/18 1611       Serapio Edelson, Mardella Layman 01/05/18 1616    Tilden Fossa, MD 01/05/18 1645

## 2018-03-08 ENCOUNTER — Other Ambulatory Visit: Payer: Self-pay

## 2018-03-08 ENCOUNTER — Encounter (HOSPITAL_BASED_OUTPATIENT_CLINIC_OR_DEPARTMENT_OTHER): Payer: Self-pay | Admitting: Emergency Medicine

## 2018-03-08 ENCOUNTER — Emergency Department (HOSPITAL_BASED_OUTPATIENT_CLINIC_OR_DEPARTMENT_OTHER)
Admission: EM | Admit: 2018-03-08 | Discharge: 2018-03-08 | Disposition: A | Discharge status: Home / Self Care | Payer: Self-pay | Attending: Emergency Medicine | Admitting: Emergency Medicine

## 2018-03-08 DIAGNOSIS — Y998 Other external cause status: Secondary | ICD-10-CM | POA: Insufficient documentation

## 2018-03-08 DIAGNOSIS — Z794 Long term (current) use of insulin: Secondary | ICD-10-CM | POA: Insufficient documentation

## 2018-03-08 DIAGNOSIS — S46811A Strain of other muscles, fascia and tendons at shoulder and upper arm level, right arm, initial encounter: Secondary | ICD-10-CM

## 2018-03-08 DIAGNOSIS — E119 Type 2 diabetes mellitus without complications: Secondary | ICD-10-CM | POA: Insufficient documentation

## 2018-03-08 DIAGNOSIS — Y929 Unspecified place or not applicable: Secondary | ICD-10-CM | POA: Insufficient documentation

## 2018-03-08 DIAGNOSIS — M549 Dorsalgia, unspecified: Secondary | ICD-10-CM

## 2018-03-08 DIAGNOSIS — Y33XXXA Other specified events, undetermined intent, initial encounter: Secondary | ICD-10-CM | POA: Insufficient documentation

## 2018-03-08 DIAGNOSIS — Z79899 Other long term (current) drug therapy: Secondary | ICD-10-CM | POA: Insufficient documentation

## 2018-03-08 DIAGNOSIS — J45909 Unspecified asthma, uncomplicated: Secondary | ICD-10-CM | POA: Insufficient documentation

## 2018-03-08 DIAGNOSIS — I10 Essential (primary) hypertension: Secondary | ICD-10-CM | POA: Insufficient documentation

## 2018-03-08 DIAGNOSIS — Z87891 Personal history of nicotine dependence: Secondary | ICD-10-CM | POA: Insufficient documentation

## 2018-03-08 DIAGNOSIS — M5489 Other dorsalgia: Secondary | ICD-10-CM

## 2018-03-08 DIAGNOSIS — Y939 Activity, unspecified: Secondary | ICD-10-CM | POA: Insufficient documentation

## 2018-03-08 DIAGNOSIS — Z8673 Personal history of transient ischemic attack (TIA), and cerebral infarction without residual deficits: Secondary | ICD-10-CM | POA: Insufficient documentation

## 2018-03-08 MED ORDER — METHOCARBAMOL 500 MG PO TABS: 500.0000 mg | ORAL_TABLET | Freq: Three times a day (TID) | ORAL | 0 refills | Status: DC | PRN

## 2018-03-08 MED ORDER — IBUPROFEN 800 MG PO TABS: 800.0000 mg | ORAL_TABLET | Freq: Three times a day (TID) | ORAL | 0 refills | Status: DC | PRN

## 2018-03-08 NOTE — ED Provider Notes (Signed)
[facility] EMERGENCY DEPARTMENT Provider Note   CSN: [id number] Arrival date & time: 03/08/18  0947     History   Chief Complaint Chief Complaint  Patient presents with  . Back Pain    HPI Michael Foster is a 42 y.o. male with a hx of asthma, DM, HTN, hyperlipidemia,  ADD, OSA, and stroke who presents to the emergency department with complaints of right neck/upper back pain that started yesterday and worsened this morning.  Patient states pain was somewhat present yesterday, but not substantial, this morning he felt that he rolled over incorrectly acutely worsening the pain. Pain at present is a 7/10 in severity described as sharp/tight. It is worse with movement of the RUE, alleviated by ibuprofen yesterday, no meds PTA today. He states he has a hx of similar pain which has improved with ibuprofen and muscle relaxant. Denies numbness, tingling, weakness, incontinence to bowel/bladder, fever, chills, IV drug use, or hx of cancer. Denies chest pain or dyspnea. No recent traumatic injuries.    HPI  Past Medical History:  Diagnosis Date  . ADD (attention deficit disorder)   . Asthma    as a child  . Diabetes mellitus Dx 2009  . Gastroenteritis   . H/O TIA (transient ischemic attack) and stroke 01/04/2015   06/2014 with L hand numbness   . Hypertension Dx 2009  . Vertigo     Patient Active Problem List   Diagnosis Date Noted  . Chest pain 07/06/2015  . Dyspnea 07/06/2015  . Screening for STD (sexually transmitted disease) 06/09/2015  . H/O TIA (transient ischemic attack) and stroke 01/04/2015  . Diabetes (HCC) 06/10/2013  . Morbid obesity (HCC) 06/10/2013  . Asthma, chronic 06/10/2013  . Backache 03/22/2010  . HYPERLIPIDEMIA 01/17/2010  . Essential hypertension 01/17/2010  . OSA on CPAP 01/17/2010    Past Surgical History:  Procedure Laterality Date  . MOUTH SURGERY          Home Medications    Prior to Admission medications   Medication Sig Start  Date End Date Taking? Authorizing Provider  albuterol (PROVENTIL HFA;VENTOLIN HFA) 108 (90 Base) MCG/ACT inhaler Inhale 2 puffs into the lungs every 4 (four) hours as needed for wheezing or shortness of breath. 01/05/18   Pisciotta, Joni Reining, PA-C  Calcium Carbonate Antacid (ALKA-SELTZER ANTACID PO) Take 1 tablet by mouth daily as needed.    [provider]  cetirizine (ZYRTEC) 10 MG tablet Take 10 mg by mouth daily as needed for allergies.    [provider]  glucose blood (ACCU-CHEK GUIDE) test strip Use as instructed 12/10/16   Diallo, Abdoulaye, MD  Insulin Glargine (LANTUS SOLOSTAR) 100 UNIT/ML Solostar Pen Inject 20 Units into the skin daily. 10/16/17   Diallo, Lilia Argue, MD  Insulin Glargine (LANTUS SOLOSTAR) 100 UNIT/ML Solostar Pen Inject 24 Units into the skin daily at 10 pm. 11/13/17   Diallo, Lilia Argue, MD  Insulin Pen Needle 32G X 6 MM MISC 20 Units by Does not apply route daily. 10/16/17   Diallo, Lilia Argue, MD  lisdexamfetamine (VYVANSE) 30 MG capsule Take 30 mg by mouth daily.    [provider]  lisinopril (PRINIVIL,ZESTRIL) 2.5 MG tablet Take 1 tablet (2.5 mg total) by mouth daily. 06/10/17   Diallo, Lilia Argue, MD  metFORMIN (GLUCOPHAGE) 500 MG tablet Take 1 tablet (500 mg total) by mouth 2 (two) times daily with a meal. 01/05/18   Pisciotta, Joni Reining, PA-C  predniSONE (DELTASONE) 20 MG tablet 3 tabs po day one, then 2 tabs  daily x 4 days 10/14/17   Felicie Morn, NP  Ebbie Latus PENTIPS 31G X 8 MM MISC USE AS DIRECTED WITH VICTOZA 06/18/17   Lovena Neighbours, MD    Family History Family History  Problem Relation Age of Onset  . COPD Father   . Hypertension Father   . Heart failure Father   . Diabetes Father   . Diabetes Mother   . Hyperlipidemia Mother   . Sleep apnea Brother   . Hypertension Brother     Social History Social History   Tobacco Use  . Smoking status: Former Smoker    Packs/day: 0.30    Years: 10.00    Pack years: 3.00    Types:  Cigarettes    Last attempt to quit: 05/28/2013    Years since quitting: 4.7  . Smokeless tobacco: Never Used  Substance Use Topics  . Alcohol use: No  . Drug use: No     Allergies   Slo-bid gyrocaps [theophylline]   Review of Systems Review of Systems  Constitutional: Negative for chills and fever.  Respiratory: Negative for shortness of breath.   Cardiovascular: Negative for chest pain.  Musculoskeletal: Positive for back pain and neck pain.  Neurological: Negative for weakness and numbness.       Negative for incontinence or saddle anesthesias.     Physical Exam Updated Vital Signs BP (!) 155/83 (BP Location: Left Arm)   Pulse 74   Temp 98.3 F (36.8 C) (Oral)   Resp 18   Ht 5\' 9"  (1.753 m)   Wt 136.1 kg (300 lb)   SpO2 98%   BMI 44.30 kg/m   Physical Exam  Constitutional: He appears well-developed and well-nourished.  Non-toxic appearance. No distress.  HENT:  Head: Normocephalic and atraumatic.  Eyes: Conjunctivae are normal. Right eye exhibits no discharge. Left eye exhibits no discharge.  Neck: Normal range of motion. Neck supple. Muscular tenderness (Right paraspinal muscles, specifically the trapezius.) present. No spinous process tenderness present. Normal range of motion present.  Cardiovascular: Normal rate and regular rhythm.  No murmur heard. Pulses:      Radial pulses are 2+ on the right side, and 2+ on the left side.  Pulmonary/Chest: Effort normal and breath sounds normal. No respiratory distress. He has no wheezes. He has no rhonchi. He has no rales.  Abdominal: Soft. He exhibits no distension. There is no tenderness.  Musculoskeletal:  No obvious deformities, appreciable swelling, erythema, ecchymosis, open wounds, or warmth. Upper extremities: Normal range of motion.  Nontender Back: No midline tenderness. Lower extremities: Normal range of motion nontender.  Neurological: He is alert.  Clear speech.  Patient has 5 out of 5 symmetric grip  strength.  He has 5 out of 5 strength with plantar dorsiflexion bilaterally.  Sensation grossly intact bilateral upper and lower extremities.  Gait steady and intact.  Psychiatric: He has a normal mood and affect. His behavior is normal. Thought content normal.  Nursing note and vitals reviewed.    ED Treatments / Results  Labs (all labs ordered are listed, but only abnormal results are displayed) Labs Reviewed - No data to display  EKG None  Radiology No results found.  Procedures Procedures (including critical care time)  Medications Ordered in ED Medications - No data to display   Initial Impression / Assessment and Plan / ED Course  I have reviewed the triage vital signs and the nursing notes.  Pertinent labs & imaging results that were available during my care of the  patient were reviewed by me and considered in my medical decision making (see chart for details).    Patient presents with complaint of right neck/upper back pain- tender in the right upper portion of trapezius muscle on exam.  Patient is nontoxic appearing, vitals are WNL other than elevated BP, doubt HTN emergency, patient aware of need for recheck. Patient has normal neurologic exam, no midline tenderness to palpation. He is ambulatory in the ED.  No back pain red flags. . Most likely muscle strain versus spasm. Considered disc disease, pulmonary embolism, aortic aneurysm/dissection, cauda equina or epidural abscess however these do not fit clinical picture at this time. Will treat with Ibuprofen and Robaxin, discussed with patient that they are not to drive or operate heavy machinery while taking Robaxin. I discussed treatment plan, need for PCP follow-up, and return precautions with the patient. Provided opportunity for questions, patient confirmed understanding and is in agreement with plan.   Final Clinical Impressions(s) / ED Diagnoses   Final diagnoses:  Other acute back pain  Strain of right trapezius  muscle, initial encounter    ED Discharge Orders        Ordered    ibuprofen (ADVIL,MOTRIN) 800 MG tablet  Every 8 hours PRN     03/08/18 1015    methocarbamol (ROBAXIN) 500 MG tablet  Every 8 hours PRN     03/08/18 1015       Kelcie Currie, The MeadowsSamantha R, PA-C 03/08/18 1034    Rolland PorterJames, Mark, MD 03/14/18 [id number]

## 2018-03-08 NOTE — Discharge Instructions (Signed)
Back/Neck Pain:  I have prescribed you an anti-inflammatory medication and a muscle relaxer.   Ibuprofen is a nonsteroidal anti-inflammatory medication that will help with pain and swelling. Be sure to take this medication as prescribed with food, 1 pill every 8 hours,  It should be taken with food, as it can cause stomach upset, and more seriously, stomach bleeding. Do not take other nonsteroidal anti-inflammatory medications with this such as Advil, Motrin, naproxen, mobic, or Aleve etc.   Robaxin is the muscle relaxer I have prescribed, this is meant to help with muscle tightness. Be aware that this medication may make you drowsy therefore the first time you take this it should be at a time you are in an environment where you can rest. Do not drive or operate heavy machinery when taking this medication. Do not take this medication with other sedating medications or with alcohol.  In addition you may also take Tylenol. Tylenol is generally safe, though you should not take more than 8 of the extra strength (500mg ) pills a day.  The application of heat can help soothe the pain.  Maintaining your daily activities, including walking, is encourged, as it will help you get better faster than just staying in bed.  We have prescribed you new medication(s) today. Discuss the medications prescribed today with your pharmacist as they can have adverse effects and interactions with your other medicines including over the counter and prescribed medications. Seek medical evaluation if you start to experience new or abnormal symptoms after taking one of these medicines, seek care immediately if you start to experience difficulty breathing, feeling of your throat closing, facial swelling, or rash as these could be indications of a more serious allergic reaction  Follow up with your primary care provider within 1 week for re-evaluation and for a recheck of your blood pressure as this was elevated in the ER today.  However if you develop severe or worsening pain, fever, numbness, weakness, loss of bowel or bladder control, or inability to walk or urinate, shortness of breath, or chest pain, you should return to the ER immediately.

## 2018-03-08 NOTE — ED Triage Notes (Signed)
R thoracic back pain today "after rolling out of bed wrong"

## 2018-03-11 ENCOUNTER — Ambulatory Visit: Payer: Self-pay

## 2018-03-11 ENCOUNTER — Other Ambulatory Visit: Payer: Self-pay

## 2018-03-11 ENCOUNTER — Ambulatory Visit (INDEPENDENT_AMBULATORY_CARE_PROVIDER_SITE_OTHER): Payer: Self-pay | Admitting: Family Medicine

## 2018-03-11 VITALS — BP 142/88 | HR 68 | Temp 98.7°F | Ht 69.0 in | Wt 304.0 lb

## 2018-03-11 DIAGNOSIS — E1165 Type 2 diabetes mellitus with hyperglycemia: Secondary | ICD-10-CM

## 2018-03-11 DIAGNOSIS — F329 Major depressive disorder, single episode, unspecified: Secondary | ICD-10-CM

## 2018-03-11 DIAGNOSIS — F32A Depression, unspecified: Secondary | ICD-10-CM

## 2018-03-11 LAB — GLUCOSE, POCT (MANUAL RESULT ENTRY): POC GLUCOSE: 284 mg/dL — AB (ref 70–99)

## 2018-03-11 LAB — POCT GLYCOSYLATED HEMOGLOBIN (HGB A1C): HBA1C, POC (CONTROLLED DIABETIC RANGE): 11.6 % — AB (ref 0.0–7.0)

## 2018-03-11 MED ORDER — INSULIN GLARGINE 100 UNIT/ML SOLOSTAR PEN: 24.0000 [IU] | PEN_INJECTOR | Freq: Every day | SUBCUTANEOUS | 99 refills | Status: DC

## 2018-03-11 MED ORDER — SERTRALINE HCL 50 MG PO TABS: 50.0000 mg | ORAL_TABLET | Freq: Every day | ORAL | 0 refills | Status: DC

## 2018-03-11 NOTE — Addendum Note (Signed)
Addended by: Leland HerYOO, Takima Encina J on: 03/11/2018 03:22 PM   Modules accepted: Orders

## 2018-03-11 NOTE — Patient Instructions (Addendum)
Start on zoloft 50mg  daily. Most people find a benefit in 4-6 weeks but I expect that you will feel better soon. Follow up with your regular doctor as planned in 2 weeks to see how your depression is doing. We can always go up on the dose or try Prozac  Sign up for My Chart to have easy access to your labs results, and communication with your primary care physician.  Feel free to call with any questions or concerns at any time, at [phone].   Take care,  Dr. Leland HerElsia J Yoo, DO [facility]

## 2018-03-11 NOTE — Progress Notes (Signed)
    Subjective:  Michael Foster is a 42 y.o. male [facility] with a chief complaint of depression.   HPI:  Patient states that he has a long-standing history of feeling down "for as long as I can remember" but has been feeling more depressed over this last year. Goes to see therapist Dennard Nipugene at [facility] ever since his last divorce, found CBT to be very helpful.  He journals regularly as a way to cope. Lately he feels like the number of days that he feels down are becoming more frequent and clustered together.  The most recent was that he did not receive recognition for being a father on Father's Day which was really difficult for him. He has noticed that it affects his work because he has more trouble concentrating.  He also finds that this affects his personal life as he has been taking this out on his girlfriend and he feels guilty about this to a certain degree though he knows he treats her well.   He has a family history of depression; his daughter had to be treated in an inpatient psychiatric ward for suicidal ideations and she is now on Prozac and doing well. No SI or HI.  Denies any symptoms of mania, recklessness, excessive energy.  ROS: Per HPI  Objective:  Physical Exam: BP (!) 142/88   Pulse 68   Temp 98.7 F (37.1 C) (Oral)   Ht 5\' 9"  (1.753 m)   Wt (!) 304 lb (137.9 kg)   SpO2 98%   BMI 44.89 kg/m   Gen: NAD, resting comfortably Neuro: grossly normal, moves all extremities Psych: Normal affect and thought content  Depression screen [facility] 03/11/2018  Decreased Interest 2  Down, Depressed, Hopeless 2  PHQ - 2 Score 4  Altered sleeping 1  Tired, decreased energy 1  Change in appetite 2  Feeling bad or failure about yourself  2  Trouble concentrating 2  Moving slowly or fidgety/restless 2  Suicidal thoughts 0  PHQ-9 Score 14  Difficult doing work/chores Very difficult    Assessment/Plan:  1. Depression, unspecified depression  type Patient meets criteria for depression.  He is already established with behavioral health.  Start Zoloft and follow-up with PCP as already scheduled in 2 weeks. - sertraline (ZOLOFT) 50 MG tablet; Take 1 tablet (50 mg total) by mouth daily.  Dispense: 30 tablet; Refill: 0  Leland HerElsia J Hanish Laraia, DO PGY-2, [facility] 03/11/2018 2:23 PM

## 2018-03-25 ENCOUNTER — Ambulatory Visit (INDEPENDENT_AMBULATORY_CARE_PROVIDER_SITE_OTHER): Payer: Self-pay | Admitting: Family Medicine

## 2018-03-25 ENCOUNTER — Encounter: Payer: Self-pay | Admitting: Family Medicine

## 2018-03-25 DIAGNOSIS — F329 Major depressive disorder, single episode, unspecified: Secondary | ICD-10-CM

## 2018-03-25 DIAGNOSIS — E1165 Type 2 diabetes mellitus with hyperglycemia: Secondary | ICD-10-CM

## 2018-03-25 DIAGNOSIS — F32A Depression, unspecified: Secondary | ICD-10-CM

## 2018-03-25 DIAGNOSIS — I10 Essential (primary) hypertension: Secondary | ICD-10-CM

## 2018-03-25 MED ORDER — LISINOPRIL 2.5 MG PO TABS: 2.5000 mg | ORAL_TABLET | Freq: Every day | ORAL | 11 refills | Status: DC

## 2018-03-25 MED ORDER — SERTRALINE HCL 50 MG PO TABS: 50.0000 mg | ORAL_TABLET | Freq: Every day | ORAL | 0 refills | Status: DC

## 2018-03-25 MED ORDER — ALBUTEROL SULFATE HFA 108 (90 BASE) MCG/ACT IN AERS: 2.0000 | INHALATION_SPRAY | RESPIRATORY_TRACT | 4 refills | Status: DC | PRN

## 2018-03-25 MED ORDER — METFORMIN HCL 500 MG PO TABS
500.0000 mg | ORAL_TABLET | Freq: Two times a day (BID) | ORAL | 0 refills | Status: DC
Start: 2018-03-25 — End: 2018-05-08

## 2018-03-25 NOTE — Patient Instructions (Signed)
It was great seeing you today! We have addressed the following issues today  1. Continue with Lantus 20 units daily and metformin. 2. Call in 10 days so we can provide sample for you. 3. Exercise and diet as usual to help control your blood glucose. 4. I will see you in two months for an A1c check.  If we did any lab work today, and the results require attention, either me or my nurse will get in touch with you. If everything is normal, you will get a letter in mail and a message via . If you don't hear from us in two weeks, please give us a call. Otherwise, we look forward to seeing you again at your next visit. If you have any questions or concerns before then, please call the clinic at [phone].  Please bring all your medications to every doctors visit  Sign up for My Chart to have easy access to your labs results, and communication with your Primary care physician. Please ask Front Desk for some assistance.   Please check-out at the front desk before leaving the clinic.    Take Care,   Dr. Sydnee Cabaliallo

## 2018-03-25 NOTE — Assessment & Plan Note (Signed)
Patient presents today to discuss recent A1c result.  Patient has had trouble affording medication and has been nonadherent to insulin regimen and metformin.  A1c reflects poor glycemic control.  Now that patient has insurance, he is able to afford Lantus.  He recently was able to obtain one-point and has been using it for the past few days.  Will provide samples in the next week or 2 until insurance kicks in.  Continue to recommend dietary modifications as well as exercise regimen routinely.   --Plan to see patient in 3874-month for A1c recheck --Continue Lantus 20 units daily --Continue metformin 1000 mg twice daily

## 2018-03-25 NOTE — Progress Notes (Signed)
   Subjective:    Patient ID: Michael Foster, male    DOB: 10/22/1975, 42 y.o.   MRN: [id number]   CC: Follow-up for type 2 DM management  HPI: Patient is a 42 year old male past medical history significant for type 2 diabetes, depression, asthma, who presents today to follow-up on diabetes management.  Patient last A1c was 11.6 (6/19).  Prior to that the A1c was 11.9.  Patient was supposed to be on insulin 20 units daily as well as metformin.  Due to lack of insurance patient was not able to afford insulin for the past 3 months.  He has tried to modify his diet and exercise but unsuccessfully.  Patient denies any polyuria polydipsia.  Patient was recently hired and has finally obtain health insurance and will be able to afford medication.  He does not have any acute complaints today.  Smoking status reviewed   ROS: all other systems were reviewed and are negative other than in the HPI   Past Medical History:  Diagnosis Date  . ADD (attention deficit disorder)   . Asthma    as a child  . Diabetes mellitus Dx 2009  . Gastroenteritis   . H/O TIA (transient ischemic attack) and stroke 01/04/2015   06/2014 with L hand numbness   . Hypertension Dx 2009  . Vertigo     Past Surgical History:  Procedure Laterality Date  . MOUTH SURGERY      Past medical history, surgical, family, and social history reviewed and updated in the EMR as appropriate.  Objective:  BP 118/80   Pulse 98   Temp 98.7 F (37.1 C) (Oral)   Ht 5\' 9"  (1.753 m)   Wt 293 lb (132.9 kg)   SpO2 98%   BMI 43.27 kg/m   Vitals and nursing note reviewed  General: NAD, pleasant, able to participate in exam Cardiac: RRR, normal heart sounds, no murmurs. 2+ radial and PT pulses bilaterally Respiratory: CTAB, normal effort, No wheezes, rales or rhonchi Abdomen: soft, nontender, nondistended, no hepatic or splenomegaly, +BS Extremities: no edema or cyanosis. WWP. Skin: warm and dry, no rashes noted Neuro: alert and  oriented x4, no focal deficits Psych: Normal affect and mood   Assessment & Plan:    Diabetes [facility](HCC) Patient presents today to discuss recent A1c result.  Patient has had trouble affording medication and has been nonadherent to insulin regimen and metformin.  A1c reflects poor glycemic control.  Now that patient has insurance, he is able to afford Lantus.  He recently was able to obtain one-point and has been using it for the past few days.  Will provide samples in the next week or 2 until insurance kicks in.  Continue to recommend dietary modifications as well as exercise regimen routinely.   --Plan to see patient in 4944-month for A1c recheck --Continue Lantus 20 units daily --Continue metformin 1000 mg twice daily    Lovena NeighboursAbdoulaye Vickki Igou, MD [facility] PGY-2

## 2018-05-08 ENCOUNTER — Other Ambulatory Visit: Payer: Self-pay

## 2018-05-08 ENCOUNTER — Emergency Department (HOSPITAL_BASED_OUTPATIENT_CLINIC_OR_DEPARTMENT_OTHER)
Admission: EM | Admit: 2018-05-08 | Discharge: 2018-05-08 | Disposition: A | Discharge status: Home / Self Care | Payer: 59 | Attending: Emergency Medicine | Admitting: Emergency Medicine

## 2018-05-08 ENCOUNTER — Encounter (HOSPITAL_BASED_OUTPATIENT_CLINIC_OR_DEPARTMENT_OTHER): Payer: Self-pay | Admitting: Emergency Medicine

## 2018-05-08 DIAGNOSIS — Z87891 Personal history of nicotine dependence: Secondary | ICD-10-CM | POA: Diagnosis not present

## 2018-05-08 DIAGNOSIS — J45909 Unspecified asthma, uncomplicated: Secondary | ICD-10-CM | POA: Diagnosis not present

## 2018-05-08 DIAGNOSIS — I10 Essential (primary) hypertension: Secondary | ICD-10-CM | POA: Diagnosis not present

## 2018-05-08 DIAGNOSIS — E1165 Type 2 diabetes mellitus with hyperglycemia: Secondary | ICD-10-CM | POA: Diagnosis not present

## 2018-05-08 DIAGNOSIS — Z79899 Other long term (current) drug therapy: Secondary | ICD-10-CM | POA: Diagnosis not present

## 2018-05-08 DIAGNOSIS — R739 Hyperglycemia, unspecified: Secondary | ICD-10-CM

## 2018-05-08 DIAGNOSIS — Z9114 Patient's other noncompliance with medication regimen: Secondary | ICD-10-CM | POA: Insufficient documentation

## 2018-05-08 DIAGNOSIS — Z7689 Persons encountering health services in other specified circumstances: Secondary | ICD-10-CM

## 2018-05-08 LAB — COMPREHENSIVE METABOLIC PANEL
ALT: 55 U/L — AB (ref 0–44)
AST: 30 U/L (ref 15–41)
Albumin: 3.7 g/dL (ref 3.5–5.0)
Alkaline Phosphatase: 123 U/L (ref 38–126)
Anion gap: 8 (ref 5–15)
BUN: 12 mg/dL (ref 6–20)
CHLORIDE: 101 mmol/L (ref 98–111)
CO2: 24 mmol/L (ref 22–32)
CREATININE: 0.62 mg/dL (ref 0.61–1.24)
Calcium: 8.5 mg/dL — ABNORMAL LOW (ref 8.9–10.3)
GFR calc Af Amer: >60 mL/min (ref >60)
GFR calc non Af Amer: >60 mL/min (ref >60)
Glucose, Bld: 475 mg/dL — ABNORMAL HIGH (ref 70–99)
POTASSIUM: 4.2 mmol/L (ref 3.5–5.1)
SODIUM: 133 mmol/L — AB (ref 135–145)
Total Bilirubin: 0.6 mg/dL (ref 0.3–1.2)
Total Protein: 7.7 g/dL (ref 6.5–8.1)

## 2018-05-08 LAB — URINALYSIS, ROUTINE W REFLEX MICROSCOPIC
BILIRUBIN URINE: NEGATIVE
Glucose, UA: >=500 mg/dL — AB
HGB URINE DIPSTICK: NEGATIVE
Ketones, ur: NEGATIVE mg/dL
Leukocytes, UA: NEGATIVE
Nitrite: NEGATIVE
PH: 6.5 (ref 5.0–8.0)
Protein, ur: NEGATIVE mg/dL

## 2018-05-08 LAB — URINALYSIS, MICROSCOPIC (REFLEX)
Squamous Epithelial / LPF: NONE SEEN (ref 0–5)
WBC UA: NONE SEEN WBC/hpf (ref 0–5)

## 2018-05-08 LAB — CBC
HCT: 45.4 % (ref 39.0–52.0)
Hemoglobin: 15.4 g/dL (ref 13.0–17.0)
MCH: 27.5 pg (ref 26.0–34.0)
MCHC: 33.9 g/dL (ref 30.0–36.0)
MCV: 80.9 fL (ref 78.0–100.0)
PLATELETS: 334 10*3/uL (ref 150–400)
RBC: 5.61 MIL/uL (ref 4.22–5.81)
RDW: 13.5 % (ref 11.5–15.5)
WBC: 8.7 10*3/uL (ref 4.0–10.5)

## 2018-05-08 LAB — CBG MONITORING, ED
GLUCOSE-CAPILLARY: 472 mg/dL — AB (ref 70–99)
GLUCOSE-CAPILLARY: 288 mg/dL — AB (ref 70–99)

## 2018-05-08 MED ORDER — SODIUM CHLORIDE 0.9 % IV BOLUS
1000.0000 mL | Freq: Once | INTRAVENOUS | Status: AC
  Administered 2018-05-08: 1000 mL via INTRAVENOUS

## 2018-05-08 MED ORDER — INSULIN GLARGINE 100 UNIT/ML SOLOSTAR PEN: 24.0000 [IU] | PEN_INJECTOR | Freq: Every day | SUBCUTANEOUS | 99 refills | Status: DC

## 2018-05-08 MED ORDER — LISINOPRIL 2.5 MG PO TABS: 2.5000 mg | ORAL_TABLET | Freq: Every day | ORAL | 11 refills | Status: DC

## 2018-05-08 MED ORDER — GLUCOSE BLOOD VI STRP: ORAL_STRIP | 12 refills | Status: DC

## 2018-05-08 MED ORDER — METFORMIN HCL 500 MG PO TABS: 500.0000 mg | ORAL_TABLET | Freq: Two times a day (BID) | ORAL | 0 refills | Status: DC

## 2018-05-08 MED ORDER — INSULIN PEN NEEDLE 32G X 6 MM MISC
20.0000 [IU] | Freq: Every day | 0 refills | Status: DC
Start: 2018-05-08 — End: 2020-08-29

## 2018-05-08 MED FILL — metFORMIN HCL 500 MG TABS: 500 | 30 days supply | Qty: 60 | Fill #0

## 2018-05-08 MED FILL — LISINOPRIL 2.5 MG TABLET: 2.5 | 90 days supply | Qty: 90 | Fill #0

## 2018-05-08 NOTE — ED Triage Notes (Signed)
Hyperglycemia after drinking regular soda.  Pt is also in-between insurance companies.

## 2018-05-08 NOTE — Discharge Instructions (Addendum)
Thank you for allowing me to care for you today in the Emergency Department.   For the first week, take 1 tablet of metformin at night.  Starting next week, take 1 tablet at night with a meal and 1 tablet in the morning with a meal.  Taking this with food can also help associated symptoms.   I have provided you a refill of your other home medications as requested.  [facility] has a pharmacy and can also serve as your primary care until your medical insurance kicks in.  Return to the emergency department if you develop persistent vomiting, severe abdominal pain, confusion, or if you are thirsty or peeing all the time, or if your blood sugar gets high when he checked at home.

## 2018-05-08 NOTE — ED Provider Notes (Signed)
[facility] EMERGENCY DEPARTMENT Provider Note   CSN: [id number] Arrival date & time: 05/08/18  1100     History   Chief Complaint Chief Complaint  Patient presents with  . Hyperglycemia    HPI Michael Foster is a 42 y.o. male with a h/o of Dm Type II, asthma, HTN, and depression who presents to the emergency department with a chief complaint of "my sugar is high."  The patient states he was mistakenly given a regular 38 oz. Soda instead of a diet soda.  After finishing the soda, he began to feel generally unwell so he checked his blood sugar and received a reading of 580 and came to the ED.   He reports that he has been out of his home Lantus and metformin for some time.  He just started a new job 2 months ago and his medical insurance has not kicked in so he is currently between [employer] companies.  He was receiving samples of Lantus from his PCP, but has not received any for at least one month.  He denies confusion, nausea, vomiting, abdominal pain, polydipsia, or polyphagia.  He endorses polyuria.   No treatment prior to arrival.  The history is provided by the patient. No language interpreter was used.    Past Medical History:  Diagnosis Date  . ADD (attention deficit disorder)   . Asthma    as a child  . Diabetes mellitus Dx 2009  . Gastroenteritis   . H/O TIA (transient ischemic attack) and stroke 01/04/2015   06/2014 with L hand numbness   . Hypertension Dx 2009  . Vertigo     Patient Active Problem List   Diagnosis Date Noted  . Depression 03/11/2018  . Chest pain 07/06/2015  . Dyspnea 07/06/2015  . Screening for STD (sexually transmitted disease) 06/09/2015  . H/O TIA (transient ischemic attack) and stroke 01/04/2015  . Diabetes (HCC) 06/10/2013  . Morbid obesity (HCC) 06/10/2013  . Asthma, chronic 06/10/2013  . Backache 03/22/2010  . HYPERLIPIDEMIA 01/17/2010  . Essential hypertension 01/17/2010  . OSA on CPAP 01/17/2010    Past Surgical  History:  Procedure Laterality Date  . MOUTH SURGERY          Home Medications    Prior to Admission medications   Medication Sig Start Date End Date Taking? Authorizing Provider  albuterol (PROVENTIL HFA;VENTOLIN HFA) 108 (90 Base) MCG/ACT inhaler Inhale 2 puffs into the lungs every 4 (four) hours as needed for wheezing or shortness of breath. 03/25/18  Yes Diallo, Abdoulaye, MD  sertraline (ZOLOFT) 50 MG tablet Take 1 tablet (50 mg total) by mouth daily. 03/25/18  Yes Diallo, Abdoulaye, MD  glucose blood (ACCU-CHEK GUIDE) test strip Use as instructed 05/08/18   Kaiel Weide A, PA-C  Insulin Glargine (LANTUS SOLOSTAR) 100 UNIT/ML Solostar Pen Inject 24 Units into the skin daily at 10 pm. 05/08/18   Liesl Simons A, PA-C  Insulin Pen Needle 32G X 6 MM MISC 20 Units by Does not apply route daily. 05/08/18   Gerrica Cygan A, PA-C  lisinopril (PRINIVIL,ZESTRIL) 2.5 MG tablet Take 1 tablet (2.5 mg total) by mouth daily. 05/08/18   Mont Jagoda A, PA-C  metFORMIN (GLUCOPHAGE) 500 MG tablet Take 1 tablet (500 mg total) by mouth 2 (two) times daily with a meal. 05/08/18   Osher Oettinger A, PA-C    Family History Family History  Problem Relation Age of Onset  . COPD Father   . Hypertension Father   . Heart  failure Father   . Diabetes Father   . Diabetes Mother   . Hyperlipidemia Mother   . Sleep apnea Brother   . Hypertension Brother     Social History Social History   Tobacco Use  . Smoking status: Former Smoker    Packs/day: 0.30    Years: 10.00    Pack years: 3.00    Types: Cigarettes    Last attempt to quit: 05/28/2013    Years since quitting: 4.9  . Smokeless tobacco: Never Used  Substance Use Topics  . Alcohol use: No  . Drug use: No     Allergies   Slo-bid gyrocaps [theophylline]   Review of Systems Review of Systems  Constitutional: Negative for appetite change and fever.  Respiratory: Negative for shortness of breath.   Cardiovascular: Negative for chest pain.    Gastrointestinal: Negative for abdominal pain.  Endocrine: Positive for polyuria. Negative for polydipsia and polyphagia.       Hyperglycemia  Genitourinary: Negative for dysuria.  Musculoskeletal: Negative for back pain.       Malaise  Skin: Negative for rash.  Allergic/Immunologic: Negative for immunocompromised state.  Neurological: Negative for headaches.  Psychiatric/Behavioral: Negative for confusion.     Physical Exam Updated Vital Signs BP (!) 154/82 (BP Location: Right Arm)   Pulse 70   Temp 98.3 F (36.8 C) (Oral)   Resp 16   Ht 5\' 9"  (1.753 m)   Wt 132.9 kg   SpO2 98%   BMI 43.27 kg/m   Physical Exam  Constitutional: He appears well-developed. No distress.  HENT:  Head: Normocephalic.  Eyes: Conjunctivae are normal.  Neck: Neck supple.  Cardiovascular: Normal rate, regular rhythm, normal heart sounds and intact distal pulses. Exam reveals no gallop and no friction rub.  No murmur heard. Pulmonary/Chest: Effort normal and breath sounds normal. No stridor. No respiratory distress. He has no wheezes. He has no rales. He exhibits no tenderness.  Abdominal: Soft. He exhibits no distension and no mass. There is no tenderness. There is no rebound and no guarding. No hernia.  Obese abdomen  Neurological: He is alert.  Skin: Skin is warm and dry. He is not diaphoretic.  Psychiatric: His behavior is normal.  Nursing note and vitals reviewed.    ED Treatments / Results  Labs (all labs ordered are listed, but only abnormal results are displayed) Labs Reviewed  COMPREHENSIVE METABOLIC PANEL - Abnormal; Notable for the following components:      Result Value   Sodium 133 (*)    Glucose, Bld 475 (*)    Calcium 8.5 (*)    ALT 55 (*)    All other components within normal limits  URINALYSIS, ROUTINE W REFLEX MICROSCOPIC - Abnormal; Notable for the following components:   Specific Gravity, Urine <1.005 (*)    Glucose, UA >=500 (*)    All other components within  normal limits  URINALYSIS, MICROSCOPIC (REFLEX) - Abnormal; Notable for the following components:   Bacteria, UA RARE (*)    All other components within normal limits  CBG MONITORING, ED - Abnormal; Notable for the following components:   Glucose-Capillary 472 (*)    All other components within normal limits  CBG MONITORING, ED - Abnormal; Notable for the following components:   Glucose-Capillary 288 (*)    All other components within normal limits  CBC  CBG MONITORING, ED    EKG None  Radiology No results found.  Procedures Procedures (including critical care time)  Medications Ordered in  ED Medications  sodium chloride 0.9 % bolus 1,000 mL ( Intravenous Stopped 05/08/18 1241)  sodium chloride 0.9 % bolus 1,000 mL (0 mLs Intravenous Stopped 05/08/18 1405)     Initial Impression / Assessment and Plan / ED Course  I have reviewed the triage vital signs and the nursing notes.  Pertinent labs & imaging results that were available during my care of the patient were reviewed by me and considered in my medical decision making (see chart for details).     42 year old male with a h/o of Dm Type II, asthma, HTN, and depression presenting with hyperglycemia.  His blood sugar was 580 at home this morning, 475 in the ED.  Anion gap is 8.  Doubt DKA or HHS.  2 L of normal saline given with improvement of CBG 288.  Hyperglycemia is likely secondary to noncompliance with his home medications due to cost.  The patient has requested a refill of all of his home medications.  He has started a new job and will likely have medical insurance within the next 1 to 2 months.  Will [facility] the [facility] to [facility] in the interim.  Strict return precautions given.  The patient is hemodynamically stable and in no acute distress.  He is safe for discharge to home with outpatient follow-up at this time.  Final Clinical Impressions(s) / ED Diagnoses   Final diagnoses:  Hyperglycemia    ED  Discharge Orders         Ordered    glucose blood (ACCU-CHEK GUIDE) test strip     05/08/18 1151    Insulin Glargine (LANTUS SOLOSTAR) 100 UNIT/ML Solostar Pen  Daily at 10 pm     05/08/18 1151    Insulin Pen Needle 32G X 6 MM MISC  Daily     05/08/18 1151    lisinopril (PRINIVIL,ZESTRIL) 2.5 MG tablet  Daily     05/08/18 1151    metFORMIN (GLUCOPHAGE) 500 MG tablet  2 times daily with meals     05/08/18 1151           Eileen Kangas A, PA-C 05/08/18 1604    Loren RacerYelverton, David, MD 05/10/18 1517

## 2018-05-15 ENCOUNTER — Other Ambulatory Visit: Payer: Self-pay | Admitting: Family Medicine

## 2018-05-15 DIAGNOSIS — F329 Major depressive disorder, single episode, unspecified: Secondary | ICD-10-CM

## 2018-05-15 DIAGNOSIS — F32A Depression, unspecified: Secondary | ICD-10-CM

## 2018-05-27 ENCOUNTER — Telehealth: Payer: Self-pay

## 2018-05-27 NOTE — Telephone Encounter (Signed)
Patient was started on Lantus by the ED. He now has insurance and the preferred is [place] or Hospital doctor.  Please prescribe one of the alternatives along with the appropriate needle.  Call back is [phone]  Ples Specter, RN [facility] RN)

## 2018-05-28 ENCOUNTER — Other Ambulatory Visit: Payer: Self-pay | Admitting: Family Medicine

## 2018-05-28 DIAGNOSIS — E1165 Type 2 diabetes mellitus with hyperglycemia: Secondary | ICD-10-CM

## 2018-05-28 MED ORDER — INSULIN DEGLUDEC 100 UNIT/ML ~~LOC~~ SOPN: 24.0000 [IU] | PEN_INJECTOR | Freq: Every day | SUBCUTANEOUS | 1 refills | Status: DC

## 2018-05-28 NOTE — Telephone Encounter (Signed)
Michael Foster was sent to his pharmacy. Not sure the appropriate needle I should order but once he receives the pen he can let me know which ones he needs.  Thank you  Lovena Neighbours, MD [facility], PGY-3

## 2018-05-28 NOTE — Telephone Encounter (Signed)
Pt aware of prescription. Pt stated he has needles at this time and does not need anymore.

## 2018-06-01 ENCOUNTER — Ambulatory Visit (INDEPENDENT_AMBULATORY_CARE_PROVIDER_SITE_OTHER): Payer: 59 | Admitting: Adult Health

## 2018-06-01 ENCOUNTER — Encounter: Payer: Self-pay | Admitting: Adult Health

## 2018-06-01 DIAGNOSIS — G4733 Obstructive sleep apnea (adult) (pediatric): Secondary | ICD-10-CM | POA: Diagnosis not present

## 2018-06-01 DIAGNOSIS — J4520 Mild intermittent asthma, uncomplicated: Secondary | ICD-10-CM

## 2018-06-01 DIAGNOSIS — Z9989 Dependence on other enabling machines and devices: Secondary | ICD-10-CM | POA: Diagnosis not present

## 2018-06-01 NOTE — Assessment & Plan Note (Signed)
Wt loss  

## 2018-06-01 NOTE — Assessment & Plan Note (Signed)
Doing well without flare . Control for triggers   Plan  Albuterol As needed

## 2018-06-01 NOTE — Assessment & Plan Note (Signed)
Excellent control on CPAP   Plan  Patient Instructions  Continue on CPAP at bedtime Work on healthy weight Do not drive a sleepy May use albuterol 2 puffs every 4 hours as needed for wheezing-this is your emergency inhaler Follow-up with Dr. Vassie Loll in 1 year and as needed

## 2018-06-01 NOTE — Patient Instructions (Signed)
Continue on CPAP at bedtime Work on healthy weight Do not drive a sleepy May use albuterol 2 puffs every 4 hours as needed for wheezing-this is your emergency inhaler Follow-up with Dr. Vassie Loll in 1 year and as needed

## 2018-06-01 NOTE — Progress Notes (Signed)
@Patient  ID: Michael Foster, male    DOB: 07/30/1976, 42 y.o.   MRN: [id number]  Chief Complaint  Patient presents with  . 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  Patient has underlying intermittent asthma.  Uses albuterol on occasion. No increased use.  Denies any increased shortness of breath or change in activity level.  On ACE inhibitor , denies cough or wheezing .  Has some nasal drainage , started taking Zyrtec which is helping .   Patient has underlying sleep apnea.  Is on CPAP at bedtime.  Feels rested.  No significant daytime sleepiness.  Download shows excellent compliance with average usage at 6hr .   Has recently gotten insurance , was without for 8 months . Very happy with his new job.  Was not taking meds or insulin . Has reestablished with PCP . BS are getting better.      Allergies  Allergen Reactions  . Slo-Bid Gyrocaps [Theophylline] Other (See Comments)    Unknown childhood reaction    Immunization History  Administered Date(s) Administered  . Hepatitis B 10/02/1999, 11/06/1999, 04/01/2000  . Influenza Split 06/20/2013  . Influenza Whole 06/26/2009  . Pneumococcal Polysaccharide-23 08/23/2009, 01/04/2015  . Td 09/23/2006    Past Medical History:  Diagnosis Date  . ADD (attention deficit disorder)   . Asthma    as a child  . Diabetes mellitus Dx 2009  . Gastroenteritis   . H/O TIA (transient ischemic attack) and stroke 01/04/2015   06/2014 with L hand numbness   . Hypertension Dx 2009  . Vertigo     Tobacco  History: Social History   Tobacco Use  Smoking Status Former Smoker  . Packs/day: 0.30  . Years: 10.00  . Pack years: 3.00  . Types: Cigarettes  . Last attempt to quit: 05/28/2013  . Years since quitting: 5.0  Smokeless Tobacco Never Used   Counseling given: Not Answered   Outpatient Medications Prior to Visit  Medication Sig Dispense Refill  . albuterol (PROVENTIL HFA;VENTOLIN HFA) 108 (90 Base) MCG/ACT inhaler Inhale 2 puffs into the lungs every 4 (four) hours as needed for wheezing or shortness of breath. 1 Inhaler 4  . glucose blood (ACCU-CHEK GUIDE) test strip Use as instructed 100 each 12  . insulin degludec (TRESIBA FLEXTOUCH) 100 UNIT/ML SOPN FlexTouch Pen Inject 0.24 mLs (24 Units total) into the skin daily. 5 pen 1  . Insulin Glargine (LANTUS SOLOSTAR) 100 UNIT/ML Solostar Pen Inject 24 Units into the skin daily at 10 pm. 1 pen PRN  . Insulin Pen Needle 32G X 6 MM MISC 20 Units by Does not apply route daily. 50 each 0  . lisinopril (PRINIVIL,ZESTRIL) 2.5 MG tablet Take 1 tablet (2.5 mg total) by mouth daily. 30 tablet 11  . metFORMIN (GLUCOPHAGE) 500 MG tablet Take 1 tablet (500 mg total) by mouth 2 (two) times daily with a meal. 60 tablet 0  . sertraline (ZOLOFT) 50 MG tablet TAKE 1 TABLET(50 MG) BY MOUTH DAILY 30 tablet  0  . sertraline (ZOLOFT) 50 MG tablet TAKE 1 TABLET(50 MG) BY MOUTH DAILY 30 tablet 0   No facility-administered medications prior to visit.      Review of Systems  Constitutional:   No  weight loss, night sweats,  Fevers, chills, fatigue, or  lassitude.  HEENT:   No headaches,  Difficulty swallowing,  Tooth/dental problems, or  Sore throat,                No sneezing, itching, ear ache,  +nasal congestion, post nasal drip,   CV:  No chest pain,  Orthopnea, PND, swelling in lower extremities, anasarca, dizziness, palpitations, syncope.   GI  No heartburn, indigestion, abdominal pain, nausea, vomiting, diarrhea, change in bowel habits, loss of  appetite, bloody stools.   Resp:  .  No chest wall deformity  Skin: no rash or lesions.  GU: no dysuria, change in color of urine, no urgency or frequency.  No flank pain, no hematuria   MS:  No joint pain or swelling.  No decreased range of motion.  No back pain.    Physical Exam  BP 118/82 (BP Location: Left Arm, Patient Position: Sitting, Cuff Size: Normal)   Pulse 83   Ht 5' 9.69" (1.77 m)   Wt 297 lb 9.6 oz (135 kg)   SpO2 97%   BMI 43.09 kg/m   GEN: A/Ox3; pleasant , NAD, morbidly obese    HEENT:  /AT,  EACs-clear, TMs-wnl, NOSE-clear, THROAT-clear, no lesions, no postnasal drip or exudate noted. Class 3 MP airway , 2+ tonsils   NECK:  Supple w/ fair ROM; no JVD; normal carotid impulses w/o bruits; no thyromegaly or nodules palpated; no lymphadenopathy.    RESP  Clear  P & A; w/o, wheezes/ rales/ or rhonchi. no accessory muscle use, no dullness to percussion  CARD:  RRR, no m/r/g, no peripheral edema, pulses intact, no cyanosis or clubbing.  GI:   Soft & nt; nml bowel sounds; no organomegaly or masses detected.   Musco: Warm bil, no deformities or joint swelling noted.   Neuro: alert, no focal deficits noted.    Skin: Warm, no lesions or rashes    Lab Results:  CBC   BNP No results found for: BNP  ProBNP No results found for: PROBNP  Imaging: No results found.   Assessment & Plan:   OSA on CPAP Excellent control on CPAP   Plan  Patient Instructions  Continue on CPAP at bedtime Work on healthy weight Do not drive a sleepy May use albuterol 2 puffs every 4 hours as needed for wheezing-this is your emergency inhaler Follow-up with Dr. Vassie Loll in 1 year and as needed     Morbid obesity Wt loss   Asthma, chronic Doing well without flare . Control for triggers   Plan  Albuterol As needed       Rubye Oaks, NP 06/01/2018

## 2018-06-17 ENCOUNTER — Other Ambulatory Visit: Payer: Self-pay | Admitting: Family Medicine

## 2018-06-17 DIAGNOSIS — F32A Depression, unspecified: Secondary | ICD-10-CM

## 2018-06-17 DIAGNOSIS — E1165 Type 2 diabetes mellitus with hyperglycemia: Secondary | ICD-10-CM

## 2018-06-17 DIAGNOSIS — F329 Major depressive disorder, single episode, unspecified: Secondary | ICD-10-CM

## 2018-07-20 ENCOUNTER — Other Ambulatory Visit: Payer: Self-pay | Admitting: Family Medicine

## 2018-07-20 DIAGNOSIS — F32A Depression, unspecified: Secondary | ICD-10-CM

## 2018-07-20 DIAGNOSIS — F329 Major depressive disorder, single episode, unspecified: Secondary | ICD-10-CM

## 2018-08-05 ENCOUNTER — Other Ambulatory Visit: Payer: Self-pay | Admitting: Family Medicine

## 2018-08-05 MED ORDER — METFORMIN HCL 500 MG PO TABS: 500.0000 mg | ORAL_TABLET | Freq: Two times a day (BID) | ORAL | 0 refills | Status: DC

## 2018-08-05 NOTE — Telephone Encounter (Signed)
Pt would like to have his Metformin refilled. [facility] [address].

## 2018-08-15 ENCOUNTER — Other Ambulatory Visit: Payer: Self-pay | Admitting: Family Medicine

## 2018-08-15 DIAGNOSIS — F329 Major depressive disorder, single episode, unspecified: Secondary | ICD-10-CM

## 2018-08-15 DIAGNOSIS — F32A Depression, unspecified: Secondary | ICD-10-CM

## 2018-08-19 ENCOUNTER — Other Ambulatory Visit: Payer: Self-pay

## 2018-08-19 ENCOUNTER — Other Ambulatory Visit (HOSPITAL_COMMUNITY)
Admission: RE | Admit: 2018-08-19 | Discharge: 2018-08-19 | Disposition: A | Discharge status: Home / Self Care | Payer: 59 | Source: Ambulatory Visit | Attending: Family Medicine | Admitting: Family Medicine

## 2018-08-19 ENCOUNTER — Encounter: Payer: Self-pay | Admitting: Family Medicine

## 2018-08-19 ENCOUNTER — Ambulatory Visit (INDEPENDENT_AMBULATORY_CARE_PROVIDER_SITE_OTHER): Payer: 59 | Admitting: Family Medicine

## 2018-08-19 VITALS — BP 132/72 | HR 83 | Temp 98.7°F | Ht 70.0 in | Wt 307.4 lb

## 2018-08-19 DIAGNOSIS — E1165 Type 2 diabetes mellitus with hyperglycemia: Secondary | ICD-10-CM | POA: Diagnosis not present

## 2018-08-19 DIAGNOSIS — Z113 Encounter for screening for infections with a predominantly sexual mode of transmission: Secondary | ICD-10-CM

## 2018-08-19 DIAGNOSIS — E119 Type 2 diabetes mellitus without complications: Secondary | ICD-10-CM | POA: Diagnosis not present

## 2018-08-19 DIAGNOSIS — B379 Candidiasis, unspecified: Secondary | ICD-10-CM | POA: Diagnosis not present

## 2018-08-19 LAB — POCT GLYCOSYLATED HEMOGLOBIN (HGB A1C): HbA1c, POC (controlled diabetic range): 11.9 % — AB (ref 0.0–7.0)

## 2018-08-19 LAB — HM DIABETES EYE EXAM

## 2018-08-19 MED ORDER — SEMAGLUTIDE(0.25 OR 0.5MG/DOS) 2 MG/1.5ML ~~LOC~~ SOPN: 0.2500 mg | PEN_INJECTOR | SUBCUTANEOUS | 0 refills | Status: DC

## 2018-08-19 MED ORDER — FLUCONAZOLE 150 MG PO TABS: 150.0000 mg | ORAL_TABLET | Freq: Once | ORAL | 0 refills | Status: AC

## 2018-08-19 NOTE — Assessment & Plan Note (Signed)
Patient is requesting STD check.  Reports being with same partner for the past 2 years. --We will check HIV, syphilis, trichomonas, gonorrhea, chlamydia. --We will follow-up on results.

## 2018-08-19 NOTE — Patient Instructions (Signed)
It was great seeing you today! We have addressed the following issues today  1. Increase your Evaristo Buryresiba to 30 Units. 2. Increase your metformin 2000 mg  3. I am starting you on Ozempic, take 0.25 mg a week for a month and increase it to 0.5 mg a week thereafter. I will see you in two month.  If we did any lab work today, and the results require attention, either me or my nurse will get in touch with you. If everything is normal, you will get a letter in mail and a message via . If you don't hear from us in two weeks, please give us a call. Otherwise, we look forward to seeing you again at your next visit. If you have any questions or concerns before then, please call the clinic at [phone].  Please bring all your medications to every doctors visit  Sign up for My Chart to have easy access to your labs results, and communication with your Primary care physician. Please ask Front Desk for some assistance.   Please check-out at the front desk before leaving the clinic.    Take Care,   Dr. Sydnee Cabaliallo

## 2018-08-19 NOTE — Assessment & Plan Note (Signed)
Started patient on Ozempic, will continue to work on therapy lifestyle changes.  Will consider nutrition consult if no improvement in the next few months.

## 2018-08-19 NOTE — Progress Notes (Signed)
Subjective:    Patient ID: Michael Foster, male    DOB: 07/07/1976, 42 y.o.   MRN: [id number]   CC: Type 2 diabetes follow-up  HPI: Patient is a 42 year old male who presents today to follow-up on type 2 diabetes management.  Patient reports that he has been compliant with his Evaristo Buryresiba 24 units for the past month after he finally obtained health insurance through his job.  Patient reports that he was not taking metformin due to GI symptoms but has resumed it recently with minimal trouble.  He is currently working a regular basis and has changed his diet.  Patient also reports recurrent yeast infection in his groin area for the past few years that has been controlled time with clotrimazole and once or twice Diflucan.  Is currently denying any chest pain, shortness of breath, abdominal pain, nausea, vomiting.  Smoking status reviewed   ROS: all other systems were reviewed and are negative other than in the HPI   Past Medical History:  Diagnosis Date  . ADD (attention deficit disorder)   . Asthma    as a child  . Diabetes mellitus Dx 2009  . Gastroenteritis   . H/O TIA (transient ischemic attack) and stroke 01/04/2015   06/2014 with L hand numbness   . Hypertension Dx 2009  . Vertigo     Past Surgical History:  Procedure Laterality Date  . MOUTH SURGERY      Past medical history, surgical, family, and social history reviewed and updated in the EMR as appropriate.  Objective:  BP 132/72   Pulse 83   Temp 98.7 F (37.1 C) (Oral)   Ht 5\' 10"  (1.778 m)   Wt (!) 307 lb 6 oz (139.4 kg)   SpO2 95%   BMI 44.10 kg/m   Vitals and nursing note reviewed  General: NAD, pleasant, able to participate in exam Cardiac: RRR, normal heart sounds, no murmurs. 2+ radial and PT pulses bilaterally Respiratory: CTAB, normal effort, No wheezes, rales or rhonchi Abdomen: soft, nontender, nondistended, no hepatic or splenomegaly, +BS Extremities: no edema or cyanosis. WWP. Skin: warm and dry, no  rashes noted Neuro: alert and oriented x4, no focal deficits Psych: Normal affect and mood   Assessment & Plan:    Screening for STD (sexually transmitted disease) Patient is requesting STD check.  Reports being with same partner for the past 2 years. --We will check HIV, syphilis, trichomonas, gonorrhea, chlamydia. --We will follow-up on results.  Diabetes (HCC) A1c today is 11.9, no change since last A1c back in June.  It is unlikely patient has been adherent to current regimen as reported.  Discussed lifestyle changes.  Will modify current regimen. --Increase Tresiba to 30 units daily --Increase metformin to 2000 mg daily --Start patient on Ozempic 0.25 mg weekly for 4 weeks increase to 0.5 mg in 4 weeks. --Patient will follow-up in 2 to 3 months for A1c check  Yeast infection Patient reports his infection on his groin area.  Given obesity discussed proper hygiene and measures to prevent recurrence.  Patient has been using clotrimazole with good results.  Patient could use baby powder to keep the area dry.  One Diflucan tablet prescribed to be used in case clotrimazole fails.  We also discussed improving glycemic control in order to prevent recurrent infection.  Morbid obesity Started patient on Ozempic, will continue to work on therapy lifestyle changes.  Will consider nutrition consult if no improvement in the next few months.    Tyeisha Dinan,  MD [facility] PGY-3

## 2018-08-19 NOTE — Assessment & Plan Note (Addendum)
Patient reports his infection on his groin area.  Given obesity discussed proper hygiene and measures to prevent recurrence.  Patient has been using clotrimazole with good results.  Patient could use baby powder to keep the area dry.  One Diflucan tablet prescribed to be used in case clotrimazole fails.  We also discussed improving glycemic control in order to prevent recurrent infection.

## 2018-08-19 NOTE — Assessment & Plan Note (Signed)
A1c today is 11.9, no change since last A1c back in June.  It is unlikely patient has been adherent to current regimen as reported.  Discussed lifestyle changes.  Will modify current regimen. --Increase Tresiba to 30 units daily --Increase metformin to 2000 mg daily --Start patient on Ozempic 0.25 mg weekly for 4 weeks increase to 0.5 mg in 4 weeks. --Patient will follow-up in 2 to 3 months for A1c check

## 2018-08-20 LAB — RPR: RPR: NONREACTIVE

## 2018-08-20 LAB — HIV ANTIBODY (ROUTINE TESTING W REFLEX): HIV Screen 4th Generation wRfx: NONREACTIVE

## 2018-08-24 ENCOUNTER — Encounter: Payer: Self-pay | Admitting: Family Medicine

## 2018-08-25 LAB — URINE CYTOLOGY ANCILLARY ONLY
CHLAMYDIA, DNA PROBE: NEGATIVE
NEISSERIA GONORRHEA: NEGATIVE
TRICH (WINDOWPATH): NEGATIVE

## 2018-08-27 ENCOUNTER — Emergency Department (HOSPITAL_BASED_OUTPATIENT_CLINIC_OR_DEPARTMENT_OTHER)
Admission: EM | Admit: 2018-08-27 | Discharge: 2018-08-27 | Disposition: A | Discharge status: Home / Self Care | Payer: 59 | Attending: Emergency Medicine | Admitting: Emergency Medicine

## 2018-08-27 ENCOUNTER — Other Ambulatory Visit: Payer: Self-pay

## 2018-08-27 ENCOUNTER — Encounter (HOSPITAL_BASED_OUTPATIENT_CLINIC_OR_DEPARTMENT_OTHER): Payer: Self-pay

## 2018-08-27 DIAGNOSIS — X500XXA Overexertion from strenuous movement or load, initial encounter: Secondary | ICD-10-CM | POA: Diagnosis not present

## 2018-08-27 DIAGNOSIS — Z79899 Other long term (current) drug therapy: Secondary | ICD-10-CM | POA: Insufficient documentation

## 2018-08-27 DIAGNOSIS — Y999 Unspecified external cause status: Secondary | ICD-10-CM | POA: Insufficient documentation

## 2018-08-27 DIAGNOSIS — J45909 Unspecified asthma, uncomplicated: Secondary | ICD-10-CM | POA: Insufficient documentation

## 2018-08-27 DIAGNOSIS — Y929 Unspecified place or not applicable: Secondary | ICD-10-CM | POA: Diagnosis not present

## 2018-08-27 DIAGNOSIS — Y9389 Activity, other specified: Secondary | ICD-10-CM | POA: Diagnosis not present

## 2018-08-27 DIAGNOSIS — Z8673 Personal history of transient ischemic attack (TIA), and cerebral infarction without residual deficits: Secondary | ICD-10-CM | POA: Diagnosis not present

## 2018-08-27 DIAGNOSIS — T148XXA Other injury of unspecified body region, initial encounter: Secondary | ICD-10-CM

## 2018-08-27 DIAGNOSIS — Z794 Long term (current) use of insulin: Secondary | ICD-10-CM | POA: Diagnosis not present

## 2018-08-27 DIAGNOSIS — M791 Myalgia, unspecified site: Secondary | ICD-10-CM | POA: Diagnosis not present

## 2018-08-27 DIAGNOSIS — E119 Type 2 diabetes mellitus without complications: Secondary | ICD-10-CM | POA: Insufficient documentation

## 2018-08-27 DIAGNOSIS — Z87891 Personal history of nicotine dependence: Secondary | ICD-10-CM | POA: Diagnosis not present

## 2018-08-27 DIAGNOSIS — I10 Essential (primary) hypertension: Secondary | ICD-10-CM | POA: Diagnosis not present

## 2018-08-27 DIAGNOSIS — S29012A Strain of muscle and tendon of back wall of thorax, initial encounter: Secondary | ICD-10-CM | POA: Diagnosis not present

## 2018-08-27 DIAGNOSIS — M546 Pain in thoracic spine: Secondary | ICD-10-CM | POA: Diagnosis present

## 2018-08-27 MED ORDER — IBUPROFEN 800 MG PO TABS: 800.0000 mg | ORAL_TABLET | Freq: Three times a day (TID) | ORAL | 0 refills | Status: AC

## 2018-08-27 MED ORDER — METHOCARBAMOL 500 MG PO TABS: 500.0000 mg | ORAL_TABLET | Freq: Two times a day (BID) | ORAL | 0 refills | Status: AC

## 2018-08-27 NOTE — Discharge Instructions (Addendum)
I have prescribed muscle relaxers for your pain, please do not drink or drive while taking this medications as they can make you drowsy.    Please follow-up with PCP in 1 week for reevaluation of your symptoms.   

## 2018-08-27 NOTE — ED Provider Notes (Signed)
[facility] EMERGENCY DEPARTMENT Provider Note   CSN: [id number] Arrival date & time: 08/27/18  1900     History   Chief Complaint Chief Complaint  Patient presents with  . Back Pain    HPI Michael Foster is a 42 y.o. male.  42 y.o male with PMH Asthma, DM presents to the ED with a chief complaint of right sided back pain x 1 week.  Ports he was carrying a computer up the stairs when he felt a pulling on the back of his right side.  He reports the pain was improving as the days went on but on Saturday when he was carrying groceries up the steps he twisted on an awkward position on the right making this pain worse.  Patient reports the pain is worse when he lays on his back or carries an object with his right arm.  Reports he has these muscle strains twice a year which are usually relieved by ibuprofen and Flexeril.  He denies any trauma, fever, or shortness of breath.     Past Medical History:  Diagnosis Date  . ADD (attention deficit disorder)   . Asthma    as a child  . Diabetes mellitus Dx 2009  . Gastroenteritis   . H/O TIA (transient ischemic attack) and stroke 01/04/2015   06/2014 with L hand numbness   . Hypertension Dx 2009  . Vertigo     Patient Active Problem List   Diagnosis Date Noted  . Yeast infection 08/19/2018  . Depression 03/11/2018  . Chest pain 07/06/2015  . Dyspnea 07/06/2015  . Screening for STD (sexually transmitted disease) 06/09/2015  . H/O TIA (transient ischemic attack) and stroke 01/04/2015  . Diabetes (HCC) 06/10/2013  . Morbid obesity (HCC) 06/10/2013  . Asthma, chronic 06/10/2013  . Backache 03/22/2010  . HYPERLIPIDEMIA 01/17/2010  . Essential hypertension 01/17/2010  . OSA on CPAP 01/17/2010    Past Surgical History:  Procedure Laterality Date  . MOUTH SURGERY          Home Medications    Prior to Admission medications   Medication Sig Start Date End Date Taking? Authorizing Provider  albuterol (PROVENTIL  HFA;VENTOLIN HFA) 108 (90 Base) MCG/ACT inhaler Inhale 2 puffs into the lungs every 4 (four) hours as needed for wheezing or shortness of breath. 03/25/18   Diallo, Lilia ArgueAbdoulaye, MD  BD PEN NEEDLE NANO U/F 32G X 4 MM MISC USE AS DIRECTED DAILY 06/17/18   Diallo, Lilia ArgueAbdoulaye, MD  glucose blood (ACCU-CHEK GUIDE) test strip Use as instructed 05/08/18   McDonald, Mia A, PA-C  ibuprofen (ADVIL,MOTRIN) 800 MG tablet Take 1 tablet (800 mg total) by mouth 3 (three) times daily for 7 days. 08/27/18 09/03/18  Claude MangesSoto, Charmeka [place], PA-C  insulin degludec (TRESIBA FLEXTOUCH) 100 UNIT/ML SOPN FlexTouch Pen Inject 0.24 mLs (24 Units total) into the skin daily. 05/28/18   Diallo, Lilia ArgueAbdoulaye, MD  Insulin Glargine (LANTUS SOLOSTAR) 100 UNIT/ML Solostar Pen Inject 24 Units into the skin daily at 10 pm. 05/08/18   McDonald, Mia A, PA-C  Insulin Pen Needle 32G X 6 MM MISC 20 Units by Does not apply route daily. 05/08/18   McDonald, Mia A, PA-C  lisinopril (PRINIVIL,ZESTRIL) 2.5 MG tablet Take 1 tablet (2.5 mg total) by mouth daily. 05/08/18   McDonald, Mia A, PA-C  metFORMIN (GLUCOPHAGE) 500 MG tablet Take 1 tablet (500 mg total) by mouth 2 (two) times daily with a meal. 08/05/18   Diallo, Abdoulaye, MD  methocarbamol (ROBAXIN) 500 MG tablet  Take 1 tablet (500 mg total) by mouth 2 (two) times daily for 7 days. 08/27/18 09/03/18  Claude Manges, PA-C  Semaglutide,0.25 or 0.5MG /DOS, 2 MG/1.5ML SOPN Inject 0.25 mg into the skin once a week. 08/19/18   Diallo, Lilia Argue, MD  sertraline (ZOLOFT) 50 MG tablet TAKE 1 TABLET(50 MG) BY MOUTH DAILY 05/16/18   Diallo, Lilia Argue, MD  sertraline (ZOLOFT) 50 MG tablet TAKE 1 TABLET(50 MG) BY MOUTH DAILY 08/17/18   Lovena Neighbours, MD    Family History Family History  Problem Relation Age of Onset  . COPD Father   . Hypertension Father   . Heart failure Father   . Diabetes Father   . Diabetes Mother   . Hyperlipidemia Mother   . Sleep apnea Brother   . Hypertension Brother     Social  History Social History   Tobacco Use  . Smoking status: Former Smoker    Packs/day: 0.30    Years: 10.00    Pack years: 3.00    Types: Cigarettes    Last attempt to quit: 05/28/2013    Years since quitting: 5.2  . Smokeless tobacco: Never Used  Substance Use Topics  . Alcohol use: No  . Drug use: No     Allergies   Slo-bid gyrocaps [theophylline]   Review of Systems Review of Systems  Constitutional: Negative for fever.  HENT: Negative for sore throat.   Respiratory: Negative for chest tightness.   Cardiovascular: Negative for chest pain.  Gastrointestinal: Negative for abdominal pain, nausea and vomiting.  Genitourinary: Negative for dysuria and flank pain.  Musculoskeletal: Positive for back pain (right upper back) and myalgias.  Skin: Negative for pallor and wound.  Neurological: Negative for light-headedness and headaches.     Physical Exam Updated Vital Signs BP (!) 143/71 (BP Location: Right Arm)   Pulse 82   Temp 99 F (37.2 C) (Oral)   Resp 18   Ht 5\' 9"  (1.753 m)   Wt (!) 139.7 kg   SpO2 97%   BMI 45.48 kg/m   Physical Exam  Constitutional: He is oriented to person, place, and time. He appears well-developed and well-nourished.  HENT:  Head: Normocephalic and atraumatic.  Mouth/Throat: Oropharynx is clear and moist.  Eyes: Pupils are equal, round, and reactive to light. No scleral icterus.  Neck: Normal range of motion.  Cardiovascular: Normal heart sounds.  Pulmonary/Chest: Effort normal and breath sounds normal. He has no wheezes. He exhibits no tenderness.  Abdominal: Soft. Bowel sounds are normal. He exhibits no distension. There is no tenderness.  Musculoskeletal: He exhibits no tenderness or deformity.       Right shoulder: He exhibits normal range of motion, no tenderness, no bony tenderness, no swelling, no effusion, no crepitus, no laceration, no pain, no spasm and normal pulse.       Thoracic back: Normal.       Arms: Tenderness to  palpation along the upper right side of his back.  Full range of motion of the right shoulder, pulses present, capillary refill is intact.  No midline tenderness.  Neurological: He is alert and oriented to person, place, and time.  Skin: Skin is warm and dry.  Nursing note and vitals reviewed.    ED Treatments / Results  Labs (all labs ordered are listed, but only abnormal results are displayed) Labs Reviewed - No data to display  EKG None  Radiology No results found.  Procedures Procedures (including critical care time)  Medications Ordered in ED Medications -  No data to display   Initial Impression / Assessment and Plan / ED Course  I have reviewed the triage vital signs and the nursing notes.  Pertinent labs & imaging results that were available during my care of the patient were reviewed by me and considered in my medical decision making (see chart for details).    Presents with right upper back pain which began a week ago after carrying a heavy item.  He reports the pain improved but now has worsened after carrying some groceries upstairs on Saturday.  Patient reports muscle strains are recurrent for him and he usually gets treated with Robaxin and ibuprofen.  He denies any trauma, tingling, weakness.  Has tenderness to palpation on the right upper back, full range of motion of right shoulder, no fevers.  Low suspicion for any septic joint.  Vitals are stable during ED visit.  Will treat patient with Robaxin and ibuprofen for his pain.  Return precautions provided.  Final Clinical Impressions(s) / ED Diagnoses   Final diagnoses:  Muscle strain    ED Discharge Orders         Ordered    methocarbamol (ROBAXIN) 500 MG tablet  2 times daily     08/27/18 1951    ibuprofen (ADVIL,MOTRIN) 800 MG tablet  3 times daily     08/27/18 1951           Claude Manges, Cordelia Poche 08/27/18 1953    Mesner, Barbara Cower, MD 08/27/18 2149

## 2018-08-27 NOTE — ED Triage Notes (Signed)
Pt c/o right upper back pain x 3 days-started after carrying heavy object at home-pain worse when lying down-NAD-steady gait

## 2018-09-02 ENCOUNTER — Other Ambulatory Visit: Payer: Self-pay | Admitting: Family Medicine

## 2018-09-02 DIAGNOSIS — F329 Major depressive disorder, single episode, unspecified: Secondary | ICD-10-CM

## 2018-09-02 DIAGNOSIS — F32A Depression, unspecified: Secondary | ICD-10-CM

## 2018-09-22 ENCOUNTER — Encounter: Payer: Self-pay | Admitting: Family Medicine

## 2018-09-26 ENCOUNTER — Other Ambulatory Visit: Payer: Self-pay | Admitting: Family Medicine

## 2018-09-26 DIAGNOSIS — E1165 Type 2 diabetes mellitus with hyperglycemia: Secondary | ICD-10-CM

## 2018-10-10 ENCOUNTER — Other Ambulatory Visit: Payer: Self-pay | Admitting: Family Medicine

## 2018-10-11 ENCOUNTER — Other Ambulatory Visit: Payer: Self-pay | Admitting: Family Medicine

## 2018-10-11 DIAGNOSIS — E1165 Type 2 diabetes mellitus with hyperglycemia: Secondary | ICD-10-CM

## 2018-10-15 ENCOUNTER — Encounter: Payer: Self-pay | Admitting: Family Medicine

## 2018-10-17 ENCOUNTER — Other Ambulatory Visit: Payer: Self-pay | Admitting: Family Medicine

## 2018-10-17 DIAGNOSIS — F329 Major depressive disorder, single episode, unspecified: Secondary | ICD-10-CM

## 2018-10-17 DIAGNOSIS — F32A Depression, unspecified: Secondary | ICD-10-CM

## 2018-10-25 ENCOUNTER — Other Ambulatory Visit: Payer: Self-pay | Admitting: Family Medicine

## 2018-10-25 DIAGNOSIS — E1165 Type 2 diabetes mellitus with hyperglycemia: Secondary | ICD-10-CM

## 2018-11-16 ENCOUNTER — Other Ambulatory Visit: Payer: Self-pay

## 2018-11-16 MED ORDER — METFORMIN HCL 500 MG PO TABS: ORAL_TABLET | ORAL | 3 refills | Status: DC

## 2018-11-23 ENCOUNTER — Other Ambulatory Visit: Payer: Self-pay

## 2018-11-23 DIAGNOSIS — F329 Major depressive disorder, single episode, unspecified: Secondary | ICD-10-CM

## 2018-11-23 DIAGNOSIS — F32A Depression, unspecified: Secondary | ICD-10-CM

## 2018-11-23 MED ORDER — SERTRALINE HCL 50 MG PO TABS: 50.0000 mg | ORAL_TABLET | Freq: Every day | ORAL | 5 refills | Status: DC

## 2018-12-11 ENCOUNTER — Other Ambulatory Visit: Payer: Self-pay | Admitting: Family Medicine

## 2018-12-14 ENCOUNTER — Other Ambulatory Visit: Payer: Self-pay | Admitting: Family Medicine

## 2018-12-14 ENCOUNTER — Telehealth: Payer: Self-pay | Admitting: *Deleted

## 2018-12-14 MED ORDER — FLUCONAZOLE 150 MG PO TABS: 150.0000 mg | ORAL_TABLET | Freq: Once | ORAL | 0 refills | Status: AC

## 2018-12-14 NOTE — Telephone Encounter (Signed)
Received fax from Pharmacy requesting refill of fluconazole, not on med list.  Will forward to provider.  Jone Baseman, CMA

## 2018-12-14 NOTE — Telephone Encounter (Signed)
Fluconazole sent to pharmacy. Will not refill in the future without office visit. Please inform patient.  Thanks  Lovena Neighbours, MD [facility], PGY-3\

## 2018-12-14 NOTE — Telephone Encounter (Signed)
Pt informed.  Keianna Signer, CMA  

## 2018-12-24 ENCOUNTER — Encounter: Payer: Self-pay | Admitting: Family Medicine

## 2018-12-31 ENCOUNTER — Other Ambulatory Visit: Payer: Self-pay

## 2018-12-31 ENCOUNTER — Ambulatory Visit (INDEPENDENT_AMBULATORY_CARE_PROVIDER_SITE_OTHER): Payer: 59 | Admitting: Family Medicine

## 2018-12-31 VITALS — BP 140/72 | HR 91 | Temp 98.7°F | Ht 69.0 in | Wt 322.0 lb

## 2018-12-31 DIAGNOSIS — E1165 Type 2 diabetes mellitus with hyperglycemia: Secondary | ICD-10-CM

## 2018-12-31 DIAGNOSIS — K279 Peptic ulcer, site unspecified, unspecified as acute or chronic, without hemorrhage or perforation: Secondary | ICD-10-CM | POA: Diagnosis not present

## 2018-12-31 LAB — POCT GLYCOSYLATED HEMOGLOBIN (HGB A1C): HbA1c, POC (controlled diabetic range): 7.4 % — AB (ref 0.0–7.0)

## 2018-12-31 MED ORDER — OMEPRAZOLE 40 MG PO CPDR: 40.0000 mg | DELAYED_RELEASE_CAPSULE | Freq: Every day | ORAL | 0 refills | Status: DC

## 2018-12-31 NOTE — Progress Notes (Signed)
Established Patient - Clinic Visit  Subjective Patient ID: MRN [id number]  Date of birth: December 22, 1975 PCP: Lovena Neighbours, MD  Michael Foster is a 43 y.o. male with past medical history significant for obesity, diabetes, hypertension, who presents to clinic for the following: CC: Abdominal pain  HPI:  Patient reports that his pain is located in right upper quadrant and midepigastric area.  The quality of pain is burning, he feels like he is on fire.  The pain is comes and goes but can be very painful.  The pain started around last week.  He reports that especially it hurts after eating.  Though, yesterday, he reports having size and it actually made his stomach feel better.  When the pain first started, patient was taking Alka-Seltzer tablets with aspirin.  Then he read somewhere that he should not be taking aspirin in setting of abdominal pain.  So then he changed to Tums and Mylanta which only slightly relieved the pain.  He is unable to eat spicy foods.  He reports NSAID use for back pain a couple of weeks ago, but stopped with Dr. Orene Desanctis recommendations. He denies any history of H. pylori.  No previous history of stomach ulcers. Patient denies any nausea vomiting, hematemesis, hematochezia, melena.  He denies current use of cigarettes.  He has not used alcohol recently.  He denies gastric reflux, chest pain.  He reports that he loves diet [facility] and that is all he drinks, but he has not been able to drink it due to the pain. He has previously had gallbladder issues, and reports that this is not the same pain as that.   ROS: See HPI Meds & Allergies: Reviewed with patient and updated in EMR as appropriate.  Social Hx:  Michael Foster reports that he quit smoking about 5 years ago. His smoking use included cigarettes. He has a 3.00 pack-year smoking history. He has never used smokeless tobacco. He reports that he does not drink alcohol or use drugs.  Objective Physical Exam:  BP 140/72    Pulse 91   Temp 98.7 F (37.1 C) (Oral)   Ht 5\' 9"  (1.753 m)   Wt (!) 322 lb (146.1 kg)   SpO2 96%   BMI 47.55 kg/m  Gen: NAD, alert, non-toxic, sitting comfortably  Skin: Warm and dry. No obvious rashes, lesions, or trauma. Abd: +BS. NTND on palpation. No rebound or guarding.  Psych: Cooperative. Pleasant. Makes eye contact. Speech normal. Extremities: Moves extremities spontaneously. Extremities warm and well perfused.   Pertinent Labs & Imaging:  Reviewed in chart as appropriate  Assessment & Plan Peptic ulcer disease DDx: acute choledocolithiasis (less likely as patient has experience this before and reports different symptoms), gastritis, pancreatitis,  Given location, quality, postprandial pain, recent weight loss, patient symptoms are most consistent with peptic ulcer disease - gastric involvement, most likely initiated by NSAID use and poor diet.  Will not test patient for H. pylori today given NSAID use, ASA, .  Will start patient on 6-week trial of PPI, and monitor for improvement.  Should patient fail to improve, will send to GI for further H. pylori work-up.   6 weeks PPI, thru 02/11/19  F/u in 4-6 weeks  Pt counseled on NSAID use (limiting days and having with meals)   Continue ASA in this patient as he has many CV risk factors and hx of TIA, DM  Patient to return if tarry stool, severe abdominal pain, fevers, severe n/v   Diabetes (HCC) A1C  checked today with great improvement. Patient is very excited and looks forward to seeing Dr. Sydnee Cabaliallo with his success. He reports much improved compliance with mediations over the last few months. He also just bought a treadmill that came yesterday. He looks forward to using it, especially with COVID stay at home orders.   Genia Hotterachel , M.D. [facility]  PGY -1 01/02/2019, 12:23 PM

## 2018-12-31 NOTE — Patient Instructions (Signed)
Dear Michael Foster,   It was very nice to see you! Thank you for taking your time to come in to be seen. Today, we discussed the following:   Abdominal Pain    Likely due to prolonged NSAID use   Stop taking NSAIDs, DO continue taking your aspirin as you have high risk of cardiovascular disease   Take PPI (Omeprazole) for 6 weeks.   Please return if your symptoms do not resolve   Congratulations on your Hemoglobin A1C! Keep up the fantastic work with diet and exercise!!   Be well,   Dr. Genia Hotter [facility] [phone]   Sign up for MyChart for instant access to your health profile, labs, orders, upcoming appointments or to contact your provider with questions.

## 2019-01-01 ENCOUNTER — Encounter: Payer: Self-pay | Admitting: Family Medicine

## 2019-01-02 ENCOUNTER — Encounter: Payer: Self-pay | Admitting: Family Medicine

## 2019-01-02 DIAGNOSIS — K279 Peptic ulcer, site unspecified, unspecified as acute or chronic, without hemorrhage or perforation: Secondary | ICD-10-CM | POA: Insufficient documentation

## 2019-01-02 NOTE — Assessment & Plan Note (Addendum)
DDx: acute choledocolithiasis (less likely as patient has experience this before and reports different symptoms), gastritis, pancreatitis,  Given location, quality, postprandial pain, recent weight loss, patient symptoms are most consistent with peptic ulcer disease - gastric involvement, most likely initiated by NSAID use and poor diet.  Will not test patient for H. pylori today given NSAID use, ASA, .  Will start patient on 6-week trial of PPI, and monitor for improvement.  Should patient fail to improve, will send to GI for further H. pylori work-up.   6 weeks PPI, thru 02/11/19  F/u in 4-6 weeks  Pt counseled on NSAID use (limiting days and having with meals)   Continue ASA in this patient as he has many CV risk factors and hx of TIA, DM  Patient to return if tarry stool, severe abdominal pain, fevers, severe n/v

## 2019-01-02 NOTE — Assessment & Plan Note (Addendum)
A1C checked today with great improvement. Patient is very excited and looks forward to seeing Dr. Sydnee Cabal with his success. He reports much improved compliance with mediations over the last few months. He also just bought a treadmill that came yesterday. He looks forward to using it, especially with COVID stay at home orders.

## 2019-01-29 ENCOUNTER — Other Ambulatory Visit: Payer: Self-pay

## 2019-01-29 ENCOUNTER — Telehealth (INDEPENDENT_AMBULATORY_CARE_PROVIDER_SITE_OTHER): Payer: 59 | Admitting: Family Medicine

## 2019-01-29 DIAGNOSIS — E1165 Type 2 diabetes mellitus with hyperglycemia: Secondary | ICD-10-CM | POA: Diagnosis not present

## 2019-01-29 MED ORDER — ALBUTEROL SULFATE HFA 108 (90 BASE) MCG/ACT IN AERS: INHALATION_SPRAY | RESPIRATORY_TRACT | 1 refills | Status: DC

## 2019-01-29 NOTE — Progress Notes (Signed)
[facility] Telemedicine Visit  Patient consented to have virtual visit. Method of visit: Video  Encounter participants: Patient: Michael Foster - located at Home Provider: Lovena Neighbours - located at [facility] Others (if applicable): None   Chief Complaint: Follow up on T2DM management   HPI: Patient is a 43 yo male who is following up on recent telemedicine visit for diabetes. Patient had significant improvement in his A1c going from 11.9 to 7.4. He is currently on Tresiba 30 units but was informed that insurance will only cover Toujeo or Lantus starting July 1st. Patient has been working from home and has been taken out of his workout routine. He just bought a treadmill but has not start using. He reports fasting BG have been ranging between 160-200. He has been careful with his diet. He denies any polyuria or polidipsia, nausea or vomiting.  ROS: per HPI  Pertinent PMHx: T2DM  Exam: Cardiac: No palpations Respiratory: no increase WOB Abdominal: no abdominal pain  Neuro: No focal deficits, AOx4   Assessment/Plan:  Diabetes (HCC) Recent A1c 7.4 from 11.9 back in November, FBG still above goal. Will increase his Tresiba to 35 units from 30 units daily and continue ozempic at current dose. Patient will continue to work on increasing his exercise regimen and improving his diet. He will follow up next month in clinic.    Time spent during visit with patient:  10 minutes

## 2019-01-29 NOTE — Assessment & Plan Note (Addendum)
Recent A1c 7.4 from 11.9 back in November, FBG still above goal. Will increase his Tresiba to 35 units from 30 units daily and continue ozempic at current dose. Patient will continue to work on increasing his exercise regimen and improving his diet. He will follow up next month in clinic.

## 2019-02-11 ENCOUNTER — Other Ambulatory Visit: Payer: Self-pay

## 2019-02-11 MED ORDER — METFORMIN HCL 500 MG PO TABS: ORAL_TABLET | ORAL | 3 refills | Status: DC

## 2019-03-03 ENCOUNTER — Other Ambulatory Visit: Payer: Self-pay

## 2019-03-04 MED ORDER — BD PEN NEEDLE SHORT U/F 31G X 8 MM MISC: 2 refills | Status: DC

## 2019-04-05 ENCOUNTER — Other Ambulatory Visit: Payer: Self-pay | Admitting: *Deleted

## 2019-04-05 DIAGNOSIS — F32A Depression, unspecified: Secondary | ICD-10-CM

## 2019-04-05 DIAGNOSIS — F329 Major depressive disorder, single episode, unspecified: Secondary | ICD-10-CM

## 2019-04-05 MED ORDER — SERTRALINE HCL 50 MG PO TABS: 50.0000 mg | ORAL_TABLET | Freq: Every day | ORAL | 5 refills | Status: DC

## 2019-04-13 ENCOUNTER — Other Ambulatory Visit: Payer: Self-pay | Admitting: *Deleted

## 2019-04-13 DIAGNOSIS — I10 Essential (primary) hypertension: Secondary | ICD-10-CM

## 2019-04-14 NOTE — Telephone Encounter (Signed)
Called twice. No answer. VM not set up.  Matilde Haymaker, MD

## 2019-04-15 NOTE — Telephone Encounter (Signed)
Two attempts to call today without answer.  I would like to discuss this medication before refilling it. Please update contact info if pt calls back.

## 2019-05-03 ENCOUNTER — Other Ambulatory Visit: Payer: Self-pay | Admitting: Family Medicine

## 2019-05-03 ENCOUNTER — Encounter: Payer: Self-pay | Admitting: Family Medicine

## 2019-05-03 ENCOUNTER — Other Ambulatory Visit: Payer: Self-pay

## 2019-05-03 DIAGNOSIS — Z20822 Contact with and (suspected) exposure to covid-19: Secondary | ICD-10-CM

## 2019-05-03 DIAGNOSIS — E1165 Type 2 diabetes mellitus with hyperglycemia: Secondary | ICD-10-CM

## 2019-05-04 LAB — NOVEL CORONAVIRUS, NAA: SARS-CoV-2, NAA: NOT DETECTED

## 2019-05-05 NOTE — Telephone Encounter (Signed)
mychart message sent to patient regarding his need for an appointment.  Jahki Witham,CMA

## 2019-05-05 NOTE — Telephone Encounter (Signed)
Please call pt and ask him to schedule an appointment with me in the next month or so to follow up regarding his diabetes management.  Michael Haymaker, MD

## 2019-05-06 ENCOUNTER — Encounter: Payer: Self-pay | Admitting: Family Medicine

## 2019-05-06 DIAGNOSIS — J4520 Mild intermittent asthma, uncomplicated: Secondary | ICD-10-CM

## 2019-05-11 ENCOUNTER — Other Ambulatory Visit: Payer: Self-pay | Admitting: Family Medicine

## 2019-05-11 DIAGNOSIS — E1165 Type 2 diabetes mellitus with hyperglycemia: Secondary | ICD-10-CM

## 2019-05-11 MED ORDER — LANTUS SOLOSTAR 100 UNIT/ML ~~LOC~~ SOPN: 24.0000 [IU] | PEN_INJECTOR | Freq: Every day | SUBCUTANEOUS | 99 refills | Status: DC

## 2019-05-13 ENCOUNTER — Other Ambulatory Visit: Payer: Self-pay

## 2019-05-13 ENCOUNTER — Ambulatory Visit (INDEPENDENT_AMBULATORY_CARE_PROVIDER_SITE_OTHER): Payer: 59 | Admitting: Student in an Organized Health Care Education/Training Program

## 2019-05-13 ENCOUNTER — Encounter: Payer: Self-pay | Admitting: Family Medicine

## 2019-05-13 ENCOUNTER — Encounter: Payer: Self-pay | Admitting: Student in an Organized Health Care Education/Training Program

## 2019-05-13 VITALS — BP 146/82 | HR 91

## 2019-05-13 DIAGNOSIS — Z20822 Contact with and (suspected) exposure to covid-19: Secondary | ICD-10-CM

## 2019-05-13 DIAGNOSIS — E1165 Type 2 diabetes mellitus with hyperglycemia: Secondary | ICD-10-CM | POA: Diagnosis not present

## 2019-05-13 DIAGNOSIS — Z20828 Contact with and (suspected) exposure to other viral communicable diseases: Secondary | ICD-10-CM | POA: Diagnosis not present

## 2019-05-13 DIAGNOSIS — S335XXA Sprain of ligaments of lumbar spine, initial encounter: Secondary | ICD-10-CM | POA: Diagnosis not present

## 2019-05-13 LAB — POCT GLYCOSYLATED HEMOGLOBIN (HGB A1C): HbA1c, POC (controlled diabetic range): 11.4 % — AB (ref 0.0–7.0)

## 2019-05-13 MED ORDER — LANTUS SOLOSTAR 100 UNIT/ML ~~LOC~~ SOPN: PEN_INJECTOR | SUBCUTANEOUS | 3 refills | Status: DC

## 2019-05-13 MED ORDER — METHOCARBAMOL 500 MG PO TABS: 500.0000 mg | ORAL_TABLET | Freq: Two times a day (BID) | ORAL | 0 refills | Status: DC | PRN

## 2019-05-13 NOTE — Patient Instructions (Signed)
It was a pleasure to see you today!  To summarize our discussion for this visit:  I am refilling your insulin and have changed the dose to be given in the morning. Please follow up with your PCP to make changes to this regimen as needed. I also recommend you increase your metformin dose.   You can go to retest for COVID.   I have refilled your muscle relaxer which is for a short term   Call the clinic at [phone] if your symptoms worsen or you have any concerns.   Thank you for allowing me to take part in your care,  Dr. Jamelle Rushinghelsey Ellon Marasco   Low Back Sprain or Strain Rehab Ask your health care provider which exercises are safe for you. Do exercises exactly as told by your health care provider and adjust them as directed. It is normal to feel mild stretching, pulling, tightness, or discomfort as you do these exercises. Stop right away if you feel sudden pain or your pain gets worse. Do not begin these exercises until told by your health care provider. Stretching and range-of-motion exercises These exercises warm up your muscles and joints and improve the movement and flexibility of your back. These exercises also help to relieve pain, numbness, and tingling. Lumbar rotation  1. Lie on your back on a firm surface and bend your knees. 2. Straighten your arms out to your sides so each arm forms a 90-degree angle (right angle) with a side of your body. 3. Slowly move (rotate) both of your knees to one side of your body until you feel a stretch in your lower back (lumbar). Try not to let your shoulders lift off the floor. 4. Hold this position for __________ seconds. 5. Tense your abdominal muscles and slowly move your knees back to the starting position. 6. Repeat this exercise on the other side of your body. Repeat __________ times. Complete this exercise __________ times a day. Single knee to chest  1. Lie on your back on a firm surface with both legs straight. 2. Bend one of your  knees. Use your hands to move your knee up toward your chest until you feel a gentle stretch in your lower back and buttock. ? Hold your leg in this position by holding on to the front of your knee. ? Keep your other leg as straight as possible. 3. Hold this position for __________ seconds. 4. Slowly return to the starting position. 5. Repeat with your other leg. Repeat __________ times. Complete this exercise __________ times a day. Prone extension on elbows  1. Lie on your abdomen on a firm surface (prone position). 2. Prop yourself up on your elbows. 3. Use your arms to help lift your chest up until you feel a gentle stretch in your abdomen and your lower back. ? This will place some of your body weight on your elbows. If this is uncomfortable, try stacking pillows under your chest. ? Your hips should stay down, against the surface that you are lying on. Keep your hip and back muscles relaxed. 4. Hold this position for __________ seconds. 5. Slowly relax your upper body and return to the starting position. Repeat __________ times. Complete this exercise __________ times a day. Strengthening exercises These exercises build strength and endurance in your back. Endurance is the ability to use your muscles for a long time, even after they get tired. Pelvic tilt This exercise strengthens the muscles that lie deep in the abdomen. 1. Lie on your back  on a firm surface. Bend your knees and keep your feet flat on the floor. 2. Tense your abdominal muscles. Tip your pelvis up toward the ceiling and flatten your lower back into the floor. ? To help with this exercise, you may place a small towel under your lower back and try to push your back into the towel. 3. Hold this position for __________ seconds. 4. Let your muscles relax completely before you repeat this exercise. Repeat __________ times. Complete this exercise __________ times a day. Alternating arm and leg raises  1. Get on your hands  and knees on a firm surface. If you are on a hard floor, you may want to use padding, such as an exercise mat, to cushion your knees. 2. Line up your arms and legs. Your hands should be directly below your shoulders, and your knees should be directly below your hips. 3. Lift your left leg behind you. At the same time, raise your right arm and straighten it in front of you. ? Do not lift your leg higher than your hip. ? Do not lift your arm higher than your shoulder. ? Keep your abdominal and back muscles tight. ? Keep your hips facing the ground. ? Do not arch your back. ? Keep your balance carefully, and do not hold your breath. 4. Hold this position for __________ seconds. 5. Slowly return to the starting position. 6. Repeat with your right leg and your left arm. Repeat __________ times. Complete this exercise __________ times a day. Abdominal set with straight leg raise  1. Lie on your back on a firm surface. 2. Bend one of your knees and keep your other leg straight. 3. Tense your abdominal muscles and lift your straight leg up, 4-6 inches (10-15 cm) off the ground. 4. Keep your abdominal muscles tight and hold this position for __________ seconds. ? Do not hold your breath. ? Do not arch your back. Keep it flat against the ground. 5. Keep your abdominal muscles tense as you slowly lower your leg back to the starting position. 6. Repeat with your other leg. Repeat __________ times. Complete this exercise __________ times a day. Single leg lower with bent knees 1. Lie on your back on a firm surface. 2. Tense your abdominal muscles and lift your feet off the floor, one foot at a time, so your knees and hips are bent in 90-degree angles (right angles). ? Your knees should be over your hips and your lower legs should be parallel to the floor. 3. Keeping your abdominal muscles tense and your knee bent, slowly lower one of your legs so your toe touches the ground. 4. Lift your leg back up  to return to the starting position. ? Do not hold your breath. ? Do not let your back arch. Keep your back flat against the ground. 5. Repeat with your other leg. Repeat __________ times. Complete this exercise __________ times a day. Posture and body mechanics Good posture and healthy body mechanics can help to relieve stress in your body's tissues and joints. Body mechanics refers to the movements and positions of your body while you do your daily activities. Posture is part of body mechanics. Good posture means:  Your spine is in its natural S-curve position (neutral).  Your shoulders are pulled back slightly.  Your head is not tipped forward. Follow these guidelines to improve your posture and body mechanics in your everyday activities. Standing   When standing, keep your spine neutral and your feet  about hip width apart. Keep a slight bend in your knees. Your ears, shoulders, and hips should line up.  When you do a task in which you stand in one place for a long time, place one foot up on a stable object that is 2-4 inches (5-10 cm) high, such as a footstool. This helps keep your spine neutral. Sitting   When sitting, keep your spine neutral and keep your feet flat on the floor. Use a footrest, if necessary, and keep your thighs parallel to the floor. Avoid rounding your shoulders, and avoid tilting your head forward.  When working at a desk or a computer, keep your desk at a height where your hands are slightly lower than your elbows. Slide your chair under your desk so you are close enough to maintain good posture.  When working at a computer, place your monitor at a height where you are looking straight ahead and you do not have to tilt your head forward or downward to look at the screen. Resting  When lying down and resting, avoid positions that are most painful for you.  If you have pain with activities such as sitting, bending, stooping, or squatting, lie in a position in  which your body does not bend very much. For example, avoid curling up on your side with your arms and knees near your chest (fetal position).  If you have pain with activities such as standing for a long time or reaching with your arms, lie with your spine in a neutral position and bend your knees slightly. Try the following positions: ? Lying on your side with a pillow between your knees. ? Lying on your back with a pillow under your knees. Lifting   When lifting objects, keep your feet at least shoulder width apart and tighten your abdominal muscles.  Bend your knees and hips and keep your spine neutral. It is important to lift using the strength of your legs, not your back. Do not lock your knees straight out.  Always ask for help to lift heavy or awkward objects. This information is not intended to replace advice given to you by your health care provider. Make sure you discuss any questions you have with your health care provider. Document Released: 09/09/2005 Document Revised: 01/01/2019 Document Reviewed: 10/01/2018 Elsevier Patient Education  2020 [employer].

## 2019-05-13 NOTE — Assessment & Plan Note (Signed)
Asymptomatic Exposure greater than 2 weeks ago Provided patient with instructions for testing

## 2019-05-13 NOTE — Assessment & Plan Note (Signed)
A1c today greatly increased at 11.4. Patient with out of insulin for 4 days. Refilled patient's insulin today. Recommended the following changes -Take insulin in the morning instead of bedtime and increase to 25 units. -Increase metformin to 1000 twice daily. -Follow-up with PCP ASAP for further management

## 2019-05-13 NOTE — Assessment & Plan Note (Signed)
Patient has lower back sprain less than 24 hours. Requests muscle relaxant which is worked in the past. Declined referral for physical therapy at this time. Recommended he return in about 2 weeks for referral to physical therapy if not improved. Provided patient with back strain rehab information to stretch and strengthen at home. Refilled Robaxin.

## 2019-05-13 NOTE — Progress Notes (Signed)
   Subjective:    Patient ID: Michael Foster, male    DOB: [birth year], 43 y.o.   MRN: [id number]   CC: Back pain  HPI:  Back pain-patient states that he injured his back last night while he was rolling over in bed and tried to not push his cat so it tweaked his lower back.  He has had a sharp pain in right lumbar area since that time.  It is exacerbated by getting up from a standing position or laying down.  Patient has tried ibuprofen in combination with a muscle relaxant he had leftover from the last time he tweaked his back and that helped.  He is not interested in physical therapy at this time he would just like another muscle relaxer.  He denies any redness or increased warmth or swelling to the area.  Denies any weakness or numbness to his legs.  Denies any incontinence of bowel or bladder.  He has no difficulties with ambulation.  The pain does not radiate.  Diabetes-patient states he has been out of his insulin for 4 days but was otherwise compliant.  When told that his A1c went from 7.4 4 months ago to11.4 today he was not surprised.  Denies polyuria, polydipsia, weight change.  COVID exposure-patient states that his father-in-law was diagnosed positive with COVID.  He was in the presence of his father-in-law approximately 2 weeks ago and has been asymptomatic since.  Denies shortness of breath chest pain changes in taste or diarrhea.  He would like to get tested again and have a previous negative test.  He has been isolating at home since the exposure.   Smoking status reviewed   ROS: pertinent noted in the HPI   Past medical history, surgical, family, and social history reviewed and updated in the EMR as appropriate.  Objective:  BP (!) 146/82   Pulse 91   SpO2 96%   Vitals and nursing note reviewed  General: NAD, pleasant, able to participate in exam Extremities: no edema or cyanosis. Skin: warm and dry, no rashes noted MSK:/Spine negative to tenderness to palpation.  Patient  able to ambulate without limp.  Full range of motion in flexion and extension described as painful with flexion.  Negative for erythema, warmth, edema, rash, mass. Neuro: alert, no obvious focal deficits Psych: Normal affect and mood   Assessment & Plan:    Diabetes (HCC) A1c today greatly increased at 11.4. Patient with out of insulin for 4 days. Refilled patient's insulin today. Recommended the following changes -Take insulin in the morning instead of bedtime and increase to 25 units. -Increase metformin to 1000 twice daily. -Follow-up with PCP ASAP for further management  Low back sprain Patient has lower back sprain less than 24 hours. Requests muscle relaxant which is worked in the past. Declined referral for physical therapy at this time. Recommended he return in about 2 weeks for referral to physical therapy if not improved. Provided patient with back strain rehab information to stretch and strengthen at home. Refilled Robaxin.  Exposure to Covid-19 Virus Asymptomatic Exposure greater than 2 weeks ago Provided patient with instructions for testing    Doristine Mango, [facility] PGY-2

## 2019-05-15 LAB — NOVEL CORONAVIRUS, NAA: SARS-CoV-2, NAA: NOT DETECTED

## 2019-05-19 ENCOUNTER — Other Ambulatory Visit: Payer: Self-pay

## 2019-05-19 ENCOUNTER — Encounter (HOSPITAL_BASED_OUTPATIENT_CLINIC_OR_DEPARTMENT_OTHER): Payer: Self-pay | Admitting: Emergency Medicine

## 2019-05-19 ENCOUNTER — Emergency Department (HOSPITAL_BASED_OUTPATIENT_CLINIC_OR_DEPARTMENT_OTHER): Payer: 59

## 2019-05-19 ENCOUNTER — Emergency Department (HOSPITAL_BASED_OUTPATIENT_CLINIC_OR_DEPARTMENT_OTHER)
Admission: EM | Admit: 2019-05-19 | Discharge: 2019-05-19 | Disposition: A | Discharge status: Home / Self Care | Payer: 59 | Attending: Emergency Medicine | Admitting: Emergency Medicine

## 2019-05-19 ENCOUNTER — Other Ambulatory Visit: Payer: Self-pay | Admitting: *Deleted

## 2019-05-19 DIAGNOSIS — Y9301 Activity, walking, marching and hiking: Secondary | ICD-10-CM | POA: Diagnosis not present

## 2019-05-19 DIAGNOSIS — Z87891 Personal history of nicotine dependence: Secondary | ICD-10-CM | POA: Insufficient documentation

## 2019-05-19 DIAGNOSIS — Z79899 Other long term (current) drug therapy: Secondary | ICD-10-CM | POA: Insufficient documentation

## 2019-05-19 DIAGNOSIS — I10 Essential (primary) hypertension: Secondary | ICD-10-CM | POA: Diagnosis not present

## 2019-05-19 DIAGNOSIS — W25XXXA Contact with sharp glass, initial encounter: Secondary | ICD-10-CM | POA: Diagnosis not present

## 2019-05-19 DIAGNOSIS — E119 Type 2 diabetes mellitus without complications: Secondary | ICD-10-CM | POA: Insufficient documentation

## 2019-05-19 DIAGNOSIS — S90852A Superficial foreign body, left foot, initial encounter: Secondary | ICD-10-CM | POA: Diagnosis present

## 2019-05-19 DIAGNOSIS — Y9289 Other specified places as the place of occurrence of the external cause: Secondary | ICD-10-CM | POA: Insufficient documentation

## 2019-05-19 DIAGNOSIS — Y998 Other external cause status: Secondary | ICD-10-CM | POA: Diagnosis not present

## 2019-05-19 DIAGNOSIS — E785 Hyperlipidemia, unspecified: Secondary | ICD-10-CM | POA: Insufficient documentation

## 2019-05-19 MED ORDER — CEPHALEXIN 250 MG PO CAPS
500.0000 mg | ORAL_CAPSULE | Freq: Once | ORAL | Status: AC
  Administered 2019-05-19: 500 mg via ORAL
  Filled 2019-05-19: qty 2

## 2019-05-19 MED ORDER — NAPROXEN 500 MG PO TABS: 500.0000 mg | ORAL_TABLET | Freq: Two times a day (BID) | ORAL | 0 refills | Status: DC

## 2019-05-19 MED ORDER — TETANUS-DIPHTH-ACELL PERTUSSIS 5-2.5-18.5 LF-MCG/0.5 IM SUSP: 0.5000 mL | Freq: Once | INTRAMUSCULAR | Status: DC

## 2019-05-19 MED ORDER — LIDOCAINE-EPINEPHRINE (PF) 2 %-1:200000 IJ SOLN
INTRAMUSCULAR | Status: AC
  Administered 2019-05-19: 10 mL
  Filled 2019-05-19: qty 10

## 2019-05-19 MED ORDER — ALBUTEROL SULFATE HFA 108 (90 BASE) MCG/ACT IN AERS: INHALATION_SPRAY | RESPIRATORY_TRACT | 1 refills | Status: DC

## 2019-05-19 MED ORDER — IBUPROFEN 800 MG PO TABS
800.0000 mg | ORAL_TABLET | Freq: Once | ORAL | Status: AC
  Administered 2019-05-19: 800 mg via ORAL
  Filled 2019-05-19: qty 1

## 2019-05-19 MED ORDER — CEPHALEXIN 500 MG PO CAPS: 500.0000 mg | ORAL_CAPSULE | Freq: Four times a day (QID) | ORAL | 0 refills | Status: DC

## 2019-05-19 MED ORDER — DOXYCYCLINE HYCLATE 100 MG PO TABS
100.0000 mg | ORAL_TABLET | Freq: Once | ORAL | Status: AC
  Administered 2019-05-19: 100 mg via ORAL
  Filled 2019-05-19: qty 1

## 2019-05-19 MED ORDER — DOXYCYCLINE HYCLATE 100 MG PO CAPS: 100.0000 mg | ORAL_CAPSULE | Freq: Two times a day (BID) | ORAL | 0 refills | Status: DC

## 2019-05-19 MED ORDER — ACETAMINOPHEN 500 MG PO TABS
1000.0000 mg | ORAL_TABLET | Freq: Once | ORAL | Status: AC
  Administered 2019-05-19: 1000 mg via ORAL
  Filled 2019-05-19: qty 2

## 2019-05-19 MED ORDER — FLOVENT HFA 44 MCG/ACT IN AERO: 2.0000 | INHALATION_SPRAY | Freq: Every day | RESPIRATORY_TRACT | 12 refills | Status: DC

## 2019-05-19 NOTE — ED Triage Notes (Signed)
Glass in bottom of left foot from 1400 yesterday. Glass from oil lamp that broke in his house. Pt states  He under quarentine because of Covid exposure 2 weeks ago. No Covid sx.

## 2019-05-19 NOTE — ED Notes (Signed)
Soaking Left foot in warm soapy water.

## 2019-05-19 NOTE — Telephone Encounter (Signed)
Mr. Craighead was contacted via phone.  He reports that he has been taking his albuterol 2 puffs every morning and 2 puffs every nights because of symptoms.  He has previously taken a controller medication but is not on one at present.  He also reported that he was recently seen in the ED for piece of glass he has stuck in his foot.  In the ED, they were able to remove 1 large piece of glass although x-ray demonstrated there is still 1 piece of glass stuck in his foot.  He was sent home on antibiotics and instructed to soak his foot daily to encourage the extrusion of the remaining piece of glass.  He is currently under quarantine as he had recent exposure to COVID positive person.  His last COVID test on 8/20 was negative.  He was told to quarantine until 9/6.  Plan Asthma, poorly controlled -Start Flovent 2 puffs daily, sent to pharmacy -Refilled albuterol  Foot trauma -Continue antibiotics and foot soaks -Schedule visit after September 6 for assessment of foot in clinic.  Consider earlier assessment in clinic (1 week from ED visit) if COVID test on 8/27 returns negative.

## 2019-05-19 NOTE — ED Provider Notes (Addendum)
[facility] EMERGENCY DEPARTMENT Provider Note   CSN: [id number] Arrival date & time: 05/19/19  0236     History   Chief Complaint Chief Complaint  Patient presents with   Foot Injury    HPI Michael Foster is a 43 y.o. male.     The history is provided by the patient.  Foot Injury Location:  Foot Time since incident:  13 hours Injury: yes   Mechanism of injury comment:  Glass in the foot from a broken oil lamp Foot location:  L foot Chronicity:  New Dislocation: no   Foreign body present:  Glass Tetanus status:  Up to date Prior injury to area:  No Relieved by:  Nothing Worsened by:  Nothing Ineffective treatments:  None tried Associated symptoms: no back pain, no fatigue, no fever, no itching, no neck pain, no numbness, no stiffness, no swelling and no tingling   Risk factors: no concern for non-accidental trauma     Past Medical History:  Diagnosis Date   ADD (attention deficit disorder)    Asthma    as a child   Diabetes mellitus Dx 2009   Gastroenteritis    H/O TIA (transient ischemic attack) and stroke 01/04/2015   06/2014 with L hand numbness    Hypertension Dx 2009   Vertigo     Patient Active Problem List   Diagnosis Date Noted   Low back sprain 05/13/2019   Exposure to Covid-19 Virus 05/13/2019   Peptic ulcer disease 01/02/2019   Yeast infection 08/19/2018   Depression 03/11/2018   Dyspnea 07/06/2015   Screening for STD (sexually transmitted disease) 06/09/2015   H/O TIA (transient ischemic attack) and stroke 01/04/2015   Diabetes (HCC) 06/10/2013   Morbid obesity (HCC) 06/10/2013   Asthma, chronic 06/10/2013   Backache 03/22/2010   HYPERLIPIDEMIA 01/17/2010   Essential hypertension 01/17/2010   OSA on CPAP 01/17/2010    Past Surgical History:  Procedure Laterality Date   MOUTH SURGERY          Home Medications    Prior to Admission medications   Medication Sig Start Date End Date Taking?  Authorizing Provider  albuterol (VENTOLIN HFA) 108 (90 Base) MCG/ACT inhaler INHALE 2 PUFFS INTO THE LUNGS EVERY 4 HOURS AS NEEDED FOR WHEEZING OR SHORTNESS OF BREATH 01/29/19   Diallo, Lilia Argue, MD  BD PEN NEEDLE NANO U/F 32G X 4 MM MISC USE AS DIRECTED DAILY 10/26/18   Diallo, Lilia Argue, MD  cephALEXin (KEFLEX) 500 MG capsule Take 1 capsule (500 mg total) by mouth 4 (four) times daily. 05/19/19   Alexsandria Kivett, MD  doxycycline (VIBRAMYCIN) 100 MG capsule Take 1 capsule (100 mg total) by mouth 2 (two) times daily. One po bid x 7 days 05/19/19   Jewelene Mairena, MD  glucose blood (ACCU-CHEK GUIDE) test strip Use as instructed 05/08/18   McDonald, Mia A, PA-C  Insulin Glargine (LANTUS SOLOSTAR) 100 UNIT/ML Solostar Pen Inject under skin 25units every morning. 05/13/19   Anderson, Chelsey L, DO  Insulin Pen Needle (B-D ULTRAFINE III SHORT PEN) 31G X 8 MM MISC Use daily 03/04/19   Diallo, Abdoulaye, MD  Insulin Pen Needle 32G X 6 MM MISC 20 Units by Does not apply route daily. 05/08/18   McDonald, Mia A, PA-C  lisinopril (PRINIVIL,ZESTRIL) 2.5 MG tablet Take 1 tablet (2.5 mg total) by mouth daily. 05/08/18   McDonald, Mia A, PA-C  metFORMIN (GLUCOPHAGE) 500 MG tablet TAKE 1 TABLET(500 MG) BY MOUTH TWICE DAILY WITH A MEAL 02/11/19  Diallo, Abdoulaye, MD  methocarbamol (ROBAXIN) 500 MG tablet Take 1 tablet (500 mg total) by mouth 2 (two) times daily as needed for muscle spasms. Do not take when operating vehicle. May cause drowsiness 05/13/19   Anderson, Chelsey L, DO  naproxen (NAPROSYN) 500 MG tablet Take 1 tablet (500 mg total) by mouth 2 (two) times daily with a meal. 05/19/19   Adreyan Carbajal, MD  omeprazole (PRILOSEC) 40 MG capsule Take 1 capsule (40 mg total) by mouth daily. 12/31/18 02/11/19  Wilber Oliphant, MD  OZEMPIC, 0.25 OR 0.5 MG/DOSE, 2 MG/1.5ML SOPN INJECT 0.25 MG UNDER THE SKIN ONCE WEEKLY 05/05/19   Matilde Haymaker, MD  sertraline (ZOLOFT) 50 MG tablet Take 1 tablet (50 mg total) by mouth daily. 04/05/19    Matilde Haymaker, MD    Family History Family History  Problem Relation Age of Onset   COPD Father    Hypertension Father    Heart failure Father    Diabetes Father    Diabetes Mother    Hyperlipidemia Mother    Sleep apnea Brother    Hypertension Brother     Social History Social History   Tobacco Use   Smoking status: Former Smoker    Packs/day: 0.30    Years: 10.00    Pack years: 3.00    Types: Cigarettes    Quit date: 05/28/2013    Years since quitting: 5.9   Smokeless tobacco: Never Used  Substance Use Topics   Alcohol use: No   Drug use: No     Allergies   Slo-bid gyrocaps [theophylline]   Review of Systems Review of Systems  Constitutional: Negative for fatigue and fever.  HENT: Negative for congestion.   Eyes: Negative for visual disturbance.  Respiratory: Negative for cough and shortness of breath.   Cardiovascular: Negative for chest pain.  Gastrointestinal: Negative for abdominal pain.  Genitourinary: Negative for difficulty urinating.  Musculoskeletal: Negative for back pain, neck pain and stiffness.  Skin: Negative for itching.  Neurological: Negative for dizziness.  Psychiatric/Behavioral: Negative for agitation.  All other systems reviewed and are negative.    Physical Exam Updated Vital Signs BP (!) 157/75 (BP Location: Right Arm)    Pulse 86    Temp 98.2 F (36.8 C) (Oral)    Resp 16    Wt (!) 146.1 kg    BMI 47.56 kg/m   Physical Exam Constitutional:      General: He is not in acute distress.    Appearance: Normal appearance.  HENT:     Head: Normocephalic and atraumatic.     Nose: Nose normal.  Eyes:     Conjunctiva/sclera: Conjunctivae normal.     Pupils: Pupils are equal, round, and reactive to light.  Neck:     Musculoskeletal: Normal range of motion and neck supple.  Cardiovascular:     Rate and Rhythm: Normal rate and regular rhythm.     Pulses: Normal pulses.     Heart sounds: Normal heart sounds.  Pulmonary:       Effort: Pulmonary effort is normal.     Breath sounds: Normal breath sounds.  Abdominal:     General: Abdomen is flat. Bowel sounds are normal.  Musculoskeletal: Normal range of motion.  Skin:    General: Skin is warm and dry.     Capillary Refill: Capillary refill takes less than 2 seconds.  Neurological:     General: No focal deficit present.     Mental Status: He is alert and  oriented to person, place, and time.  Psychiatric:        Mood and Affect: Mood normal.        Behavior: Behavior normal.      ED Treatments / Results  Labs (all labs ordered are listed, but only abnormal results are displayed) Labs Reviewed - No data to display  EKG None  Radiology Dg Foot Complete Left  Result Date: 05/19/2019 CLINICAL DATA:  43 year old male with glass the bottom of the foot. EXAM: LEFT FOOT - COMPLETE 3+ VIEW COMPARISON:  Left foot radiograph dated 07/15/2011 FINDINGS: There is a 4 mm linear radiopaque foreign object in the superficial soft tissues of the plantar aspect of the hindfoot adjacent to the BB marker consistent with glass fragment. There is no acute fracture or dislocation. The bones are well mineralized. No arthritic changes. IMPRESSION: Small linear foreign fragment in the superficial soft tissues of the plantar hindfoot. Electronically Signed   By: Elgie CollardArash  Radparvar M.D.   On: 05/19/2019 03:11    Procedures .Foreign Body Removal  Date/Time: 05/19/2019 4:13 AM Performed by: Cy BlamerPalumbo, Ahniya Mitchum, MD Authorized by: Cy BlamerPalumbo, Ransome Helwig, MD  Consent: Verbal consent obtained. Risks and benefits: risks, benefits and alternatives were discussed Consent given by: patient Patient understanding: patient states understanding of the procedure being performed Imaging studies: imaging studies available Patient identity confirmed: arm band Body area: skin General location: lower extremity Location details: left foot Anesthesia: local infiltration  Anesthesia: Local Anesthetic:  lidocaine 1% with epinephrine Anesthetic total: 3 mL  Sedation: Patient sedated: no  Patient restrained: no Patient cooperative: no Removal mechanism: forceps Dressing: antibiotic ointment and dressing applied Tendon involvement: none Complexity: complex 0 objects recovered. Objects recovered: 0 (based on imaging portior of the foreign body was removed) Post-procedure assessment: foreign body not removed Patient tolerance: patient tolerated the procedure well with no immediate complications Comments: 4 chlorhexidine swabs used then alcohol and iodine swab, small skin nick with 11 blade and then attempted irrigation and retrieval with forceps, unable to locate.     (including critical care time)  Medications Ordered in ED Medications  Tdap (BOOSTRIX) injection 0.5 mL (0.5 mLs Intramuscular Refused 05/19/19 0323)  lidocaine-EPINEPHrine (XYLOCAINE W/EPI) 2 %-1:200000 (PF) injection (has no administration in time range)  doxycycline (VIBRA-TABS) tablet 100 mg (has no administration in time range)  cephALEXin (KEFLEX) capsule 500 mg (has no administration in time range)  acetaminophen (TYLENOL) tablet 1,000 mg (1,000 mg Oral Given 05/19/19 0310)  ibuprofen (ADVIL) tablet 800 mg (800 mg Oral Given 05/19/19 0311)     Initial Impression / Assessment and Plan / ED Course  Will need retrieval by foot surgeon under sterile technique as unable to locate glass, based on Xray irrigation has removed some of the glass seen initially but not all.  Given DM history will start antibiotics and have advised twice daily dial soap soaks with warm water followed by neosporin.  Patient verbalizes understanding and agrees to follow up.    Michael PiggRichard Termini was evaluated in Emergency Department on 05/19/2019 for the symptoms described in the history of present illness. He was evaluated in the context of the global COVID-19 pandemic, which necessitated consideration that the patient might be at risk for infection with  the SARS-CoV-2 virus that causes COVID-19. Institutional protocols and algorithms that pertain to the evaluation of patients at risk for COVID-19 are in a state of rapid change based on information released by regulatory bodies including the CDC and federal and state organizations. These policies and algorithms  were followed during the patient's care in the ED.  Final Clinical Impressions(s) / ED Diagnoses   Final diagnoses:  Foreign body in left foot, initial encounter   Return for intractable cough, coughing up blood,fevers >100.4 unrelieved by medication, shortness of breath, intractable vomiting, chest pain, shortness of breath, weakness,numbness, changes in speech, facial asymmetry,abdominal pain, passing out,Inability to tolerate liquids or food, cough, altered mental status or any concerns. No signs of systemic illness or infection. The patient is nontoxic-appearing on exam and vital signs are within normal limits.   I have reviewed the triage vital signs and the nursing notes. Pertinent labs &imaging results that were available during my care of the patient were reviewed by me and considered in my medical decision making (see chart for details).  After history, exam, and medical workup I feel the patient has been appropriately medically screened and is safe for discharge home. Pertinent diagnoses were discussed with the patient. Patient was given return precautions ED Discharge Orders         Ordered    doxycycline (VIBRAMYCIN) 100 MG capsule  2 times daily     05/19/19 0404    cephALEXin (KEFLEX) 500 MG capsule  4 times daily     05/19/19 0404    naproxen (NAPROSYN) 500 MG tablet  2 times daily with meals     05/19/19 0404           Holiday Mcmenamin, MD 05/19/19 [id number]    Nicanor AlconPalumbo, Zaccai Chavarin, MD 05/19/19 [id number]

## 2019-05-20 ENCOUNTER — Other Ambulatory Visit: Payer: Self-pay

## 2019-05-20 DIAGNOSIS — Z20822 Contact with and (suspected) exposure to covid-19: Secondary | ICD-10-CM

## 2019-05-22 LAB — NOVEL CORONAVIRUS, NAA: SARS-CoV-2, NAA: NOT DETECTED

## 2019-05-24 ENCOUNTER — Encounter: Payer: Self-pay | Admitting: Family Medicine

## 2019-05-24 NOTE — Progress Notes (Signed)
Subjective:    Patient ID: Michael Foster, male    DOB: 12/16/1975, 43 y.o.   MRN: [id number]   CC: ED follow up  HPI: Foreign body  Seen in ED on 8/26 for foreign body. Repeat xray showed that irrigation removed some of glass but some still remained.  Was recommended for referral to surgeon for sterile removal. Was started on doxycycline and keflex.   Patient reports today he is overall doing well.  States that he is able to walk on the area.  Did report that yesterday he walked a significant amount and after that he leaked "a little pus".  Only noticed it on his bandage.  States that he bought himself a cane but does not use it.  Has been taking his Keflex and doxycycline, doxycycline has completed course but Keflex still has a few pills left.  Has been doing this daily.  Denies any fever, redness to the area, edema.  Diabetes When asked about his diabetes patient report that he knows he needs to do better.  Patient denies checking any glucose readings.  Recent A1c was elevated.  Reports that since being in quarantine he is not checking his diet very much and is eating anything he wants.  Is taking all his medications as prescribed however.  Reports that he knows he needs to be better about his diabetes.   Objective:  BP 127/80   Pulse 85   Wt (!) 323 lb 12.8 oz (146.9 kg)   SpO2 97%   BMI 47.82 kg/m  Vitals and nursing note reviewed  General: well nourished, in no acute distress HEENT: normocephalic  Cardiac: regular rate Respiratory: no increased work of breathing Extremities: no edema or cyanosis. Warm, well perfused. 2+ PT and DP pulses in left foot. Small area of healed wound on bottom on left foot. No erythema, warmth, or edema  Skin: warm and dry, no rashes noted Neuro: alert and oriented, no focal deficits   Assessment & Plan:    Foreign body in left foot I have reviewed x-rays from patient's ED visit on 8/26.  Patient continues to have some glass in his foot.  Area  appears closed today.  No significant erythema, edema.  I did not visualize any pustular drainage as the area is closed.  Will refer patient to general surgery for further extraction.  No need for antibiotics at this time his area does not appear acutely infected and he has recently finished antibiotic course with both doxycycline and Keflex.  I strongly advised that patient needs to control his diabetes.  Stressed the importance of checking his sugars especially since he is on insulin.  Patient states that he will begin today checking his blood sugars regularly.  I advised to check both fasting and preprandial sugars.  Patient will keep a log of his sugars and follow-up in 1 week to ensure that they are under control.  Advised that if sugars are above 250 that he needs to call and speak to a clinician in the office.  Patient has not seen his PCP yet and I advised that he make an appointment with PCP to establish care and follow-up with diabetes.  Follow-up after seeing surgeon so we can ensure he is doing well.  Strict return precautions given.  Diabetes (HCC) Previous A1c on 8/20 of 11.4.  Patient is compliant on all medications including insulin and metformin.  Patient is not checking his blood sugars.  I explained this is dangerous especially while on  insulin.  Stressed the importance of good diabetic control especially in preventing infection.  Risk of severe foot infection and amputation discussed with patient if he does not control his blood sugars.  Patient states he will start checking his blood sugars both fasting and preprandial and keep a log so he can discuss this with his PCP.  Follow-up in 1 week.  Patient has a virtual visit scheduled for Monday so he can discuss his sugars.  Advised that if he has any sugars above 250 that he please call clinic as he may need a change in his medications.  Patient advised that he needs to have regular follow-up with his PCP especially for chronic medical  conditions.  Strict return precautions given.  Follow-up in 1 week.    Return in about 1 week (around 06/03/2019).   Caroline More, DO, PGY-3

## 2019-05-27 ENCOUNTER — Ambulatory Visit (INDEPENDENT_AMBULATORY_CARE_PROVIDER_SITE_OTHER): Payer: 59 | Admitting: Family Medicine

## 2019-05-27 ENCOUNTER — Other Ambulatory Visit: Payer: Self-pay

## 2019-05-27 VITALS — BP 127/80 | HR 85 | Wt 323.8 lb

## 2019-05-27 DIAGNOSIS — S90852A Superficial foreign body, left foot, initial encounter: Secondary | ICD-10-CM | POA: Insufficient documentation

## 2019-05-27 DIAGNOSIS — E1165 Type 2 diabetes mellitus with hyperglycemia: Secondary | ICD-10-CM

## 2019-05-27 NOTE — Patient Instructions (Signed)
It was a pleasure seeing you today.   Today we discussed the glass in your foot  For your foot: I have referred you to surgery for further evaluation.  Please keep an eye on the area.  If you see significant pus drainage, the area getting more red, the area getting warm, the area getting more swollen please come back immediately.  If you have more pain please give [place] a call.  It will be very important that you keep good control of your sugars as good diabetic control will help prevent an infection.  If your sugars are elevated please call us immediately so we may change your medication so that you can have good control.  Follow-up after you have seen the surgeon.  Please follow up in 1 month after seeing your surgeon or sooner if symptoms persist or worsen. Please call the clinic immediately if you have any concerns.   Our clinic's number is [phone]. Please call with questions or concerns.   Please go to the emergency room if the area has pus drainage, more red, more swollen, more tender  Thank you,  Caroline More, DO

## 2019-05-27 NOTE — Assessment & Plan Note (Addendum)
I have reviewed x-rays from patient's ED visit on 8/26.  Patient continues to have some glass in his foot.  Area appears closed today.  No significant erythema, edema.  I did not visualize any pustular drainage as the area is closed.  Will refer patient to general surgery for further extraction.  No need for antibiotics at this time his area does not appear acutely infected and he has recently finished antibiotic course with both doxycycline and Keflex.  I strongly advised that patient needs to control his diabetes.  Stressed the importance of checking his sugars especially since he is on insulin.  Patient states that he will begin today checking his blood sugars regularly.  I advised to check both fasting and preprandial sugars.  Patient will keep a log of his sugars and follow-up in 1 week to ensure that they are under control.  Advised that if sugars are above 250 that he needs to call and speak to a clinician in the office.  Patient has not seen his PCP yet and I advised that he make an appointment with PCP to establish care and follow-up with diabetes.  Follow-up after seeing surgeon so we can ensure he is doing well.  Strict return precautions given.

## 2019-05-27 NOTE — Assessment & Plan Note (Addendum)
Previous A1c on 8/20 of 11.4.  Patient is compliant on all medications including insulin and metformin.  Patient is not checking his blood sugars.  I explained this is dangerous especially while on insulin.  Stressed the importance of good diabetic control especially in preventing infection.  Risk of severe foot infection and amputation discussed with patient if he does not control his blood sugars.  Patient states he will start checking his blood sugars both fasting and preprandial and keep a log so he can discuss this with his PCP.  Follow-up in 1 week.  Patient has a virtual visit scheduled for Monday so he can discuss his sugars.  Advised that if he has any sugars above 250 that he please call clinic as he may need a change in his medications.  Patient advised that he needs to have regular follow-up with his PCP especially for chronic medical conditions.  Strict return precautions given.  Follow-up in 1 week.

## 2019-06-01 ENCOUNTER — Encounter (HOSPITAL_BASED_OUTPATIENT_CLINIC_OR_DEPARTMENT_OTHER): Payer: Self-pay | Admitting: Emergency Medicine

## 2019-06-01 ENCOUNTER — Other Ambulatory Visit: Payer: Self-pay

## 2019-06-01 ENCOUNTER — Telehealth (INDEPENDENT_AMBULATORY_CARE_PROVIDER_SITE_OTHER): Payer: 59 | Admitting: Family Medicine

## 2019-06-01 ENCOUNTER — Emergency Department (HOSPITAL_BASED_OUTPATIENT_CLINIC_OR_DEPARTMENT_OTHER)
Admission: EM | Admit: 2019-06-01 | Discharge: 2019-06-01 | Disposition: A | Discharge status: Home / Self Care | Payer: 59 | Attending: Emergency Medicine | Admitting: Emergency Medicine

## 2019-06-01 DIAGNOSIS — J45909 Unspecified asthma, uncomplicated: Secondary | ICD-10-CM | POA: Diagnosis not present

## 2019-06-01 DIAGNOSIS — E119 Type 2 diabetes mellitus without complications: Secondary | ICD-10-CM | POA: Insufficient documentation

## 2019-06-01 DIAGNOSIS — Z87891 Personal history of nicotine dependence: Secondary | ICD-10-CM | POA: Diagnosis not present

## 2019-06-01 DIAGNOSIS — I10 Essential (primary) hypertension: Secondary | ICD-10-CM | POA: Diagnosis not present

## 2019-06-01 DIAGNOSIS — Z7982 Long term (current) use of aspirin: Secondary | ICD-10-CM | POA: Insufficient documentation

## 2019-06-01 DIAGNOSIS — Z794 Long term (current) use of insulin: Secondary | ICD-10-CM | POA: Diagnosis not present

## 2019-06-01 DIAGNOSIS — Z8673 Personal history of transient ischemic attack (TIA), and cerebral infarction without residual deficits: Secondary | ICD-10-CM | POA: Insufficient documentation

## 2019-06-01 DIAGNOSIS — S90852D Superficial foreign body, left foot, subsequent encounter: Secondary | ICD-10-CM

## 2019-06-01 DIAGNOSIS — E1165 Type 2 diabetes mellitus with hyperglycemia: Secondary | ICD-10-CM | POA: Diagnosis not present

## 2019-06-01 DIAGNOSIS — Z79899 Other long term (current) drug therapy: Secondary | ICD-10-CM | POA: Diagnosis not present

## 2019-06-01 DIAGNOSIS — Z4800 Encounter for change or removal of nonsurgical wound dressing: Secondary | ICD-10-CM | POA: Insufficient documentation

## 2019-06-01 DIAGNOSIS — Z5189 Encounter for other specified aftercare: Secondary | ICD-10-CM

## 2019-06-01 MED ORDER — ROSUVASTATIN CALCIUM 5 MG PO TABS: 5.0000 mg | ORAL_TABLET | Freq: Every day | ORAL | 3 refills | Status: DC

## 2019-06-01 NOTE — Assessment & Plan Note (Signed)
Reports worsening with concerns for abscess formation.  Patient elects to be seen in the ED rather than surgery office for I&D.  Continuing on antibiotics.  Denies systemic symptoms.  Red flags discussed.

## 2019-06-01 NOTE — ED Provider Notes (Signed)
[facility] EMERGENCY DEPARTMENT Provider Note   CSN: [id number] Arrival date & time: 06/01/19  1356     History   Chief Complaint Chief Complaint  Patient presents with  . Wound Check    HPI Randel PiggRichard Stroot is a 43 y.o. male.     The history is provided by the patient.  Wound Check This is a chronic problem. The current episode started more than 1 week ago. The problem has been resolved. Nothing aggravates the symptoms. Relieved by: antibiotics. Treatments tried: antibiotics  The treatment provided significant relief.    Past Medical History:  Diagnosis Date  . ADD (attention deficit disorder)   . Asthma    as a child  . Diabetes mellitus Dx 2009  . Gastroenteritis   . H/O TIA (transient ischemic attack) and stroke 01/04/2015   06/2014 with L hand numbness   . Hypertension Dx 2009  . Vertigo     Patient Active Problem List   Diagnosis Date Noted  . Foreign body in left foot 05/27/2019  . Low back sprain 05/13/2019  . Exposure to Covid-19 Virus 05/13/2019  . Peptic ulcer disease 01/02/2019  . Yeast infection 08/19/2018  . Depression 03/11/2018  . Dyspnea 07/06/2015  . Screening for STD (sexually transmitted disease) 06/09/2015  . H/O TIA (transient ischemic attack) and stroke 01/04/2015  . Diabetes (HCC) 06/10/2013  . Morbid obesity (HCC) 06/10/2013  . Asthma, chronic 06/10/2013  . Backache 03/22/2010  . HYPERLIPIDEMIA 01/17/2010  . Essential hypertension 01/17/2010  . OSA on CPAP 01/17/2010    Past Surgical History:  Procedure Laterality Date  . MOUTH SURGERY          Home Medications    Prior to Admission medications   Medication Sig Start Date End Date Taking? Authorizing Provider  aspirin EC 81 MG tablet Take 81 mg by mouth daily.   Yes [provider]  omeprazole (PRILOSEC) 20 MG capsule Take 20 mg by mouth daily.   Yes [provider]  albuterol (VENTOLIN HFA) 108 (90 Base) MCG/ACT inhaler INHALE 2 PUFFS INTO THE  LUNGS EVERY 4 HOURS AS NEEDED FOR WHEEZING OR SHORTNESS OF BREATH 05/19/19   Mirian MoFrank, Peter, MD  cephALEXin (KEFLEX) 500 MG capsule Take 1 capsule (500 mg total) by mouth 4 (four) times daily. 05/19/19   Palumbo, April, MD  fluticasone (FLOVENT HFA) 44 MCG/ACT inhaler Inhale 2 puffs into the lungs daily. 05/19/19   Mirian MoFrank, Peter, MD  glucose blood (ACCU-CHEK GUIDE) test strip Use as instructed 05/08/18   McDonald, Mia A, PA-C  Insulin Glargine (LANTUS SOLOSTAR) 100 UNIT/ML Solostar Pen Inject under skin 25units every morning. 05/13/19   Anderson, Chelsey L, DO  Insulin Pen Needle (B-D ULTRAFINE III SHORT PEN) 31G X 8 MM MISC Use daily 03/04/19   Diallo, Lilia ArgueAbdoulaye, MD  Insulin Pen Needle 32G X 6 MM MISC 20 Units by Does not apply route daily. 05/08/18   McDonald, Mia A, PA-C  lisinopril (PRINIVIL,ZESTRIL) 2.5 MG tablet Take 1 tablet (2.5 mg total) by mouth daily. 05/08/18   McDonald, Mia A, PA-C  metFORMIN (GLUCOPHAGE) 500 MG tablet TAKE 1 TABLET(500 MG) BY MOUTH TWICE DAILY WITH A MEAL 02/11/19   Diallo, Abdoulaye, MD  OZEMPIC, 0.25 OR 0.5 MG/DOSE, 2 MG/1.5ML SOPN INJECT 0.25 MG UNDER THE SKIN ONCE WEEKLY 05/05/19   Mirian MoFrank, Peter, MD  rosuvastatin (CRESTOR) 5 MG tablet Take 1 tablet (5 mg total) by mouth daily. 06/01/19   Ellwood Denseumball, Alison, DO  sertraline (ZOLOFT) 50 MG  tablet Take 1 tablet (50 mg total) by mouth daily. 04/05/19   Matilde Haymaker, MD    Family History Family History  Problem Relation Age of Onset  . COPD Father   . Hypertension Father   . Heart failure Father   . Diabetes Father   . Diabetes Mother   . Hyperlipidemia Mother   . Sleep apnea Brother   . Hypertension Brother     Social History Social History   Tobacco Use  . Smoking status: Former Smoker    Packs/day: 0.30    Years: 10.00    Pack years: 3.00    Types: Cigarettes    Quit date: 05/28/2013    Years since quitting: 6.0  . Smokeless tobacco: Never Used  Substance Use Topics  . Alcohol use: No  . Drug use: No      Allergies   Slo-bid gyrocaps [theophylline]   Review of Systems Review of Systems  Skin: Negative for color change, pallor, rash and wound.     Physical Exam Updated Vital Signs BP (!) 149/73 (BP Location: Left Arm)   Pulse 80   Temp 98.5 F (36.9 C) (Oral)   Resp 16   Ht 5\' 9"  (1.753 m)   Wt (!) 147.9 kg   SpO2 97%   BMI 48.14 kg/m   Physical Exam Skin:    General: Skin is warm and dry.     Capillary Refill: Capillary refill takes less than 2 seconds.     Coloration: Skin is not jaundiced or pale.     Findings: No bruising, erythema, lesion or rash.     Comments: Left foot without any signs of infection, wound to the bottom of the left foot appears healed over with no signs of infection, no purulent drainage      ED Treatments / Results  Labs (all labs ordered are listed, but only abnormal results are displayed) Labs Reviewed - No data to display  EKG None  Radiology No results found.  Procedures Procedures (including critical care time)  Medications Ordered in ED Medications - No data to display   Initial Impression / Assessment and Plan / ED Course  I have reviewed the triage vital signs and the nursing notes.  Pertinent labs & imaging results that were available during my care of the patient were reviewed by me and considered in my medical decision making (see chart for details).        Geofrey Silliman is a 43 year old male with history of diabetes here for wound recheck.  Patient had foreign body in his left foot several weeks ago.  Is on antibiotics.  Vital signs normal.  Left foot is well-appearing with no signs of infection.  Overall wound has likely fully healed.  Recommend follow-up primary care doctor.  Discharged in ED good condition.  This chart was dictated using voice recognition software.  Despite best efforts to proofread,  errors can occur which can change the documentation meaning.    Final Clinical Impressions(s) / ED Diagnoses    Final diagnoses:  Visit for wound check    ED Discharge Orders    None       Lennice Sites, DO 06/01/19 1525

## 2019-06-01 NOTE — Patient Instructions (Signed)
Our plans for today:  - Check your blood sugars at least daily. This is especially important since you are on insulin. -You can certainly go to the ED to follow-up on the glass in your heel although it may be cheaper and more convenient for you to see the surgeon in the office.  If you start to notice excessive redness, warmth, chills or fevers, you need to go to the ED to be seen. -Make a lab appointment to obtain a cholesterol panel. -We are prescribing a cholesterol medication.  This was sent to your pharmacy. -Make an appointment to see your regular doctor, Dr. Pilar Plate, in a few weeks to follow-up on your diabetes.  Take care and seek immediate care sooner if you develop any concerns.   Dr. [facility]

## 2019-06-01 NOTE — Progress Notes (Signed)
[facility] Telemedicine Visit  Patient consented to have virtual visit. Method of visit: Telephone  Encounter participants: Patient: Michael Foster - located at home Provider: Rory Percy - located at [facility] Others (if applicable): n/a  Chief Complaint: Foot follow-up  HPI:  Had visit 9/3 to follow-up on foreign body in left foot for ED visit 8/26.  At that time, his blood sugars were uncontrolled and he had not been checking his blood sugars.  Reports the area where glass was in has healed but seems like pus is starting to form. No surrounding redness. No fevers. Able to walk on feet. Bought cane to walk but can't walk for long. Has finished doxycycline. Taking keflex TID instead of QID, sometimes forgets to take it, has few doses left. Keeping area clean and soaking foot in warm soapy water. Putting neosporin with waterproof bandage. Reports he would rather go to the ED to have it lanced rather than be seen in the surgery office.  Diabetes, Type 2 - Last A1c 11.4 05/13/19 - Medications: Lantus 25 units daily, metformin 1000 mg twice daily, Ozempic 0.25 mg once weekly (on Sunday nights) - Compliance: sometimes misses morning doses of metformin.  - Checking BG at home: yes since last appt, 336, 223. - Diet: recently changed diet, cut down on rice. Eating veggies, fish, pork. Switched to sugar free snacks. Stopped snacking at night and eating out. Drinking more water. Does drink Diet Mt. Dew. - Exercise: bought exercise bike, BID - Eye exam: UTD, no retinopathy - Foot exam: due - Microalbumin: n/a - Statin: no - Denies symptoms of hypoglycemia, polyuria, polydipsia, numbness extremities  ROS: per HPI  Pertinent PMHx: HTN, asthma, OSA on CPAP, PUD, depression, diabetes, HLD, obesity  Exam:  Respiratory: speaks in full sentences, no respiratory distress  Assessment/Plan:  Diabetes (HCC) Uncontrolled, has not been checking his blood sugars slightly as he  wants she wait until his diet is better controlled.  Advised patient to be more compliant on medication plan check sugars regularly.  Patient has recently changed his diet, this will hopefully result in weight loss and better control of blood sugars for likely does need to have medication adjustment, advised to make appointment with PCP within a few weeks.  Lipid panel ordered today.  Statin prescription sent to pharmacy.  Foreign body in left foot Reports worsening with concerns for abscess formation.  Patient elects to be seen in the ED rather than surgery office for I&D.  Continuing on antibiotics.  Denies systemic symptoms.  Red flags discussed.    Time spent during visit with patient: 17 minutes

## 2019-06-01 NOTE — ED Triage Notes (Signed)
Recheck foot wound that was treated here for Foreign body of glass.  Pt sts he followed the instructions given to him and now the area is purulent and he believes there may be some more glass that is now ready to come out.

## 2019-06-01 NOTE — Assessment & Plan Note (Addendum)
Uncontrolled, has not been checking his blood sugars slightly as he wants she wait until his diet is better controlled.  Advised patient to be more compliant on medication plan check sugars regularly.  Patient has recently changed his diet, this will hopefully result in weight loss and better control of blood sugars for likely does need to have medication adjustment, advised to make appointment with PCP within a few weeks.  Lipid panel ordered today.  Statin prescription sent to pharmacy.

## 2019-06-04 ENCOUNTER — Encounter: Payer: Self-pay | Admitting: Family Medicine

## 2019-06-18 ENCOUNTER — Other Ambulatory Visit: Payer: Self-pay

## 2019-06-18 MED ORDER — METFORMIN HCL 500 MG PO TABS: ORAL_TABLET | ORAL | 3 refills | Status: DC

## 2019-06-23 ENCOUNTER — Encounter: Payer: Self-pay | Admitting: Family Medicine

## 2019-06-24 ENCOUNTER — Encounter: Payer: Self-pay | Admitting: Family Medicine

## 2019-07-01 ENCOUNTER — Other Ambulatory Visit: Payer: Self-pay

## 2019-07-01 DIAGNOSIS — Z20822 Contact with and (suspected) exposure to covid-19: Secondary | ICD-10-CM

## 2019-07-02 LAB — NOVEL CORONAVIRUS, NAA: SARS-CoV-2, NAA: NOT DETECTED

## 2019-07-06 ENCOUNTER — Other Ambulatory Visit: Payer: Self-pay

## 2019-07-06 DIAGNOSIS — J4520 Mild intermittent asthma, uncomplicated: Secondary | ICD-10-CM

## 2019-07-08 MED ORDER — ALBUTEROL SULFATE HFA 108 (90 BASE) MCG/ACT IN AERS: INHALATION_SPRAY | RESPIRATORY_TRACT | 1 refills | Status: DC

## 2019-07-31 ENCOUNTER — Emergency Department (HOSPITAL_BASED_OUTPATIENT_CLINIC_OR_DEPARTMENT_OTHER): Payer: 59

## 2019-07-31 ENCOUNTER — Other Ambulatory Visit: Payer: Self-pay

## 2019-07-31 ENCOUNTER — Encounter (HOSPITAL_BASED_OUTPATIENT_CLINIC_OR_DEPARTMENT_OTHER): Payer: Self-pay | Admitting: Emergency Medicine

## 2019-07-31 ENCOUNTER — Emergency Department (HOSPITAL_BASED_OUTPATIENT_CLINIC_OR_DEPARTMENT_OTHER)
Admission: EM | Admit: 2019-07-31 | Discharge: 2019-07-31 | Disposition: A | Discharge status: Home / Self Care | Payer: 59 | Attending: Emergency Medicine | Admitting: Emergency Medicine

## 2019-07-31 DIAGNOSIS — E119 Type 2 diabetes mellitus without complications: Secondary | ICD-10-CM | POA: Insufficient documentation

## 2019-07-31 DIAGNOSIS — I10 Essential (primary) hypertension: Secondary | ICD-10-CM | POA: Insufficient documentation

## 2019-07-31 DIAGNOSIS — Z87891 Personal history of nicotine dependence: Secondary | ICD-10-CM | POA: Diagnosis not present

## 2019-07-31 DIAGNOSIS — M79672 Pain in left foot: Secondary | ICD-10-CM | POA: Diagnosis not present

## 2019-07-31 DIAGNOSIS — Z79899 Other long term (current) drug therapy: Secondary | ICD-10-CM | POA: Diagnosis not present

## 2019-07-31 DIAGNOSIS — Z888 Allergy status to other drugs, medicaments and biological substances status: Secondary | ICD-10-CM | POA: Diagnosis not present

## 2019-07-31 DIAGNOSIS — Z7982 Long term (current) use of aspirin: Secondary | ICD-10-CM | POA: Insufficient documentation

## 2019-07-31 DIAGNOSIS — Z794 Long term (current) use of insulin: Secondary | ICD-10-CM | POA: Insufficient documentation

## 2019-07-31 DIAGNOSIS — M79673 Pain in unspecified foot: Secondary | ICD-10-CM

## 2019-07-31 NOTE — ED Triage Notes (Signed)
L foot pain. He states he stepped on glass back in august and there is still some in his foot.

## 2019-07-31 NOTE — Discharge Instructions (Signed)
You probably do have a small retained piece of glass in your foot although it's hard to definitively tell on x-ray. At this point you need to see an orthopedic surgeon.

## 2019-08-03 NOTE — ED Provider Notes (Signed)
[facility] EMERGENCY DEPARTMENT Provider Note   CSN: [id number] Arrival date & time: 07/31/19  1159     History   Chief Complaint Chief Complaint  Patient presents with  . Foot Pain    HPI Michael Foster is a 43 y.o. male.     HPI   43 year old male with left foot pain.  Patient reports that he stepped on a piece of glass several months ago.  There was irrigated and some was removed.  He feels like he may have a retained piece.  Persistent pain and mild swelling in the plantar surface of the foot.  No drainage.  No fevers or chills.  Past Medical History:  Diagnosis Date  . ADD (attention deficit disorder)   . Asthma    as a child  . Diabetes mellitus Dx 2009  . Gastroenteritis   . H/O TIA (transient ischemic attack) and stroke 01/04/2015   06/2014 with L hand numbness   . Hypertension Dx 2009  . Vertigo     Patient Active Problem List   Diagnosis Date Noted  . Foreign body in left foot 05/27/2019  . Low back sprain 05/13/2019  . Exposure to COVID-19 virus 05/13/2019  . Peptic ulcer disease 01/02/2019  . Yeast infection 08/19/2018  . Depression 03/11/2018  . Dyspnea 07/06/2015  . Screening for STD (sexually transmitted disease) 06/09/2015  . H/O TIA (transient ischemic attack) and stroke 01/04/2015  . Diabetes (HCC) 06/10/2013  . Morbid obesity (HCC) 06/10/2013  . Asthma, chronic 06/10/2013  . Backache 03/22/2010  . HYPERLIPIDEMIA 01/17/2010  . Essential hypertension 01/17/2010  . OSA on CPAP 01/17/2010    Past Surgical History:  Procedure Laterality Date  . MOUTH SURGERY          Home Medications    Prior to Admission medications   Medication Sig Start Date End Date Taking? Authorizing Provider  albuterol (VENTOLIN HFA) 108 (90 Base) MCG/ACT inhaler INHALE 2 PUFFS INTO THE LUNGS EVERY 4 HOURS AS NEEDED FOR WHEEZING OR SHORTNESS OF BREATH 07/08/19   Mirian Mo, MD  aspirin EC 81 MG tablet Take 81 mg by mouth daily.    [provider]  cephALEXin (KEFLEX) 500 MG capsule Take 1 capsule (500 mg total) by mouth 4 (four) times daily. 05/19/19   Palumbo, April, MD  fluticasone (FLOVENT HFA) 44 MCG/ACT inhaler Inhale 2 puffs into the lungs daily. 05/19/19   Mirian Mo, MD  glucose blood (ACCU-CHEK GUIDE) test strip Use as instructed 05/08/18   McDonald, Mia A, PA-C  Insulin Glargine (LANTUS SOLOSTAR) 100 UNIT/ML Solostar Pen Inject under skin 25units every morning. 05/13/19   Anderson, Chelsey L, DO  Insulin Pen Needle (B-D ULTRAFINE III SHORT PEN) 31G X 8 MM MISC Use daily 03/04/19   Diallo, Lilia Argue, MD  Insulin Pen Needle 32G X 6 MM MISC 20 Units by Does not apply route daily. 05/08/18   McDonald, Mia A, PA-C  lisinopril (PRINIVIL,ZESTRIL) 2.5 MG tablet Take 1 tablet (2.5 mg total) by mouth daily. 05/08/18   McDonald, Mia A, PA-C  metFORMIN (GLUCOPHAGE) 500 MG tablet TAKE 1 TABLET(500 MG) BY MOUTH TWICE DAILY WITH A MEAL 06/18/19   Mirian Mo, MD  omeprazole (PRILOSEC) 20 MG capsule Take 20 mg by mouth daily.    [provider]  OZEMPIC, 0.25 OR 0.5 MG/DOSE, 2 MG/1.5ML SOPN INJECT 0.25 MG UNDER THE SKIN ONCE WEEKLY 05/05/19   Mirian Mo, MD  rosuvastatin (CRESTOR) 5 MG tablet Take 1 tablet (5 mg  total) by mouth daily. 06/01/19   Rory Percy, DO  sertraline (ZOLOFT) 50 MG tablet Take 1 tablet (50 mg total) by mouth daily. 04/05/19   Matilde Haymaker, MD    Family History Family History  Problem Relation Age of Onset  . COPD Father   . Hypertension Father   . Heart failure Father   . Diabetes Father   . Diabetes Mother   . Hyperlipidemia Mother   . Sleep apnea Brother   . Hypertension Brother     Social History Social History   Tobacco Use  . Smoking status: Former Smoker    Packs/day: 0.30    Years: 10.00    Pack years: 3.00    Types: Cigarettes    Quit date: 05/28/2013    Years since quitting: 6.1  . Smokeless tobacco: Never Used  Substance Use Topics  . Alcohol use: No  . Drug use: No      Allergies   Slo-bid gyrocaps [theophylline]   Review of Systems Review of Systems  All systems reviewed and negative, other than as noted in HPI.  Physical Exam Updated Vital Signs BP 135/83 (BP Location: Left Arm)   Pulse 82   Temp 99.3 F (37.4 C) (Oral)   Resp 18   Ht 5\' 9"  (1.753 m)   Wt (!) 149.7 kg   SpO2 99%   BMI 48.73 kg/m   Physical Exam Vitals signs and nursing note reviewed.  Constitutional:      General: He is not in acute distress.    Appearance: He is well-developed.  HENT:     Head: Normocephalic and atraumatic.  Eyes:     General:        Right eye: No discharge.        Left eye: No discharge.     Conjunctiva/sclera: Conjunctivae normal.  Neck:     Musculoskeletal: Neck supple.  Cardiovascular:     Rate and Rhythm: Normal rate and regular rhythm.     Heart sounds: Normal heart sounds. No murmur. No friction rub. No gallop.   Pulmonary:     Effort: Pulmonary effort is normal. No respiratory distress.     Breath sounds: Normal breath sounds.  Abdominal:     General: There is no distension.     Palpations: Abdomen is soft.     Tenderness: There is no abdominal tenderness.  Musculoskeletal:        General: No tenderness.     Comments: Small lesion to the plantar aspect of the mid to posterior foot.  Feels like a superficial granular area with skin thickening.  No drainage.  No obvious foreign body seen or palpated superficially.  Skin:    General: Skin is warm and dry.  Neurological:     Mental Status: He is alert.  Psychiatric:        Behavior: Behavior normal.        Thought Content: Thought content normal.      ED Treatments / Results  Labs (all labs ordered are listed, but only abnormal results are displayed) Labs Reviewed - No data to display  EKG None  Radiology No results found.  Procedures Procedures (including critical care time)  Medications Ordered in ED Medications - No data to display   Initial Impression /  Assessment and Plan / ED Course  I have reviewed the triage vital signs and the nursing notes.  Pertinent labs & imaging results that were available during my care of the patient were reviewed  by me and considered in my medical decision making (see chart for details).       43 year old male with persistent left foot pain.  Likely has a small retained piece of glass.  Given the duration of this injury I think that he should be evaluated by orthopedics or surgery for possible removal.  Does not appear to be infected.  Final Clinical Impressions(s) / ED Diagnoses   Final diagnoses:  Pain of foot, unspecified laterality    ED Discharge Orders    None       Raeford RazorKohut, Oakes Mccready, MD 08/03/19 1032

## 2019-08-10 ENCOUNTER — Other Ambulatory Visit: Payer: Self-pay | Admitting: *Deleted

## 2019-08-10 DIAGNOSIS — J4520 Mild intermittent asthma, uncomplicated: Secondary | ICD-10-CM

## 2019-08-11 MED ORDER — ALBUTEROL SULFATE HFA 108 (90 BASE) MCG/ACT IN AERS: INHALATION_SPRAY | RESPIRATORY_TRACT | 1 refills | Status: DC

## 2019-08-11 NOTE — Telephone Encounter (Signed)
2nd request from pharmacy. Elridge Stemm,CMA  

## 2019-09-20 ENCOUNTER — Other Ambulatory Visit: Payer: Self-pay | Admitting: *Deleted

## 2019-09-20 DIAGNOSIS — J4520 Mild intermittent asthma, uncomplicated: Secondary | ICD-10-CM

## 2019-09-27 MED ORDER — ALBUTEROL SULFATE HFA 108 (90 BASE) MCG/ACT IN AERS: INHALATION_SPRAY | RESPIRATORY_TRACT | 1 refills | Status: DC

## 2019-10-05 ENCOUNTER — Encounter (HOSPITAL_COMMUNITY): Payer: Self-pay | Admitting: Emergency Medicine

## 2019-10-05 ENCOUNTER — Other Ambulatory Visit: Payer: Self-pay

## 2019-10-05 ENCOUNTER — Emergency Department (HOSPITAL_COMMUNITY)
Admission: EM | Admit: 2019-10-05 | Discharge: 2019-10-05 | Discharge status: Against Medical Advice | Payer: 59 | Attending: Emergency Medicine | Admitting: Emergency Medicine

## 2019-10-05 DIAGNOSIS — Z5321 Procedure and treatment not carried out due to patient leaving prior to being seen by health care provider: Secondary | ICD-10-CM | POA: Insufficient documentation

## 2019-10-05 DIAGNOSIS — R509 Fever, unspecified: Secondary | ICD-10-CM | POA: Diagnosis present

## 2019-10-05 MED ORDER — ACETAMINOPHEN 325 MG PO TABS
650.0000 mg | ORAL_TABLET | Freq: Once | ORAL | Status: AC
  Administered 2019-10-05: 650 mg via ORAL
  Filled 2019-10-05: qty 2

## 2019-10-05 NOTE — ED Triage Notes (Signed)
Patient reports fever , chills , runny nose and fatigue onset this afternoon . Respirations unlabored.

## 2019-10-05 NOTE — ED Notes (Signed)
Pt stated that he was leaving.  

## 2019-10-07 ENCOUNTER — Ambulatory Visit: Payer: 59 | Attending: Internal Medicine

## 2019-10-07 DIAGNOSIS — Z20822 Contact with and (suspected) exposure to covid-19: Secondary | ICD-10-CM

## 2019-10-08 LAB — NOVEL CORONAVIRUS, NAA: SARS-CoV-2, NAA: NOT DETECTED

## 2019-10-10 ENCOUNTER — Other Ambulatory Visit: Payer: Self-pay | Admitting: Family Medicine

## 2019-10-10 DIAGNOSIS — J4520 Mild intermittent asthma, uncomplicated: Secondary | ICD-10-CM

## 2019-10-11 ENCOUNTER — Other Ambulatory Visit: Payer: Self-pay

## 2019-10-11 DIAGNOSIS — F329 Major depressive disorder, single episode, unspecified: Secondary | ICD-10-CM

## 2019-10-11 DIAGNOSIS — F32A Depression, unspecified: Secondary | ICD-10-CM

## 2019-10-11 MED ORDER — SERTRALINE HCL 50 MG PO TABS: 50.0000 mg | ORAL_TABLET | Freq: Every day | ORAL | 5 refills | Status: DC

## 2019-10-14 ENCOUNTER — Encounter: Payer: Self-pay | Admitting: Family Medicine

## 2019-11-01 ENCOUNTER — Other Ambulatory Visit: Payer: Self-pay | Admitting: *Deleted

## 2019-11-01 DIAGNOSIS — E1165 Type 2 diabetes mellitus with hyperglycemia: Secondary | ICD-10-CM

## 2019-11-01 MED ORDER — OZEMPIC (0.25 OR 0.5 MG/DOSE) 2 MG/1.5ML ~~LOC~~ SOPN: 0.2500 mg | PEN_INJECTOR | SUBCUTANEOUS | 2 refills | Status: DC

## 2019-11-03 ENCOUNTER — Telehealth: Payer: Self-pay | Admitting: *Deleted

## 2019-11-03 NOTE — Telephone Encounter (Signed)
Received fax from pharmacy, PA needed on Ozempic.  Clinical questions submitted via Cover My Meds.  Waiting on response, could take up to 72 hours.  Cover My Meds info: Key: [id number]  Jone Baseman, CMA

## 2019-11-03 NOTE — Telephone Encounter (Signed)
    LMOVM of pharmacy that med was approved.  Jone Baseman, CMA

## 2019-11-08 ENCOUNTER — Other Ambulatory Visit: Payer: Self-pay | Admitting: Family Medicine

## 2019-11-08 DIAGNOSIS — J4520 Mild intermittent asthma, uncomplicated: Secondary | ICD-10-CM

## 2019-11-22 ENCOUNTER — Other Ambulatory Visit: Payer: Self-pay | Admitting: *Deleted

## 2019-11-22 MED ORDER — BD PEN NEEDLE SHORT U/F 31G X 8 MM MISC: 2 refills | Status: DC

## 2019-11-22 MED ORDER — METFORMIN HCL 500 MG PO TABS: ORAL_TABLET | ORAL | 3 refills | Status: DC

## 2019-12-01 ENCOUNTER — Other Ambulatory Visit: Payer: Self-pay | Admitting: Student in an Organized Health Care Education/Training Program

## 2019-12-01 DIAGNOSIS — E1165 Type 2 diabetes mellitus with hyperglycemia: Secondary | ICD-10-CM

## 2019-12-12 ENCOUNTER — Other Ambulatory Visit: Payer: Self-pay | Admitting: Family Medicine

## 2019-12-12 DIAGNOSIS — J4520 Mild intermittent asthma, uncomplicated: Secondary | ICD-10-CM

## 2020-01-14 ENCOUNTER — Other Ambulatory Visit: Payer: Self-pay | Admitting: Family Medicine

## 2020-01-14 DIAGNOSIS — J4520 Mild intermittent asthma, uncomplicated: Secondary | ICD-10-CM

## 2020-01-27 ENCOUNTER — Other Ambulatory Visit: Payer: Self-pay

## 2020-01-27 ENCOUNTER — Encounter (HOSPITAL_BASED_OUTPATIENT_CLINIC_OR_DEPARTMENT_OTHER): Payer: Self-pay | Admitting: Emergency Medicine

## 2020-01-27 ENCOUNTER — Emergency Department (HOSPITAL_BASED_OUTPATIENT_CLINIC_OR_DEPARTMENT_OTHER)
Admission: EM | Admit: 2020-01-27 | Discharge: 2020-01-28 | Disposition: A | Discharge status: Home / Self Care | Payer: 59 | Attending: Emergency Medicine | Admitting: Emergency Medicine

## 2020-01-27 DIAGNOSIS — K859 Acute pancreatitis without necrosis or infection, unspecified: Secondary | ICD-10-CM

## 2020-01-27 DIAGNOSIS — Z87891 Personal history of nicotine dependence: Secondary | ICD-10-CM | POA: Diagnosis not present

## 2020-01-27 DIAGNOSIS — Z794 Long term (current) use of insulin: Secondary | ICD-10-CM | POA: Diagnosis not present

## 2020-01-27 DIAGNOSIS — I10 Essential (primary) hypertension: Secondary | ICD-10-CM | POA: Insufficient documentation

## 2020-01-27 DIAGNOSIS — J45909 Unspecified asthma, uncomplicated: Secondary | ICD-10-CM | POA: Diagnosis not present

## 2020-01-27 DIAGNOSIS — Z7982 Long term (current) use of aspirin: Secondary | ICD-10-CM | POA: Diagnosis not present

## 2020-01-27 DIAGNOSIS — Z8673 Personal history of transient ischemic attack (TIA), and cerebral infarction without residual deficits: Secondary | ICD-10-CM | POA: Insufficient documentation

## 2020-01-27 DIAGNOSIS — Z79899 Other long term (current) drug therapy: Secondary | ICD-10-CM | POA: Insufficient documentation

## 2020-01-27 DIAGNOSIS — E119 Type 2 diabetes mellitus without complications: Secondary | ICD-10-CM | POA: Diagnosis not present

## 2020-01-27 DIAGNOSIS — Z888 Allergy status to other drugs, medicaments and biological substances status: Secondary | ICD-10-CM | POA: Insufficient documentation

## 2020-01-27 DIAGNOSIS — R1013 Epigastric pain: Secondary | ICD-10-CM | POA: Diagnosis present

## 2020-01-27 MED ORDER — ALUM & MAG HYDROXIDE-SIMETH 200-200-20 MG/5ML PO SUSP
30.0000 mL | Freq: Once | ORAL | Status: AC
  Administered 2020-01-28: 01:00:00 30 mL via ORAL
  Filled 2020-01-27: qty 30

## 2020-01-27 NOTE — ED Triage Notes (Signed)
Upper Abd pain for 2 days. Denies n/v/d or fevers.

## 2020-01-28 ENCOUNTER — Encounter (HOSPITAL_BASED_OUTPATIENT_CLINIC_OR_DEPARTMENT_OTHER): Payer: Self-pay | Admitting: Emergency Medicine

## 2020-01-28 ENCOUNTER — Emergency Department (HOSPITAL_BASED_OUTPATIENT_CLINIC_OR_DEPARTMENT_OTHER): Payer: 59

## 2020-01-28 LAB — CBC WITH DIFFERENTIAL/PLATELET
Abs Immature Granulocytes: 0.06 10*3/uL (ref 0.00–0.07)
Basophils Absolute: 0.1 10*3/uL (ref 0.0–0.1)
Basophils Relative: 1 %
Eosinophils Absolute: 0.4 10*3/uL (ref 0.0–0.5)
Eosinophils Relative: 3 %
HCT: 44.3 % (ref 39.0–52.0)
Hemoglobin: 14.2 g/dL (ref 13.0–17.0)
Immature Granulocytes: 1 %
Lymphocytes Relative: 31 %
Lymphs Abs: 3.6 10*3/uL (ref 0.7–4.0)
MCH: 26.6 pg (ref 26.0–34.0)
MCHC: 32.1 g/dL (ref 30.0–36.0)
MCV: 83.1 fL (ref 80.0–100.0)
Monocytes Absolute: 0.8 10*3/uL (ref 0.1–1.0)
Monocytes Relative: 7 %
Neutro Abs: 6.7 10*3/uL (ref 1.7–7.7)
Neutrophils Relative %: 57 %
Platelets: 365 10*3/uL (ref 150–400)
RBC: 5.33 MIL/uL (ref 4.22–5.81)
RDW: 13.2 % (ref 11.5–15.5)
WBC: 11.7 10*3/uL — ABNORMAL HIGH (ref 4.0–10.5)
nRBC: 0 % (ref 0.0–0.2)

## 2020-01-28 LAB — COMPREHENSIVE METABOLIC PANEL
ALT: 26 U/L (ref 0–44)
AST: 17 U/L (ref 15–41)
Albumin: 3.5 g/dL (ref 3.5–5.0)
Alkaline Phosphatase: 115 U/L (ref 38–126)
Anion gap: 8 (ref 5–15)
BUN: 14 mg/dL (ref 6–20)
CO2: 26 mmol/L (ref 22–32)
Calcium: 8.7 mg/dL — ABNORMAL LOW (ref 8.9–10.3)
Chloride: 103 mmol/L (ref 98–111)
Creatinine, Ser: 0.73 mg/dL (ref 0.61–1.24)
GFR calc Af Amer: >60 mL/min (ref >60)
GFR calc non Af Amer: >60 mL/min (ref >60)
Glucose, Bld: 238 mg/dL — ABNORMAL HIGH (ref 70–99)
Potassium: 3.9 mmol/L (ref 3.5–5.1)
Sodium: 137 mmol/L (ref 135–145)
Total Bilirubin: 0.5 mg/dL (ref 0.3–1.2)
Total Protein: 7.7 g/dL (ref 6.5–8.1)

## 2020-01-28 LAB — URINALYSIS, ROUTINE W REFLEX MICROSCOPIC
Bilirubin Urine: NEGATIVE
Glucose, UA: >=500 mg/dL — AB
Hgb urine dipstick: NEGATIVE
Ketones, ur: 15 mg/dL — AB
Leukocytes,Ua: NEGATIVE
Nitrite: NEGATIVE
Protein, ur: 30 mg/dL — AB
Specific Gravity, Urine: 1.02 (ref 1.005–1.030)
pH: 7 (ref 5.0–8.0)

## 2020-01-28 LAB — URINALYSIS, MICROSCOPIC (REFLEX): WBC, UA: NONE SEEN WBC/hpf (ref 0–5)

## 2020-01-28 LAB — LIPASE, BLOOD: Lipase: 107 U/L — ABNORMAL HIGH (ref 11–51)

## 2020-01-28 MED ORDER — ONDANSETRON 8 MG PO TBDP
ORAL_TABLET | ORAL | 0 refills | Status: DC
Start: 2020-01-28 — End: 2022-07-25

## 2020-01-28 MED ORDER — DICYCLOMINE HCL 20 MG PO TABS
20.0000 mg | ORAL_TABLET | Freq: Two times a day (BID) | ORAL | 0 refills | Status: DC
Start: 2020-01-28 — End: 2021-09-13

## 2020-01-28 MED ORDER — IOHEXOL 300 MG/ML  SOLN
100.0000 mL | Freq: Once | INTRAMUSCULAR | Status: AC
  Administered 2020-01-28: 01:00:00 100 mL via INTRAVENOUS

## 2020-01-28 MED ORDER — KETOROLAC TROMETHAMINE 30 MG/ML IJ SOLN
30.0000 mg | Freq: Once | INTRAMUSCULAR | Status: AC
  Administered 2020-01-28: 30 mg via INTRAVENOUS
  Filled 2020-01-28: qty 1

## 2020-01-28 MED ORDER — OMEPRAZOLE 20 MG PO CPDR
20.0000 mg | DELAYED_RELEASE_CAPSULE | Freq: Every day | ORAL | 0 refills | Status: DC
Start: 2020-01-28 — End: 2020-08-29

## 2020-01-28 NOTE — Discharge Instructions (Addendum)
Take 2 extra strength tylenol every 6 hours.

## 2020-01-28 NOTE — ED Provider Notes (Signed)
[facility] EMERGENCY DEPARTMENT Provider Note   CSN: [id number] Arrival date & time: 01/27/20  2326     History Chief Complaint  Patient presents with  . Abdominal Pain    Michael Foster is a 44 y.o. male.  The history is provided by the patient.  Abdominal Pain Pain location:  Epigastric, LUQ and periumbilical Pain quality: aching   Pain radiates to:  Does not radiate Pain severity:  Moderate Onset quality:  Gradual Duration:  2 days Timing:  Constant Progression:  Worsening Chronicity:  New Context: not alcohol use, not eating and not recent travel   Relieved by:  Nothing Worsened by:  Nothing Ineffective treatments:  None tried Associated symptoms: no anorexia, no belching, no chest pain, no chills, no constipation, no cough, no diarrhea, no dysuria, no fatigue, no fever, no hematuria, no nausea, no shortness of breath and no vomiting   Risk factors: no alcohol abuse and no aspirin use        Past Medical History:  Diagnosis Date  . ADD (attention deficit disorder)   . Asthma    as a child  . Diabetes mellitus Dx 2009  . Gastroenteritis   . H/O TIA (transient ischemic attack) and stroke 01/04/2015   06/2014 with L hand numbness   . Hypertension Dx 2009  . Vertigo     Patient Active Problem List   Diagnosis Date Noted  . Foreign body in left foot 05/27/2019  . Low back sprain 05/13/2019  . Exposure to COVID-19 virus 05/13/2019  . Peptic ulcer disease 01/02/2019  . Yeast infection 08/19/2018  . Depression 03/11/2018  . Dyspnea 07/06/2015  . Screening for STD (sexually transmitted disease) 06/09/2015  . H/O TIA (transient ischemic attack) and stroke 01/04/2015  . Diabetes (HCC) 06/10/2013  . Morbid obesity (HCC) 06/10/2013  . Asthma, chronic 06/10/2013  . Backache 03/22/2010  . HYPERLIPIDEMIA 01/17/2010  . Essential hypertension 01/17/2010  . OSA on CPAP 01/17/2010    Past Surgical History:  Procedure Laterality Date  . MOUTH SURGERY           Family History  Problem Relation Age of Onset  . COPD Father   . Hypertension Father   . Heart failure Father   . Diabetes Father   . Diabetes Mother   . Hyperlipidemia Mother   . Sleep apnea Brother   . Hypertension Brother     Social History   Tobacco Use  . Smoking status: Former Smoker    Packs/day: 0.30    Years: 10.00    Pack years: 3.00    Types: Cigarettes    Quit date: 05/28/2013    Years since quitting: 6.6  . Smokeless tobacco: Never Used  Substance Use Topics  . Alcohol use: No  . Drug use: No    Home Medications Prior to Admission medications   Medication Sig Start Date End Date Taking? Authorizing Provider  albuterol (VENTOLIN HFA) 108 (90 Base) MCG/ACT inhaler INHALE 2 PUFFS INTO THE LUNGS EVERY 4 HOURS AS NEEDED FOR WHEEZING OR SHORTNESS OF BREATH 01/14/20   Mirian Mo, MD  aspirin EC 81 MG tablet Take 81 mg by mouth daily.    [provider]  cephALEXin (KEFLEX) 500 MG capsule Take 1 capsule (500 mg total) by mouth 4 (four) times daily. 05/19/19   Gurkirat Basher, MD  dicyclomine (BENTYL) 20 MG tablet Take 1 tablet (20 mg total) by mouth 2 (two) times daily. 01/28/20   Athalene Kolle, MD  fluticasone (FLOVENT  HFA) 44 MCG/ACT inhaler Inhale 2 puffs into the lungs daily. 05/19/19   Mirian Mo, MD  glucose blood (ACCU-CHEK GUIDE) test strip Use as instructed 05/08/18   McDonald, Mia A, PA-C  insulin glargine (LANTUS SOLOSTAR) 100 UNIT/ML Solostar Pen ADMINISTER 25 UNITS UNDER THE SKIN EVERY MORNING Patient taking differently: Inject 26 Units into the skin at bedtime. ADMINISTER 25 UNITS UNDER THE SKIN EVERY MORNING 12/01/19   Mirian Mo, MD  Insulin Pen Needle (B-D ULTRAFINE III SHORT PEN) 31G X 8 MM MISC Use daily 11/22/19   Mirian Mo, MD  Insulin Pen Needle 32G X 6 MM MISC 20 Units by Does not apply route daily. 05/08/18   McDonald, Mia A, PA-C  lisinopril (PRINIVIL,ZESTRIL) 2.5 MG tablet Take 1 tablet (2.5 mg total) by mouth daily. 05/08/18    McDonald, Mia A, PA-C  metFORMIN (GLUCOPHAGE) 500 MG tablet TAKE 1 TABLET(500 MG) BY MOUTH TWICE DAILY WITH A MEAL 11/22/19   Mirian Mo, MD  omeprazole (PRILOSEC) 20 MG capsule Take 20 mg by mouth daily.    [provider]  omeprazole (PRILOSEC) 20 MG capsule Take 1 capsule (20 mg total) by mouth daily. 01/28/20   Tyton Abdallah, MD  ondansetron (ZOFRAN ODT) 8 MG disintegrating tablet  ODT q8 hours prn nausea 01/28/20   Nahun Kronberg, MD  rosuvastatin (CRESTOR) 5 MG tablet Take 1 tablet (5 mg total) by mouth daily. 06/01/19   Ellwood Dense, DO  Semaglutide,0.25 or 0.5MG /DOS, (OZEMPIC, 0.25 OR 0.5 MG/DOSE,) 2 MG/1.5ML SOPN Inject 0.25 mg into the skin once a week. 11/01/19   Mirian Mo, MD  sertraline (ZOLOFT) 50 MG tablet Take 1 tablet (50 mg total) by mouth daily. 10/11/19   Mirian Mo, MD    Allergies    Slo-bid gyrocaps [theophylline]  Review of Systems   Review of Systems  Constitutional: Negative for chills, fatigue and fever.  HENT: Negative for congestion.   Respiratory: Negative for cough and shortness of breath.   Cardiovascular: Negative for chest pain.  Gastrointestinal: Positive for abdominal pain. Negative for anorexia, constipation, diarrhea, nausea and vomiting.  Genitourinary: Negative for dysuria and hematuria.  Musculoskeletal: Negative for arthralgias.  Skin: Negative for color change.  Neurological: Negative for dizziness.  Psychiatric/Behavioral: Negative for agitation.  All other systems reviewed and are negative.   Physical Exam Updated Vital Signs BP (!) 163/93 (BP Location: Right Arm)   Pulse 87   Temp 98.6 F (37 C) (Oral)   Resp 18   Ht  (1.753 m)   Wt (!) 144.2 kg   SpO2 98%   BMI 46.96 kg/m   Physical Exam Vitals and nursing note reviewed.  Constitutional:      General: He is not in acute distress.    Appearance: Normal appearance.  HENT:     Head: Normocephalic and atraumatic.     Nose: Nose normal.  Eyes:      Conjunctiva/sclera: Conjunctivae normal.     Pupils: Pupils are equal, round, and reactive to light.  Cardiovascular:     Rate and Rhythm: Normal rate and regular rhythm.     Pulses: Normal pulses.     Heart sounds: Normal heart sounds.  Pulmonary:     Effort: Pulmonary effort is normal.     Breath sounds: Normal breath sounds.  Abdominal:     General: Abdomen is flat. Bowel sounds are normal.     Palpations: Abdomen is soft.     Tenderness: There is no abdominal tenderness. There is  no guarding or rebound. Negative signs include Murphy's sign and McBurney's sign.  Musculoskeletal:        General: Normal range of motion.     Cervical back: Normal range of motion and neck supple.  Skin:    General: Skin is warm and dry.     Capillary Refill: Capillary refill takes less than 2 seconds.  Neurological:     General: No focal deficit present.     Mental Status: He is alert and oriented to person, place, and time.     Deep Tendon Reflexes: Reflexes normal.  Psychiatric:        Mood and Affect: Mood normal.        Behavior: Behavior normal.     ED Results / Procedures / Treatments   Labs (all labs ordered are listed, but only abnormal results are displayed) Results for orders placed or performed during the hospital encounter of 01/27/20  Urinalysis, Routine w reflex microscopic  Result Value Ref Range   Color, Urine YELLOW YELLOW   APPearance CLEAR CLEAR   Specific Gravity, Urine 1.020 1.005 - 1.030   pH 7.0 5.0 - 8.0   Glucose, UA >=500 (A) NEGATIVE mg/dL   Hgb urine dipstick NEGATIVE NEGATIVE   Bilirubin Urine NEGATIVE NEGATIVE   Ketones, ur 15 (A) NEGATIVE mg/dL   Protein, ur 30 (A) NEGATIVE mg/dL   Nitrite NEGATIVE NEGATIVE   Leukocytes,Ua NEGATIVE NEGATIVE  CBC with Differential/Platelet  Result Value Ref Range   WBC 11.7 (H) 4.0 - 10.5 K/uL   RBC 5.33 4.22 - 5.81 MIL/uL   Hemoglobin 14.2 13.0 - 17.0 g/dL   HCT 16.144.3 09.639.0 - 04.552.0 %   MCV 83.1 80.0 - 100.0 fL   MCH 26.6  26.0 - 34.0 pg   MCHC 32.1 30.0 - 36.0 g/dL   RDW 40.913.2 81.111.5 - 91.415.5 %   Platelets 365 150 - 400 K/uL   nRBC 0.0 0.0 - 0.2 %   Neutrophils Relative % 57 %   Neutro Abs 6.7 1.7 - 7.7 K/uL   Lymphocytes Relative 31 %   Lymphs Abs 3.6 0.7 - 4.0 K/uL   Monocytes Relative 7 %   Monocytes Absolute 0.8 0.1 - 1.0 K/uL   Eosinophils Relative 3 %   Eosinophils Absolute 0.4 0.0 - 0.5 K/uL   Basophils Relative 1 %   Basophils Absolute 0.1 0.0 - 0.1 K/uL   Immature Granulocytes 1 %   Abs Immature Granulocytes 0.06 0.00 - 0.07 K/uL  Comprehensive metabolic panel  Result Value Ref Range   Sodium 137 135 - 145 mmol/L   Potassium 3.9 3.5 - 5.1 mmol/L   Chloride 103 98 - 111 mmol/L   CO2 26 22 - 32 mmol/L   Glucose, Bld 238 (H) 70 - 99 mg/dL   BUN 14 6 - 20 mg/dL   Creatinine, Ser 7.820.73 0.61 - 1.24 mg/dL   Calcium 8.7 (L) 8.9 - 10.3 mg/dL   Total Protein 7.7 6.5 - 8.1 g/dL   Albumin 3.5 3.5 - 5.0 g/dL   AST 17 15 - 41 U/L   ALT 26 0 - 44 U/L   Alkaline Phosphatase 115 38 - 126 U/L   Total Bilirubin 0.5 0.3 - 1.2 mg/dL   GFR calc non Af Amer >60 >60 mL/min   GFR calc Af Amer >60 >60 mL/min   Anion gap 8 5 - 15  Lipase, blood  Result Value Ref Range   Lipase 107 (H) 11 - 51 U/L  Urinalysis, Microscopic (reflex)  Result Value Ref Range   RBC / HPF 0-5 0 - 5 RBC/hpf   WBC, UA NONE SEEN 0 - 5 WBC/hpf   Bacteria, UA RARE (A) NONE SEEN   Squamous Epithelial / LPF 0-5 0 - 5   CT ABDOMEN PELVIS W CONTRAST  Result Date: 01/28/2020 CLINICAL DATA:  Initial evaluation for acute upper abdominal pain. EXAM: CT ABDOMEN AND PELVIS WITH CONTRAST TECHNIQUE: Multidetector CT imaging of the abdomen and pelvis was performed using the standard protocol following bolus administration of intravenous contrast. CONTRAST:  1101mL OMNIPAQUE IOHEXOL 300 MG/ML  SOLN COMPARISON:  Prior ultrasound from 06/28/2014. FINDINGS: Lower chest: Mild scattered subsegmental atelectatic changes noted within the visualized lung bases.  Visualized lungs are otherwise clear. Hepatobiliary: Diffuse hypoattenuation liver consistent with steatosis. Liver otherwise unremarkable. Gallbladder within normal limits. No biliary dilatation. Pancreas: Hazy inflammatory stranding noted about the pancreatic head and uncinate process, suggesting acute pancreatitis. No evidence for pancreatic necrosis, loculated peripancreatic collection, or other complication. No abnormal pancreatic ductal dilatation. Spleen: Spleen within normal limits. Adrenals/Urinary Tract: Adrenal glands are normal. Kidneys equal in size with symmetric enhancement. 12 mm simple cyst present at the upper pole the left kidney. Additional subcentimeter hypodensity at the lower left kidney too small the characterize, but suspected to reflect a small cyst as well. No nephrolithiasis, hydronephrosis or focal enhancing renal mass. No hydroureter. Partially distended bladder within normal limits. Stomach/Bowel: Stomach within normal limits. No evidence for bowel obstruction. Normal appendix. Mild colonic diverticulosis without evidence for acute diverticulitis. No acute inflammatory changes seen about the bowels. Vascular/Lymphatic: Normal intravascular enhancement seen throughout the intra-abdominal aorta. Mesenteric vessels patent proximally. Mildly enlarged bilateral external iliac nodes measure up to 13 mm on the right and 15 mm on the left (series 2, image 81), indeterminate. No other pathologically enlarged lymph nodes seen within the abdomen and pelvis. Reproductive: Prostate and seminal vesicles within normal limits. Other: No free air or fluid. Small fat containing paraumbilical hernia noted without associated inflammation. Musculoskeletal: No free air or fluid. Small fat containing paraumbilical hernia noted without associated inflammation. IMPRESSION: 1. Hazy inflammatory stranding about the pancreatic head and uncinate process, suggesting acute pancreatitis. Correlation with serum lipase  recommended. No complication identified. 2. No other acute intra-abdominal or pelvic process identified. 3. Hepatic steatosis. 4. Mild colonic diverticulosis without evidence for acute diverticulitis. 5. Mildly enlarged bilateral external iliac nodes as above, indeterminate, but may be reactive. Electronically Signed   By: Jeannine Boga M.D.   On: 01/28/2020 01:54    Radiology CT ABDOMEN PELVIS W CONTRAST  Result Date: 01/28/2020 CLINICAL DATA:  Initial evaluation for acute upper abdominal pain. EXAM: CT ABDOMEN AND PELVIS WITH CONTRAST TECHNIQUE: Multidetector CT imaging of the abdomen and pelvis was performed using the standard protocol following bolus administration of intravenous contrast. CONTRAST:  142mL OMNIPAQUE IOHEXOL 300 MG/ML  SOLN COMPARISON:  Prior ultrasound from 06/28/2014. FINDINGS: Lower chest: Mild scattered subsegmental atelectatic changes noted within the visualized lung bases. Visualized lungs are otherwise clear. Hepatobiliary: Diffuse hypoattenuation liver consistent with steatosis. Liver otherwise unremarkable. Gallbladder within normal limits. No biliary dilatation. Pancreas: Hazy inflammatory stranding noted about the pancreatic head and uncinate process, suggesting acute pancreatitis. No evidence for pancreatic necrosis, loculated peripancreatic collection, or other complication. No abnormal pancreatic ductal dilatation. Spleen: Spleen within normal limits. Adrenals/Urinary Tract: Adrenal glands are normal. Kidneys equal in size with symmetric enhancement. 12 mm simple cyst present at the upper pole the left kidney. Additional subcentimeter hypodensity  at the lower left kidney too small the characterize, but suspected to reflect a small cyst as well. No nephrolithiasis, hydronephrosis or focal enhancing renal mass. No hydroureter. Partially distended bladder within normal limits. Stomach/Bowel: Stomach within normal limits. No evidence for bowel obstruction. Normal appendix.  Mild colonic diverticulosis without evidence for acute diverticulitis. No acute inflammatory changes seen about the bowels. Vascular/Lymphatic: Normal intravascular enhancement seen throughout the intra-abdominal aorta. Mesenteric vessels patent proximally. Mildly enlarged bilateral external iliac nodes measure up to 13 mm on the right and 15 mm on the left (series 2, image 81), indeterminate. No other pathologically enlarged lymph nodes seen within the abdomen and pelvis. Reproductive: Prostate and seminal vesicles within normal limits. Other: No free air or fluid. Small fat containing paraumbilical hernia noted without associated inflammation. Musculoskeletal: No free air or fluid. Small fat containing paraumbilical hernia noted without associated inflammation. IMPRESSION: 1. Hazy inflammatory stranding about the pancreatic head and uncinate process, suggesting acute pancreatitis. Correlation with serum lipase recommended. No complication identified. 2. No other acute intra-abdominal or pelvic process identified. 3. Hepatic steatosis. 4. Mild colonic diverticulosis without evidence for acute diverticulitis. 5. Mildly enlarged bilateral external iliac nodes as above, indeterminate, but may be reactive. Electronically Signed   By: Rise Mu M.D.   On: 01/28/2020 01:54    Procedures Procedures (including critical care time)  Medications Ordered in ED Medications  alum & mag hydroxide-simeth (MAALOX/MYLANTA) 200-200-20 MG/5ML suspension 30 mL (30 mLs Oral Given 01/28/20 0037)  iohexol (OMNIPAQUE) 300 MG/ML solution 100 mL (100 mLs Intravenous Contrast Given 01/28/20 0114)  ketorolac (TORADOL) 30 MG/ML injection 30 mg (30 mg Intravenous Given 01/28/20 0211)    ED Course  I have reviewed the triage vital signs and the nursing notes.  Pertinent labs & imaging results that were available during my care of the patient were reviewed by me and considered in my medical decision making (see chart for  details).  Patient is resting comfortably in the room. Informed of acute pancreatitis. Patient states he is does not drink alcohol at all. There are no exam nor LFT abnormalities to suggest gall bladder pathology at this time. It may be related to the patient's diabetes medication and I have advised the patient to discuss his meds with his PMD.  With nurse Sharyl Nimrod present, patient refused a prescription for percocet.  EDP offered multiple times and patient stated he did not want it and would not take it.  He was given clear liquid diet instructions and advised to take tylenol every six hours and to return immediately for worsening pain and the patient stated that he would do so.    Michael Foster was evaluated in Emergency Department on 01/28/2020 for the symptoms described in the history of present illness. He was evaluated in the context of the global COVID-19 pandemic, which necessitated consideration that the patient might be at risk for infection with the SARS-CoV-2 virus that causes COVID-19. Institutional protocols and algorithms that pertain to the evaluation of patients at risk for COVID-19 are in a state of rapid change based on information released by regulatory bodies including the CDC and federal and state organizations. These policies and algorithms were followed during the patient's care in the ED.  Final Clinical Impression(s) / ED Diagnoses Final diagnoses:  Acute pancreatitis without infection or necrosis, unspecified pancreatitis type   Return for intractable cough, coughing up blood,fevers >100.4 unrelieved by medication, shortness of breath, intractable vomiting, chest pain, shortness of breath, weakness,numbness, changes in speech,  facial asymmetry,abdominal pain, passing out,Inability to tolerate liquids or food, cough, altered mental status or any concerns. No signs of systemic illness or infection. The patient is nontoxic-appearing on exam and vital signs are within normal  limits.   I have reviewed the triage vital signs and the nursing notes. Pertinent labs &imaging results that were available during my care of the patient were reviewed by me and considered in my medical decision making (see chart for details).After history, exam, and medical workup I feel the patient has beenappropriately medically screened and is safe for discharge home. Pertinent diagnoses were discussed with the patient. Patient was given return precautions.  Rx / DC Orders ED Discharge Orders         Ordered    dicyclomine (BENTYL) 20 MG tablet  2 times daily     01/28/20 0223    omeprazole (PRILOSEC) 20 MG capsule  Daily     01/28/20 0223    ondansetron (ZOFRAN ODT) 8 MG disintegrating tablet     01/28/20 0223           Darby Shadwick, MD 01/28/20 3810

## 2020-01-31 ENCOUNTER — Encounter: Payer: Self-pay | Admitting: Family Medicine

## 2020-03-03 ENCOUNTER — Other Ambulatory Visit: Payer: Self-pay | Admitting: Family Medicine

## 2020-03-03 DIAGNOSIS — J4520 Mild intermittent asthma, uncomplicated: Secondary | ICD-10-CM

## 2020-03-17 ENCOUNTER — Other Ambulatory Visit: Payer: Self-pay | Admitting: Family Medicine

## 2020-04-04 ENCOUNTER — Other Ambulatory Visit: Payer: Self-pay | Admitting: Family Medicine

## 2020-04-04 DIAGNOSIS — F32A Depression, unspecified: Secondary | ICD-10-CM

## 2020-04-28 ENCOUNTER — Other Ambulatory Visit: Payer: Self-pay | Admitting: Family Medicine

## 2020-05-15 ENCOUNTER — Other Ambulatory Visit: Payer: Self-pay | Admitting: Family Medicine

## 2020-05-15 DIAGNOSIS — E1165 Type 2 diabetes mellitus with hyperglycemia: Secondary | ICD-10-CM

## 2020-06-01 ENCOUNTER — Other Ambulatory Visit: Payer: Self-pay

## 2020-06-01 DIAGNOSIS — J4520 Mild intermittent asthma, uncomplicated: Secondary | ICD-10-CM

## 2020-06-01 MED ORDER — FLOVENT HFA 44 MCG/ACT IN AERO: 2.0000 | INHALATION_SPRAY | Freq: Every day | RESPIRATORY_TRACT | 2 refills | Status: DC

## 2020-07-12 ENCOUNTER — Other Ambulatory Visit: Payer: Self-pay | Admitting: Family Medicine

## 2020-08-29 ENCOUNTER — Encounter: Payer: Self-pay | Admitting: Family Medicine

## 2020-08-29 ENCOUNTER — Other Ambulatory Visit: Payer: Self-pay

## 2020-08-29 ENCOUNTER — Telehealth: Payer: Self-pay

## 2020-08-29 ENCOUNTER — Ambulatory Visit (INDEPENDENT_AMBULATORY_CARE_PROVIDER_SITE_OTHER): Payer: 59 | Admitting: Family Medicine

## 2020-08-29 VITALS — BP 140/62 | HR 88 | Ht 69.0 in | Wt 318.0 lb

## 2020-08-29 DIAGNOSIS — E1159 Type 2 diabetes mellitus with other circulatory complications: Secondary | ICD-10-CM | POA: Diagnosis not present

## 2020-08-29 DIAGNOSIS — I10 Essential (primary) hypertension: Secondary | ICD-10-CM

## 2020-08-29 DIAGNOSIS — E1165 Type 2 diabetes mellitus with hyperglycemia: Secondary | ICD-10-CM | POA: Diagnosis not present

## 2020-08-29 DIAGNOSIS — I152 Hypertension secondary to endocrine disorders: Secondary | ICD-10-CM

## 2020-08-29 DIAGNOSIS — J4520 Mild intermittent asthma, uncomplicated: Secondary | ICD-10-CM | POA: Diagnosis not present

## 2020-08-29 DIAGNOSIS — K279 Peptic ulcer, site unspecified, unspecified as acute or chronic, without hemorrhage or perforation: Secondary | ICD-10-CM | POA: Diagnosis not present

## 2020-08-29 DIAGNOSIS — Z7689 Persons encountering health services in other specified circumstances: Secondary | ICD-10-CM

## 2020-08-29 LAB — POCT GLYCOSYLATED HEMOGLOBIN (HGB A1C): Hemoglobin A1C: 14.3 % — AB (ref 4.0–5.6)

## 2020-08-29 MED ORDER — OMEPRAZOLE 20 MG PO CPDR: 20.0000 mg | DELAYED_RELEASE_CAPSULE | Freq: Every day | ORAL | 2 refills | Status: DC

## 2020-08-29 MED ORDER — LISINOPRIL 2.5 MG PO TABS: 2.5000 mg | ORAL_TABLET | Freq: Every day | ORAL | 11 refills | Status: DC

## 2020-08-29 MED ORDER — ONETOUCH VERIO VI STRP: ORAL_STRIP | 12 refills | Status: DC

## 2020-08-29 MED ORDER — ACCU-CHEK GUIDE VI STRP: ORAL_STRIP | 12 refills | Status: DC

## 2020-08-29 MED ORDER — LANTUS SOLOSTAR 100 UNIT/ML ~~LOC~~ SOPN: 30.0000 [IU] | PEN_INJECTOR | Freq: Every day | SUBCUTANEOUS | 1 refills | Status: DC

## 2020-08-29 MED ORDER — ONETOUCH DELICA LANCETS 33G MISC: 1.0000 | Freq: Every day | 3 refills | Status: DC

## 2020-08-29 MED ORDER — ONETOUCH DELICA LANCING DEV MISC: 1.0000 | Freq: Every day | 1 refills | Status: DC

## 2020-08-29 MED ORDER — BD PEN NEEDLE SHORT U/F 31G X 8 MM MISC: 2 refills | Status: DC

## 2020-08-29 MED ORDER — METFORMIN HCL ER 500 MG PO TB24: 1000.0000 mg | ORAL_TABLET | Freq: Two times a day (BID) | ORAL | 3 refills | Status: DC

## 2020-08-29 MED ORDER — ONETOUCH ULTRASOFT LANCETS MISC: 12 refills | Status: DC

## 2020-08-29 MED ORDER — METFORMIN HCL 500 MG PO TABS: 1000.0000 mg | ORAL_TABLET | Freq: Two times a day (BID) | ORAL | 3 refills | Status: DC

## 2020-08-29 MED ORDER — ONETOUCH VERIO FLEX SYSTEM W/DEVICE KIT: 1.0000 [IU] | PACK | Freq: Every day | 0 refills | Status: DC

## 2020-08-29 MED ORDER — ALBUTEROL SULFATE HFA 108 (90 BASE) MCG/ACT IN AERS: 2.0000 | INHALATION_SPRAY | Freq: Four times a day (QID) | RESPIRATORY_TRACT | 1 refills | Status: DC | PRN

## 2020-08-29 NOTE — Assessment & Plan Note (Signed)
Currently poorly controlled although he has run out of his lisinopril. We will recheck his blood pressure when he returns to clinic in 1 month. -Lisinopril refilled -Follow-up BMP

## 2020-08-29 NOTE — Assessment & Plan Note (Signed)
Omeprazole refilled.

## 2020-08-29 NOTE — Patient Instructions (Addendum)
Diabetes: I am glad you come back in for steroid conversation about your diabetes.  Here are some of the things we talked about: -Increase your Lantus to 30 units every night -Check your blood sugar every morning before breakfast and write it down.  Bring this information with you to your next visit. -Increase your Metformin to 1000 mg (2 pills) in the morning and 1000 mg (2 pills) in the evening. -We are going to hold off on starting that other new medication today.  I would like to start in 1 month once your blood sugar is a little bit lower. -I have placed a referral for your prescription for gym membership.  He should get a call in the next 1-2 weeks about getting set up.  Blood pressure: I would like you to continue taking your lisinopril.  I have refilled your lisinopril.  I will take a look at your kidney function today to make sure your kidneys are doing okay.  Please come back in 1 month to check up on your diabetes and start a new medicine.

## 2020-08-29 NOTE — Assessment & Plan Note (Signed)
Breathing comfortably today. No significant changes. -Albuterol refilled

## 2020-08-29 NOTE — Progress Notes (Signed)
    SUBJECTIVE:   CHIEF COMPLAINT / HPI:   Diabetes His current diabetic regimen includes: -Metformin 500 mg twice daily -Semaglutide was discontinued this past May when he presented to the ED with pancreatitis. -Lantus 26 units daily -Lisinopril 2.5 mg daily, he needs a refill -Rosuvastatin 5 mg daily His last A1c from over 1 year ago was 11.4. His A1c today is 14.3. He does not check fasting blood sugar.  He reports that he has had many dietary indiscretions since his last visit and that he has not been taking semaglutide due to an episode of pancreatitis this past May. He does not like to be active and go to the gym regularly although he has not been as consistent with his exercise in the past month or 2.  PERTINENT  PMH / PSH: Pancreatitis in May of this past year possibly from his GLP-1 agonist  OBJECTIVE:   BP 140/62   Pulse 88   Ht 5\' 9"  (1.753 m)   Wt (!) 318 lb (144.2 kg)   SpO2 97%   BMI 46.96 kg/m    General: Seated comfortably in his chair in the exam room. No acute distress. Respiratory: Breathing comfortably on room air. No respiratory distress Diabetic Foot Exam - Simple   Simple Foot Form Diabetic Foot exam was performed with the following findings: Yes 08/29/2020  9:58 AM  Visual Inspection No deformities, no ulcerations, no other skin breakdown bilaterally: Yes Sensation Testing Intact to touch and monofilament testing bilaterally: Yes Pulse Check Posterior Tibialis and Dorsalis pulse intact bilaterally: Yes Comments      ASSESSMENT/PLAN:   Essential hypertension Currently poorly controlled although he has run out of his lisinopril. We will recheck his blood pressure when he returns to clinic in 1 month. -Lisinopril refilled -Follow-up BMP  Diabetes (HCC) Currently poorly controlled with a significantly elevated A1c. We spent time today discussing the importance of this elevated A1c now this is dangerously high. Please see AVS for additional  details about changes to his diabetic regimen which include the following: -Increase Metformin -Increase Lantus -Check fasting glucose daily -Placed referral to the provider referral exercise program -Plan to start SGLT2 inhibitor in 1 month when he returns -Follow-up BMP, lipid panel  Asthma, chronic Breathing comfortably today. No significant changes. -Albuterol refilled  Peptic ulcer disease -Omeprazole refilled     14/03/2020, MD [facility]

## 2020-08-29 NOTE — Assessment & Plan Note (Addendum)
Currently poorly controlled with a significantly elevated A1c. We spent time today discussing the importance of this elevated A1c now this is dangerously high. Please see AVS for additional details about changes to his diabetic regimen which include the following: -Increase Metformin -Increase Lantus -Check fasting glucose daily -Placed referral to the provider referral exercise program -Plan to start SGLT2 inhibitor in 1 month when he returns -Follow-up BMP, lipid panel

## 2020-08-29 NOTE — Telephone Encounter (Signed)
Call placed to pt reference to PREP referral from Dr Homero Fellers Explained program to patient and times of classes His work scheduled currently is 10a-7p working in [employer]. Does have a fitness watch and closes his circles of movement-watch reminds him to move Can't do day time or evening class Will call him when I have earlier class than 10am.  He currently works out around 8am. Was doing well earlier in the year with exercise but got off track when he went on vacation in Aug. Does like to hike and do strength training Encouraged 150 min of exercise every week with atleast 2 strength training days He has seen a dietician in the past for his diabetes.Asked him to log his sugars so he can start to make the connection between diet and his blood sugar. While we are waiting on a class that meets his scheduled, encouraged him to reach out to his ins as they likely have a disease mgmt/diabetes RN who could help him stay on track He is open to me reaching back when I have a class available [facility] He has my number for contact

## 2020-08-29 NOTE — Addendum Note (Signed)
Addended by: Nada Libman on: 08/29/2020 07:50 PM   Modules accepted: Orders

## 2020-08-30 ENCOUNTER — Encounter: Payer: Self-pay | Admitting: Family Medicine

## 2020-08-30 LAB — BASIC METABOLIC PANEL
BUN/Creatinine Ratio: 21 — ABNORMAL HIGH (ref 9–20)
BUN: 16 mg/dL (ref 6–24)
CO2: 20 mmol/L (ref 20–29)
Calcium: 9.9 mg/dL (ref 8.7–10.2)
Chloride: 100 mmol/L (ref 96–106)
Creatinine, Ser: 0.76 mg/dL (ref 0.76–1.27)
GFR calc Af Amer: 130 mL/min/{1.73_m2} (ref >59)
GFR calc non Af Amer: 113 mL/min/{1.73_m2} (ref >59)
Glucose: 386 mg/dL — ABNORMAL HIGH (ref 65–99)
Potassium: 4.4 mmol/L (ref 3.5–5.2)
Sodium: 137 mmol/L (ref 134–144)

## 2020-08-30 LAB — LIPID PANEL
Chol/HDL Ratio: 4.6 ratio (ref 0.0–5.0)
Cholesterol, Total: 147 mg/dL (ref 100–199)
HDL: 32 mg/dL — ABNORMAL LOW (ref >39)
LDL Chol Calc (NIH): 62 mg/dL (ref 0–99)
Triglycerides: 341 mg/dL — ABNORMAL HIGH (ref 0–149)
VLDL Cholesterol Cal: 53 mg/dL — ABNORMAL HIGH (ref 5–40)

## 2020-09-01 ENCOUNTER — Other Ambulatory Visit: Payer: Self-pay | Admitting: Family Medicine

## 2020-09-01 DIAGNOSIS — J4520 Mild intermittent asthma, uncomplicated: Secondary | ICD-10-CM

## 2020-09-28 ENCOUNTER — Other Ambulatory Visit: Payer: Self-pay

## 2020-09-28 ENCOUNTER — Encounter: Payer: Self-pay | Admitting: Family Medicine

## 2020-09-28 ENCOUNTER — Ambulatory Visit (INDEPENDENT_AMBULATORY_CARE_PROVIDER_SITE_OTHER): Payer: 59 | Admitting: Family Medicine

## 2020-09-28 DIAGNOSIS — E1159 Type 2 diabetes mellitus with other circulatory complications: Secondary | ICD-10-CM

## 2020-09-28 DIAGNOSIS — I152 Hypertension secondary to endocrine disorders: Secondary | ICD-10-CM | POA: Diagnosis not present

## 2020-09-28 DIAGNOSIS — E1165 Type 2 diabetes mellitus with hyperglycemia: Secondary | ICD-10-CM | POA: Diagnosis not present

## 2020-09-28 MED ORDER — EMPAGLIFLOZIN 10 MG PO TABS: 10.0000 mg | ORAL_TABLET | Freq: Every day | ORAL | 1 refills | Status: DC

## 2020-09-28 MED ORDER — SEMAGLUTIDE 3 MG PO TABS
3.0000 mg | ORAL_TABLET | Freq: Every day | ORAL | 0 refills | Status: DC
Start: 2020-09-28 — End: 2020-09-28

## 2020-09-28 NOTE — Addendum Note (Signed)
Addended by: Nada Libman on: 09/28/2020 12:08 PM   Modules accepted: Orders

## 2020-09-28 NOTE — Progress Notes (Addendum)
    SUBJECTIVE:   CHIEF COMPLAINT / HPI:   Diabetes His current diabetes regimen includes: -Metformin 1000 mg twice daily -Lantus 30 units daily -Lisinopril 2.5 mg daily -Rosuvastatin 5 mg daily He reported that he had been in the habit of checking his blood sugars after our last visit but then felt sick over Christmas and fell out of the habit.  He noted that his blood sugars were initially higher (around 360) but significantly dropped once he regularly took his Metformin and Lantus as noted above.  More recently, his blood sugars have been around 220.  He has not been checking his blood sugars this past week.  He reports that he has been making a big effort to go to the gym regularly.  We did get him set up with the provider referral for exercise but the class times did not work out with the schedule.  He did note that he has been peeing much less frequently.  His girlfriend also noted that he wakes up at night less often to use the bathroom.  He has previously had issues with dry mouth and frequent urination which have significantly improved since his last visit.   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  Resting comfortably in his chair in the exam room. HEENT: Moist mucous membranes. Cardio: Normal S1 and S2, no S3 or S4. Rhythm is regular. No murmurs or rubs.   Pulm: Clear to auscultation bilaterally, no crackles, wheezing, or diminished breath sounds. Normal respiratory effort Abdomen: Bowel sounds normal. Abdomen soft and non-tender.  Extremities: No peripheral edema. Warm/ well perfused.  Strong radial pulses. Neuro: Cranial nerves grossly intact  ASSESSMENT/PLAN:   Hypertension associated with diabetes (HCC) Mildly elevated today.  I expect  this may improve with the addition of his SGLT2 inhibitor.  We will continue to monitor and consider increasing his lisinopril to next visit.  Diabetes type 2, uncontrolled (HCC) Significantly improved today based on his reported CBGs and symptom improvement. -Continue Metformin 1000 mg twice daily -Continue Lantus 30 units daily -Start jardiance 10 mg daily -Return in 1 month, plan for up titration of semaglutide at that time  Initially, semaglutide was sent into the pharmacy, this is a mistake.  This initial prescription was canceled and Jardiance sent in its place.  Called pharmacy and clarified medication change.     , MD [facility]

## 2020-09-28 NOTE — Assessment & Plan Note (Addendum)
Significantly improved today based on his reported CBGs and symptom improvement. -Continue Metformin 1000 mg twice daily -Continue Lantus 30 units daily -Start jardiance 10 mg daily -Return in 1 month, plan for up titration of semaglutide at that time  Initially, semaglutide was sent into the pharmacy, this is a mistake.  This initial prescription was canceled and Jardiance sent in its place.  Called pharmacy and clarified medication change.

## 2020-09-28 NOTE — Patient Instructions (Signed)
It was great to see you today.  Here is a quick review of the things we talked about:   It was great to see you today Mr. Michael Foster.  I am glad that you are able to tolerate the increased dose of Metformin and Lantus.  Today, we are going to start you on a new medicine called semaglutide.  This medicine helps you pee out additional blood sugar.  It may cause some stomach upset or loose stools.  Please come back to clinic in 1 month to see how you are doing and we will hopefully increase your dose at that time.  Keep up the good work with going to the gym.  Please try to check your blood sugar every morning and write it down so you can bring that information to your next visit.

## 2020-09-28 NOTE — Assessment & Plan Note (Signed)
Mildly elevated today.  I expect this may improve with the addition of his SGLT2 inhibitor.  We will continue to monitor and consider increasing his lisinopril to next visit.

## 2020-10-03 ENCOUNTER — Encounter: Payer: Self-pay | Admitting: Family Medicine

## 2020-10-03 DIAGNOSIS — E1165 Type 2 diabetes mellitus with hyperglycemia: Secondary | ICD-10-CM

## 2020-10-04 ENCOUNTER — Telehealth: Payer: Self-pay

## 2020-10-04 ENCOUNTER — Other Ambulatory Visit: Payer: Self-pay | Admitting: Family Medicine

## 2020-10-04 MED ORDER — AMOXICILLIN-POT CLAVULANATE 875-125 MG PO TABS
1.0000 | ORAL_TABLET | Freq: Two times a day (BID) | ORAL | 0 refills | Status: DC
Start: 2020-10-04 — End: 2021-09-13

## 2020-10-04 NOTE — Progress Notes (Signed)
Michael Foster was called to discuss his recent cat bite.  He notes that he was bit on his hand by his cat yesterday.  He notes that the cat bit him quite deeply and his hand is now swollen.  He is able to use his hand but he notes that he have trouble pulling a trigger for example.  He otherwise feels well.  He was told that Augmentin would be sent to his pharmacy for him and he should start taking that today.  Additionally, an appointment for tomorrow morning was scheduled so that his hand could be assessed in person.

## 2020-10-04 NOTE — Telephone Encounter (Signed)
Patient calls nurse line regarding cat bite. Patient reports that he was bitten yesterday and that area is now swollen and red. Patient reports cleaning area and applying neosporin but is requesting antibiotic.   Spoke with Dr. Homero Fellers regarding patient. Provider advised that he would send in rx.   Veronda Prude, RN

## 2020-10-05 ENCOUNTER — Other Ambulatory Visit: Payer: Self-pay

## 2020-10-05 ENCOUNTER — Ambulatory Visit (INDEPENDENT_AMBULATORY_CARE_PROVIDER_SITE_OTHER): Payer: BC Managed Care – PPO | Admitting: Family Medicine

## 2020-10-05 DIAGNOSIS — W5501XA Bitten by cat, initial encounter: Secondary | ICD-10-CM

## 2020-10-05 DIAGNOSIS — E1159 Type 2 diabetes mellitus with other circulatory complications: Secondary | ICD-10-CM | POA: Diagnosis not present

## 2020-10-05 DIAGNOSIS — I152 Hypertension secondary to endocrine disorders: Secondary | ICD-10-CM

## 2020-10-05 DIAGNOSIS — E1165 Type 2 diabetes mellitus with hyperglycemia: Secondary | ICD-10-CM | POA: Diagnosis not present

## 2020-10-05 NOTE — Assessment & Plan Note (Signed)
We discussed his concerns regarding congenital infections and SGLT2 inhibitors.  He was encouraged to trial the medication for the next week and monitor closely.  We will continue to discuss at his next visit in 1 week.

## 2020-10-05 NOTE — Progress Notes (Signed)
    SUBJECTIVE:   CHIEF COMPLAINT / HPI:   Cat bite Mr. Witucki reports that he was bitten by his cat on his right index finger 2 days ago.  We will do speak over the phone yesterday he noted some swelling of his right index finger in addition to pain with flexion of the finger.  He noted that he did have some impaired range of motion and could not squeeze his finger enough to pull the trigger for instance.  Today in clinic, he notes he was able to take the Augmentin twice yesterday and believes that his finger is already feeling better.  He is able to express some drainage from the wound if he squeezes his finger.  He has not noted any redness around his hand or moving up his arm.  He specifically denies fever, nausea, abdominal pain.  Questions regarding SGLT2 inhibitor He has previously had issues related to yeast infections and irritation of his penis.  He was concerned about Fournier's gangrene which he read about as a side effect of his Jardiance.  He was hoping for further discussion about this today.  PERTINENT  PMH / PSH: Diabetes, obesity  OBJECTIVE:   BP (!) 150/70   Pulse 85   Ht 5\' 9"  (1.753 m)   Wt (!) 315 lb 4 oz (143 kg)   SpO2 97%   BMI 46.55 kg/m   General: Alert and cooperative and appears to be in no acute distress Cardio: Normal S1 and S2, no S3 or S4. Rhythm is regular. No murmurs or rubs.   Pulm: Clear to auscultation bilaterally, no crackles, wheezing, or diminished breath sounds. Normal respiratory effort  Right index finger Inspection: Mild erythema and swelling of the proximal and middle phalanx.  No evidence of erythema of the hand or spreading up the arm.  Puncture marks noted on the volar and dorsal aspect of the middle phalanx. Palpation: Mildly tender to palpation.  Some white, purulent discharge could be expressed from the puncture wound on the volar surface. ROM: Limited range of motion with finger flexion.  Full finger extension was  possible. Neurovascularly intact. No special testing  ASSESSMENT/PLAN:   Cat bite Already improving per patient report.  Some expressible purulence from the wound today.  No clear evidence of abscess formation or poorly controlled infection.  He does have the risk factor of poorly controlled diabetes at present.  We will continue with a 1 week course of Augmentin. -Continue Augmentin for 7 days -Return to clinic in 1 week to ensure improvement -Return to clinic for worsening symptoms  Hypertension associated with diabetes (HCC) Mildly elevated today likely in the setting of his recent cat bite and mild anxiety about a new medication.  We will repeat at next visit in 1 week.  Diabetes type 2, uncontrolled (HCC) We discussed his concerns regarding congenital infections and SGLT2 inhibitors.  He was encouraged to trial the medication for the next week and monitor closely.  We will continue to discuss at his next visit in 1 week.     , MD [facility]

## 2020-10-05 NOTE — Patient Instructions (Signed)
Cat bite: I'm glad your finger is already getting a little bit better.  Please complete the 7 days of antibiotics.  Lets have you come back in 1 week once you are done with antibiotics and we will make sure that its gotten totally better.  Diabetes: I think your hesitance regarding [place] is reasonable.  I would like you to start taking this medicine and will be extra mindful of your risk for infection.  We will make sure that you are tolerating it okay next week.

## 2020-10-05 NOTE — Assessment & Plan Note (Signed)
Already improving per patient report.  Some expressible purulence from the wound today.  No clear evidence of abscess formation or poorly controlled infection.  He does have the risk factor of poorly controlled diabetes at present.  We will continue with a 1 week course of Augmentin. -Continue Augmentin for 7 days -Return to clinic in 1 week to ensure improvement -Return to clinic for worsening symptoms

## 2020-10-05 NOTE — Assessment & Plan Note (Signed)
Mildly elevated today likely in the setting of his recent cat bite and mild anxiety about a new medication.  We will repeat at next visit in 1 week.

## 2020-10-12 ENCOUNTER — Ambulatory Visit: Payer: 59

## 2020-10-19 ENCOUNTER — Ambulatory Visit: Payer: 59 | Admitting: Family Medicine

## 2020-11-01 MED ORDER — LANTUS SOLOSTAR 100 UNIT/ML ~~LOC~~ SOPN: 30.0000 [IU] | PEN_INJECTOR | Freq: Every day | SUBCUTANEOUS | 1 refills | Status: DC

## 2020-11-01 NOTE — Addendum Note (Signed)
Addended by: Nada Libman on: 11/01/2020 05:27 PM   Modules accepted: Orders

## 2020-11-05 ENCOUNTER — Other Ambulatory Visit: Payer: Self-pay | Admitting: Family Medicine

## 2020-11-05 DIAGNOSIS — J4520 Mild intermittent asthma, uncomplicated: Secondary | ICD-10-CM

## 2020-11-06 MED ORDER — TOUJEO MAX SOLOSTAR 300 UNIT/ML ~~LOC~~ SOPN: 30.0000 [IU] | PEN_INJECTOR | Freq: Every day | SUBCUTANEOUS | 1 refills | Status: DC

## 2020-11-06 NOTE — Addendum Note (Signed)
Addended by: Nada Libman on: 11/06/2020 06:54 PM   Modules accepted: Orders

## 2020-11-30 ENCOUNTER — Other Ambulatory Visit: Payer: Self-pay | Admitting: Family Medicine

## 2021-01-01 ENCOUNTER — Telehealth: Payer: Self-pay

## 2021-01-01 NOTE — Telephone Encounter (Signed)
Received fax from pharmacy regarding PA for Lantus. Called pharmacy as patient was changed to [facility], due to insurance coverage.   Pharmacist removed Lantus from system and will fill Toujeo for patient.   Veronda Prude, RN

## 2021-01-03 ENCOUNTER — Other Ambulatory Visit: Payer: Self-pay | Admitting: Family Medicine

## 2021-01-29 ENCOUNTER — Other Ambulatory Visit: Payer: Self-pay

## 2021-01-29 DIAGNOSIS — J4520 Mild intermittent asthma, uncomplicated: Secondary | ICD-10-CM

## 2021-01-29 MED ORDER — ALBUTEROL SULFATE HFA 108 (90 BASE) MCG/ACT IN AERS: 2.0000 | INHALATION_SPRAY | Freq: Four times a day (QID) | RESPIRATORY_TRACT | 2 refills | Status: DC | PRN

## 2021-02-08 ENCOUNTER — Other Ambulatory Visit: Payer: Self-pay

## 2021-02-12 MED ORDER — METFORMIN HCL 500 MG PO TABS: ORAL_TABLET | ORAL | 3 refills | Status: DC

## 2021-04-17 DIAGNOSIS — I10 Essential (primary) hypertension: Secondary | ICD-10-CM | POA: Diagnosis not present

## 2021-04-17 DIAGNOSIS — J45909 Unspecified asthma, uncomplicated: Secondary | ICD-10-CM | POA: Diagnosis not present

## 2021-04-17 DIAGNOSIS — Z794 Long term (current) use of insulin: Secondary | ICD-10-CM | POA: Diagnosis not present

## 2021-04-17 DIAGNOSIS — J36 Peritonsillar abscess: Secondary | ICD-10-CM | POA: Diagnosis not present

## 2021-04-17 DIAGNOSIS — E119 Type 2 diabetes mellitus without complications: Secondary | ICD-10-CM | POA: Diagnosis not present

## 2021-04-17 DIAGNOSIS — R519 Headache, unspecified: Secondary | ICD-10-CM | POA: Diagnosis not present

## 2021-04-17 DIAGNOSIS — Z7984 Long term (current) use of oral hypoglycemic drugs: Secondary | ICD-10-CM | POA: Diagnosis not present

## 2021-04-17 DIAGNOSIS — J029 Acute pharyngitis, unspecified: Secondary | ICD-10-CM | POA: Diagnosis not present

## 2021-04-18 DIAGNOSIS — J36 Peritonsillar abscess: Secondary | ICD-10-CM | POA: Diagnosis not present

## 2021-04-18 DIAGNOSIS — Z6841 Body Mass Index (BMI) 40.0 and over, adult: Secondary | ICD-10-CM | POA: Diagnosis not present

## 2021-05-01 ENCOUNTER — Other Ambulatory Visit: Payer: Self-pay

## 2021-05-01 DIAGNOSIS — F32A Depression, unspecified: Secondary | ICD-10-CM

## 2021-05-03 MED ORDER — ROSUVASTATIN CALCIUM 5 MG PO TABS: 5.0000 mg | ORAL_TABLET | Freq: Every day | ORAL | 1 refills | Status: DC

## 2021-05-03 MED ORDER — SERTRALINE HCL 50 MG PO TABS: ORAL_TABLET | ORAL | 2 refills | Status: DC

## 2021-05-03 NOTE — Telephone Encounter (Signed)
Please have patient come in to talk about depression/symptoms

## 2021-05-31 ENCOUNTER — Other Ambulatory Visit: Payer: Self-pay

## 2021-05-31 DIAGNOSIS — K279 Peptic ulcer, site unspecified, unspecified as acute or chronic, without hemorrhage or perforation: Secondary | ICD-10-CM

## 2021-06-03 MED ORDER — OMEPRAZOLE 20 MG PO CPDR: 20.0000 mg | DELAYED_RELEASE_CAPSULE | Freq: Every day | ORAL | 2 refills | Status: DC

## 2021-06-04 ENCOUNTER — Other Ambulatory Visit: Payer: Self-pay

## 2021-06-05 ENCOUNTER — Encounter: Payer: Self-pay | Admitting: Student

## 2021-06-12 ENCOUNTER — Encounter: Payer: Self-pay | Admitting: Student

## 2021-06-14 ENCOUNTER — Other Ambulatory Visit: Payer: Self-pay | Admitting: Student

## 2021-06-14 ENCOUNTER — Other Ambulatory Visit: Payer: Self-pay | Admitting: Family Medicine

## 2021-06-14 DIAGNOSIS — J4520 Mild intermittent asthma, uncomplicated: Secondary | ICD-10-CM

## 2021-06-14 DIAGNOSIS — E1165 Type 2 diabetes mellitus with hyperglycemia: Secondary | ICD-10-CM

## 2021-06-17 ENCOUNTER — Other Ambulatory Visit: Payer: Self-pay | Admitting: Student

## 2021-06-17 DIAGNOSIS — J4520 Mild intermittent asthma, uncomplicated: Secondary | ICD-10-CM

## 2021-06-17 DIAGNOSIS — E1165 Type 2 diabetes mellitus with hyperglycemia: Secondary | ICD-10-CM

## 2021-06-17 MED ORDER — ALBUTEROL SULFATE HFA 108 (90 BASE) MCG/ACT IN AERS: 2.0000 | INHALATION_SPRAY | Freq: Four times a day (QID) | RESPIRATORY_TRACT | 0 refills | Status: DC | PRN

## 2021-06-17 MED ORDER — TOUJEO MAX SOLOSTAR 300 UNIT/ML ~~LOC~~ SOPN: 30.0000 [IU] | PEN_INJECTOR | Freq: Every day | SUBCUTANEOUS | 0 refills | Status: DC

## 2021-06-17 NOTE — Progress Notes (Signed)
Spoke with Michael Foster with Dr. Lum Babe on Friday 9/23 about medication refills. He will be temporarily in [place] for a job but will be able to come in for an appointment in the office to reassess diabetes/meds reconciliation. Will refill tujeo and albuterol for now to [facility] pharmacy. Sertraline was refilled by his mental health provider.

## 2021-07-11 ENCOUNTER — Other Ambulatory Visit: Payer: Self-pay | Admitting: Student

## 2021-07-11 DIAGNOSIS — J4520 Mild intermittent asthma, uncomplicated: Secondary | ICD-10-CM

## 2021-07-15 ENCOUNTER — Other Ambulatory Visit: Payer: Self-pay | Admitting: Student

## 2021-07-15 DIAGNOSIS — E1165 Type 2 diabetes mellitus with hyperglycemia: Secondary | ICD-10-CM

## 2021-07-18 ENCOUNTER — Telehealth: Payer: Self-pay | Admitting: Student

## 2021-07-18 NOTE — Telephone Encounter (Signed)
Called patient and he will try and schedule appointment for first week of December. No appointments available right now. Advised going to eye doctor as well and coming in as soon as possible for diabetes check.

## 2021-07-18 NOTE — Telephone Encounter (Signed)
Called patient and he will try and schedule appointment for first week of December. No appointments available right now he will call back in November to schedule appointment. Advised going to eye doctor as well and coming into clinic as soon as possible for diabetes check. Refilled Tujeo for now.

## 2021-08-27 ENCOUNTER — Other Ambulatory Visit: Payer: Self-pay

## 2021-08-27 ENCOUNTER — Ambulatory Visit (INDEPENDENT_AMBULATORY_CARE_PROVIDER_SITE_OTHER): Payer: BC Managed Care – PPO | Admitting: Student

## 2021-08-27 ENCOUNTER — Encounter: Payer: Self-pay | Admitting: Student

## 2021-08-27 VITALS — BP 167/81 | HR 84 | Ht 69.0 in | Wt 303.8 lb

## 2021-08-27 DIAGNOSIS — F32A Depression, unspecified: Secondary | ICD-10-CM | POA: Diagnosis not present

## 2021-08-27 DIAGNOSIS — E1165 Type 2 diabetes mellitus with hyperglycemia: Secondary | ICD-10-CM

## 2021-08-27 DIAGNOSIS — I152 Hypertension secondary to endocrine disorders: Secondary | ICD-10-CM

## 2021-08-27 DIAGNOSIS — E1159 Type 2 diabetes mellitus with other circulatory complications: Secondary | ICD-10-CM

## 2021-08-27 LAB — POCT GLYCOSYLATED HEMOGLOBIN (HGB A1C): HbA1c, POC (controlled diabetic range): 13.3 % — AB (ref 0.0–7.0)

## 2021-08-27 MED ORDER — SERTRALINE HCL 50 MG PO TABS: ORAL_TABLET | ORAL | 2 refills | Status: DC

## 2021-08-27 NOTE — Patient Instructions (Addendum)
It was great to see you! Thank you for allowing me to participate in your care!   I recommend that you always bring your medications to each appointment as this makes it easy to ensure we are on the correct medications and helps [place] not miss when refills are needed.  Our plans for today:  - Your A1c was 13.3 today. Please take your blood sugar every morning. Increase your insulin every morning by 2 units until you get morning blood sugars around 150s. You should not be getting blood sugars less than 100 - Please follow up with Rachelle the pharmacist doctor on your diabetes medications -You will need follow up every three months to get your diabetes under control  -I refilled your sertraline 50 mg   We are checking some labs today, I will call you if they are abnormal will send you a MyChart message or a letter if they are normal.  If you do not hear about your labs in the next 2 weeks please let us know.  Take care and seek immediate care sooner if you develop any concerns. Please remember to show up 15 minutes before your scheduled appointment time!  Levin Erp, MD [facility]

## 2021-08-27 NOTE — Assessment & Plan Note (Signed)
BP 167/81 today and 169/90 in October on his checks.  -Continue lisinopril 2.5 mg, consider increasing pending kidney fn -BMP

## 2021-08-27 NOTE — Assessment & Plan Note (Addendum)
13/3 A1c today. -Advised taking blood sugars in morning and titrating up 2 units daily until reaching 150s blood sugars in AM. Advised to watch for hypoglycemia as well -Discussed need for 3 month follow up with getting diabetes under control -[facility] pharmacy virtual visit in 2 weeks to discuss blood sugar control -Patient setting up eye appointment -CBC, BMP, lipid

## 2021-08-27 NOTE — Progress Notes (Signed)
     SUBJECTIVE:   CHIEF COMPLAINT / HPI: T2DM  Diabetic Follow Up: Patient is a 45 y.o. male who present today for diabetic follow up.   Patient endorses difficulties affording medications, difficulties with diet since last A1C, and difficulties with exercise since last A1C Has been expensive insurance covers a good amount though. Gold good rx.  Home medications include: toujeo 32 units daily, metformin 500 in morning 500 at night, did not want to try because of scared of fournier's gangrene jardiance 10 mg daily. Has had pancreatitis in past with GLP.   Most recent A1Cs:  Lab Results  Component Value Date   HGBA1C 13.3 (A) 08/27/2021   HGBA1C 14.3 (A) 08/29/2020   HGBA1C 11.4 (A) 05/13/2019   Last Microalbumin, LDL, Creatinine: Lab Results  Component Value Date   MICROALBUR 7.9 (H) 01/04/2015   LDLCALC 62 08/29/2020   CREATININE 0.76 08/29/2020   Patient does not check blood glucose on a regular basis. Can check has supplies  Patient is not up to date on diabetic eye. Will set it up. Patient is up to date on diabetic foot exam.   Hypertension: Patient is a 45 y.o. male who present today with HTN. 169/90 on October 9th at home has been elevated, has equipment at home to check.  Home medications include: lisinopril 2.5 mg Patient endorses taking these medications as prescribed.  Most recent creatinine trend:  Lab Results  Component Value Date   CREATININE 0.76 08/29/2020   CREATININE 0.73 01/28/2020   CREATININE 0.62 05/08/2018   Patient does not check blood pressure at home.  Patient has not had a BMP in the past 1 year.    Anxiety/Depression Sertraline 50 mg daily helping.    PERTINENT  PMH / PSH: tia/stroke 2015, htn, hypercholesterolemia  OBJECTIVE:   BP (!) 167/81   Pulse 84   Ht 5\' 9"  (1.753 m)   Wt (!) 303 lb 12.8 oz (137.8 kg)   SpO2 99%   BMI 44.86 kg/m   General: Well appearing, NAD, awake, alert, responsive to questions Head: Normocephalic  atraumatic CV: Regular rate and rhythm no murmurs rubs or gallops Respiratory: Clear to ausculation bilaterally, no wheezes rales or crackles, chest rises symmetrically,  no increased work of breathing Abdomen: Soft, non-tender, non-distended, normoactive bowel sounds  Extremities: Moves upper and lower extremities freely, no edema in LE Neuro: No focal deficits Skin: No rashes or lesions visualized  Diabetic Foot Exam - Simple   Simple Foot Form Visual Inspection No deformities, no ulcerations, no other skin breakdown bilaterally: Yes Sensation Testing Intact to touch and monofilament testing bilaterally: Yes Pulse Check Posterior Tibialis and Dorsalis pulse intact bilaterally: Yes Comments      ASSESSMENT/PLAN:   Diabetes type 2, uncontrolled (HCC) 13/3 A1c today. -Advised taking blood sugars in morning and titrating up 2 units daily until reaching 150s blood sugars in AM. Advised to watch for hypoglycemia as well -Discussed need for 3 month follow up with getting diabetes under control -[facility] pharmacy virtual visit in 2 weeks to discuss blood sugar control -Patient setting up eye appointment -CBC, BMP, lipid  Hypertension associated with diabetes (HCC) BP 167/81 today and 169/90 in October on his checks.  -Continue lisinopril 2.5 mg, consider increasing pending kidney fn -BMP  Depression/Anxiety Refilled sertraline 50 mg daily. Well controlled on this.     November, MD [facility]

## 2021-08-27 NOTE — Assessment & Plan Note (Signed)
Refilled sertraline 50 mg daily. Well controlled on this.

## 2021-08-28 ENCOUNTER — Encounter: Payer: Self-pay | Admitting: Student

## 2021-08-28 LAB — BASIC METABOLIC PANEL
BUN/Creatinine Ratio: 25 — ABNORMAL HIGH (ref 9–20)
BUN: 17 mg/dL (ref 6–24)
CO2: 24 mmol/L (ref 20–29)
Calcium: 9.4 mg/dL (ref 8.7–10.2)
Chloride: 101 mmol/L (ref 96–106)
Creatinine, Ser: 0.68 mg/dL — ABNORMAL LOW (ref 0.76–1.27)
Glucose: 292 mg/dL — ABNORMAL HIGH (ref 70–99)
Potassium: 4.8 mmol/L (ref 3.5–5.2)
Sodium: 138 mmol/L (ref 134–144)
eGFR: 119 mL/min/{1.73_m2} (ref >59)

## 2021-08-28 LAB — CBC
Hematocrit: 46.5 % (ref 37.5–51.0)
Hemoglobin: 14.9 g/dL (ref 13.0–17.7)
MCH: 26.4 pg — ABNORMAL LOW (ref 26.6–33.0)
MCHC: 32 g/dL (ref 31.5–35.7)
MCV: 82 fL (ref 79–97)
Platelets: 383 10*3/uL (ref 150–450)
RBC: 5.64 x10E6/uL (ref 4.14–5.80)
RDW: 13.1 % (ref 11.6–15.4)
WBC: 9.9 10*3/uL (ref 3.4–10.8)

## 2021-08-28 LAB — LIPID PANEL
Chol/HDL Ratio: 5.1 ratio — ABNORMAL HIGH (ref 0.0–5.0)
Cholesterol, Total: 164 mg/dL (ref 100–199)
HDL: 32 mg/dL — ABNORMAL LOW (ref >39)
LDL Chol Calc (NIH): 93 mg/dL (ref 0–99)
Triglycerides: 231 mg/dL — ABNORMAL HIGH (ref 0–149)
VLDL Cholesterol Cal: 39 mg/dL (ref 5–40)

## 2021-08-30 ENCOUNTER — Other Ambulatory Visit: Payer: Self-pay | Admitting: Student

## 2021-08-30 DIAGNOSIS — E1165 Type 2 diabetes mellitus with hyperglycemia: Secondary | ICD-10-CM

## 2021-08-30 DIAGNOSIS — F32A Depression, unspecified: Secondary | ICD-10-CM

## 2021-08-30 MED ORDER — TOUJEO MAX SOLOSTAR 300 UNIT/ML ~~LOC~~ SOPN
30.0000 [IU] | PEN_INJECTOR | Freq: Every day | SUBCUTANEOUS | 3 refills | Status: DC
Start: 2021-08-30 — End: 2021-09-13

## 2021-08-30 MED ORDER — SERTRALINE HCL 50 MG PO TABS: ORAL_TABLET | ORAL | 3 refills | Status: DC

## 2021-08-30 NOTE — Progress Notes (Signed)
Patient called about normal kidney function/cbc. Advised 1g fish oil BID for increased triglycerides. Patient informed that medications couldn't afford cost without 3 refills. Refilled tuojeo and sertraline per patient request

## 2021-09-13 ENCOUNTER — Other Ambulatory Visit: Payer: Self-pay | Admitting: Family Medicine

## 2021-09-13 ENCOUNTER — Telehealth (INDEPENDENT_AMBULATORY_CARE_PROVIDER_SITE_OTHER): Payer: BC Managed Care – PPO | Admitting: Pharmacist

## 2021-09-13 ENCOUNTER — Other Ambulatory Visit: Payer: Self-pay

## 2021-09-13 DIAGNOSIS — I10 Essential (primary) hypertension: Secondary | ICD-10-CM | POA: Diagnosis not present

## 2021-09-13 MED ORDER — INSULIN DEGLUDEC 100 UNIT/ML ~~LOC~~ SOPN
35.0000 [IU] | PEN_INJECTOR | Freq: Every day | SUBCUTANEOUS | 2 refills | Status: DC
Start: 2021-09-13 — End: 2021-10-04

## 2021-09-13 MED ORDER — LISINOPRIL 2.5 MG PO TABS: 2.5000 mg | ORAL_TABLET | Freq: Every day | ORAL | 2 refills | Status: DC

## 2021-09-13 NOTE — Progress Notes (Signed)
Subjective:    Patient ID: Michael Foster, male    DOB: 1976-02-02, 45 y.o.   MRN: [id number]  HPI Patient is a 45 y.o. male who presents for diabetes management. He is in good spirits and presents via telehealth. Patient was referred and last seen by Primary Care Provider on 08/27/21.  I connected with  Michael Foster on 09/13/21 by a video enabled telemedicine application and verified that I am speaking with the correct person using two identifiers.   I discussed the limitations of evaluation and management by telemedicine. The patient expressed understanding and agreed to proceed. Patient located at home and provider located in clinic.  Patient reports he has not been checking his blood glucose in the morning. He checked it right before our appointment because he remembered he was supposed to and it was 356 but that is his only reading since last office visit.  Patient reports diabetes was diagnosed in 2006.   Insurance coverage/medication affordability: BCBS  Current diabetes medications include: Toujeo 35 units once daily at night, metformin 500mg  twice daily Current hypertension medications include: lisinoprol 2.5mg  (has not been taking - needs refill) Current hyperlipidemia medications include: rosuvastatin 5mg  Patient states that He is taking his medications as prescribed. Patient reports adherence with medications. He uses a pill organizer to take remember to take his medications.  Do you feel that your medications are working for you?  yes  Have you been experiencing any side effects to the medications prescribed? no  Do you have any problems obtaining medications due to transportation or finances?  no    Patient denies hypoglycemic events. Patient denies polyuria (increased urination).  Patient reports polyphagia (increased appetite).  Patient denies polydipsia (increased thirst).  Patient denies neuropathy (nerve pain). Patient denies visual changes. Patient reports self  foot exams.   Objective:   Labs:   Physical Exam Neurological:     Mental Status: He is alert and oriented to person, place, and time.    Review of Systems  Genitourinary:  Negative for frequency.   Lab Results  Component Value Date   HGBA1C 13.3 (A) 08/27/2021   HGBA1C 14.3 (A) 08/29/2020   HGBA1C 11.4 (A) 05/13/2019    Lab Results  Component Value Date   MICRALBCREAT 135.2 (H) 01/17/2010    Lipid Panel     Component Value Date/Time   CHOL 164 08/27/2021 1656   TRIG 231 (H) 08/27/2021 1656   HDL 32 (L) 08/27/2021 1656   CHOLHDL 5.1 (H) 08/27/2021 1656   CHOLHDL 5.3 01/04/2015 1111   VLDL 24 01/04/2015 1111   LDLCALC 93 08/27/2021 1656   LDLDIRECT 64.5 01/17/2010 0000    Clinical Atherosclerotic Cardiovascular Disease (ASCVD): Yes  The 10-year ASCVD risk score (Arnett DK, et al., 2019) is: 7.2%   Values used to calculate the score:     Age: 34 years     Sex: Male     Is Non-Hispanic African American: No     Diabetic: Yes     Tobacco smoker: No     Systolic Blood Pressure: 167 mmHg     Is BP treated: Yes     HDL Cholesterol: 32 mg/dL     Total Cholesterol: 164 mg/dL    Assessment/Plan:   2020 is not controlled likely due to sub-optimal lifestyle. Medication adherence appears optimal. Additional pharmacotherapy is not warranted at this time but will consider adding on SGLT2 in future when patient's blood glucose readings are more controlled. Patient with a history of  intolerance to Ozempic, had pancreatitis. Will increase metformin from 1000mg  daily to 1500mg  and eventually 2000mg  daily. Patient will also begin checking blood glucose daily and titrating Toujeo up by 1 unit if fasting blood glucose >150 to a max of 45 units until our next appointment. Will attempt to switch Toujeo to if approved by insurance due to longer duration of action. Following instruction patient verbalized understanding of treatment plan.    Discontinued basal insulin Toujeo and  switched to 35 units once daily. Patient will continue to titrate 1 unit every day if fasting blood sugar > 150mg /dl until max of 45 units once daily Increased metformin 500mg  twice daily to 500mg  in the AM and 1000mg  in the PM.  Extensively discussed pathophysiology of diabetes, dietary effects on blood sugar control, and recommended lifestyle interventions,  Counseled on s/sx of and management of hypoglycemia Next A1C anticipated March 2023.   Follow-up appointment 3 weeks to review sugar readings. Written patient instructions provided.  This appointment required 27 minutes of non-face-to-face patient care.  Thank you for involving pharmacy to assist in providing this patient's care.

## 2021-09-13 NOTE — Assessment & Plan Note (Signed)
T2DM is not controlled likely due to sub-optimal lifestyle. Medication adherence appears optimal. Additional pharmacotherapy is not warranted at this time but will consider adding on SGLT2 in future when patient's blood glucose readings are more controlled. Patient with a history of intolerance to Ozempic, had pancreatitis. Will increase metformin from 1000mg  daily to 1500mg  and eventually 2000mg  daily. Patient will also begin checking blood glucose daily and titrating Toujeo up by 1 unit if fasting blood glucose >150 to a max of 45 units until our next appointment. Will attempt to switch Toujeo to if approved by insurance due to longer duration of action. Following instruction patient verbalized understanding of treatment plan.    1. Discontinued basal insulin Toujeo and switched to 35 units once daily. Patient will continue to titrate 1 unit every day if fasting blood sugar > 150mg /dl until max of 45 units once daily 2. Increased metformin 500mg  twice daily to 500mg  in the AM and 1000mg  in the PM.  3. Extensively discussed pathophysiology of diabetes, dietary effects on blood sugar control, and recommended lifestyle interventions,  4. Counseled on s/sx of and management of hypoglycemia 5. Next A1C anticipated March 2023.   Follow-up appointment 3 weeks to review sugar readings. Written patient instructions provided.

## 2021-09-14 ENCOUNTER — Other Ambulatory Visit: Payer: Self-pay

## 2021-09-14 DIAGNOSIS — I10 Essential (primary) hypertension: Secondary | ICD-10-CM

## 2021-09-20 ENCOUNTER — Other Ambulatory Visit: Payer: Self-pay | Admitting: Student

## 2021-09-20 DIAGNOSIS — J4520 Mild intermittent asthma, uncomplicated: Secondary | ICD-10-CM

## 2021-09-20 DIAGNOSIS — I10 Essential (primary) hypertension: Secondary | ICD-10-CM

## 2021-09-20 MED ORDER — ALBUTEROL SULFATE HFA 108 (90 BASE) MCG/ACT IN AERS: INHALATION_SPRAY | RESPIRATORY_TRACT | 1 refills | Status: DC

## 2021-09-20 MED ORDER — LISINOPRIL 2.5 MG PO TABS: 2.5000 mg | ORAL_TABLET | Freq: Every day | ORAL | 0 refills | Status: DC

## 2021-09-25 ENCOUNTER — Other Ambulatory Visit: Payer: Self-pay

## 2021-09-27 ENCOUNTER — Other Ambulatory Visit: Payer: Self-pay | Admitting: *Deleted

## 2021-10-01 ENCOUNTER — Encounter: Payer: Self-pay | Admitting: Student

## 2021-10-04 ENCOUNTER — Telehealth (INDEPENDENT_AMBULATORY_CARE_PROVIDER_SITE_OTHER): Payer: BC Managed Care – PPO | Admitting: Pharmacist

## 2021-10-04 ENCOUNTER — Other Ambulatory Visit: Payer: Self-pay

## 2021-10-04 DIAGNOSIS — E1165 Type 2 diabetes mellitus with hyperglycemia: Secondary | ICD-10-CM | POA: Diagnosis not present

## 2021-10-04 MED ORDER — BD PEN NEEDLE SHORT U/F 31G X 8 MM MISC
2 refills | Status: DC
Start: 2021-10-04 — End: 2022-03-27

## 2021-10-04 MED ORDER — INSULIN DEGLUDEC 100 UNIT/ML ~~LOC~~ SOPN: 40.0000 [IU] | PEN_INJECTOR | Freq: Every day | SUBCUTANEOUS | 2 refills | Status: DC

## 2021-10-04 NOTE — Progress Notes (Signed)
Subjective:    Patient ID: Michael Foster, male    DOB: 11-02-1975, 46 y.o.   MRN: [id number]  HPI Patient is a 46 y.o. male who presents for diabetes management. He is in good spirits and presents via telehealth. Patient was referred and last seen by Primary Care Provider on 08/27/21.  I connected with  Michael Foster on 10/04/21 by a video enabled telemedicine application and verified that I am speaking with the correct person using two identifiers.   I discussed the limitations of evaluation and management by telemedicine. The patient expressed understanding and agreed to proceed. Patient located at home and provider located in clinic.  Patient reports he has not been checking his blood glucose in the morning. He checked it right before our appointment because he remembered he was supposed to and it was 356 but that is his only reading since last office visit.  Patient reports diabetes was diagnosed in 2006.   Insurance coverage/medication affordability: BCBS  Current diabetes medications include: Tresiba 35 units once daily in the morning, metformin 1000mg  twice daily Current hypertension medications include: lisinoprol 2.5mg   Current hyperlipidemia medications include: rosuvastatin 5mg  Patient states that He is taking his medications as prescribed. Patient reports adherence with medications. He uses a pill organizer to take remember to take his medications.  Do you feel that your medications are working for you?  yes  Have you been experiencing any side effects to the medications prescribed? no  Do you have any problems obtaining medications due to transportation or finances?  no    Patient denies hypoglycemic events. Patient reports polyuria (increased urination).  Patient reports polyphagia (increased appetite).  Patient denies polydipsia (increased thirst).  Patient denies neuropathy (nerve pain). Patient denies visual changes. Patient reports self foot exams.   Patient  reported dietary habits:  Eats 2 meals/day and 1 snacks/day Breakfast: skips Lunch: sandwiches; bojangles steak, egg + cheese biscuits w/ fries; ground beef + rice; beef stew Dinner: same as options as lunch Snacks: corn chips Drinks: 1/2 sweet 1/2 unsweet tea, diet mountain dew, water   Objective:   Home glucose readings: 12/30: 311 fasting 12/31: 420, 427, 322 (after eating donut) 1/1: 336 (fasting) 1/2: 275 1/3: 280 1/4: 287 1/7: 254 1/9: 268 1/11: 301  Labs:   Physical Exam Neurological:     Mental Status: He is alert and oriented to person, place, and time.    Review of Systems  Genitourinary:  Negative for frequency.   Lab Results  Component Value Date   HGBA1C 13.3 (A) 08/27/2021   HGBA1C 14.3 (A) 08/29/2020   HGBA1C 11.4 (A) 05/13/2019    Lab Results  Component Value Date   MICRALBCREAT 135.2 (H) 01/17/2010    Lipid Panel     Component Value Date/Time   CHOL 164 08/27/2021 1656   TRIG 231 (H) 08/27/2021 1656   HDL 32 (L) 08/27/2021 1656   CHOLHDL 5.1 (H) 08/27/2021 1656   CHOLHDL 5.3 01/04/2015 1111   VLDL 24 01/04/2015 1111   LDLCALC 93 08/27/2021 1656   LDLDIRECT 64.5 01/17/2010 0000    Clinical Atherosclerotic Cardiovascular Disease (ASCVD): Yes  The 10-year ASCVD risk score (Arnett DK, et al., 2019) is: 7.2%   Values used to calculate the score:     Age: 29 years     Sex: Male     Is Non-Hispanic African American: No     Diabetic: Yes     Tobacco smoker: No     Systolic Blood Pressure:  167 mmHg     Is BP treated: Yes     HDL Cholesterol: 32 mg/dL     Total Cholesterol: 164 mg/dL    Assessment/Plan:   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 Medication adherence appears optimal. Additional pharmacotherapy is not warranted at this time but will consider adding on SGLT2 in future when patient's blood glucose readings are more controlled. Patient with a history of intolerance to Ozempic, had pancreatitis.Patient  tolerating max dose metformin well. Patient did not titrate up insulin as instructed therefore reminded patient and will work towards titration. Will continue to attempt to switch Toujeo to [place] if approved by insurance due to longer duration of action. Following instruction patient verbalized understanding of treatment plan.    Discontinued basal insulin Toujeo 35 units and switched to [place] and increased dose to 40 units once daily Continued metformin 1000mg  twice daily Extensively discussed pathophysiology of diabetes, dietary effects on blood sugar control, and recommended lifestyle interventions. Work towards exercising at least 150 minutes a week Counseled on s/sx of and management of hypoglycemia Next A1C anticipated March 2023.   Follow-up appointment 3 weeks to review sugar readings. Written patient instructions provided.  This appointment required 33 minutes of non-face-to-face patient care.  Thank you for involving pharmacy to assist in providing this patient's care.

## 2021-10-05 MED ORDER — METFORMIN HCL 500 MG PO TABS: ORAL_TABLET | ORAL | 3 refills | Status: DC

## 2021-10-15 NOTE — Assessment & Plan Note (Signed)
T2DM is not controlled likely due to sub-optimal lifestyle. Medication adherence appears optimal. Additional pharmacotherapy is not warranted at this time but will consider adding on SGLT2 in future when patient's blood glucose readings are more controlled. Patient with a history of intolerance to Ozempic, had pancreatitis.Patient tolerating max dose metformin well. Patient did not titrate up insulin as instructed therefore reminded patient and will work towards titration. Will continue to attempt to switch Toujeo to [place] if approved by insurance due to longer duration of action. Following instruction patient verbalized understanding of treatment plan.    1. Discontinued basal insulin Toujeo 35 units and switched to [place] and increased dose to 40 units once daily 2. Continued metformin 1000mg  twice daily 3. Extensively discussed pathophysiology of diabetes, dietary effects on blood sugar control, and recommended lifestyle interventions. 4. Work towards exercising at least 150 minutes a week 5. Counseled on s/sx of and management of hypoglycemia 6. Next A1C anticipated March 2023.

## 2021-10-18 ENCOUNTER — Telehealth (INDEPENDENT_AMBULATORY_CARE_PROVIDER_SITE_OTHER): Payer: BC Managed Care – PPO | Admitting: Pharmacist

## 2021-10-18 ENCOUNTER — Other Ambulatory Visit: Payer: Self-pay

## 2021-10-18 DIAGNOSIS — E1165 Type 2 diabetes mellitus with hyperglycemia: Secondary | ICD-10-CM | POA: Diagnosis not present

## 2021-10-18 MED ORDER — METFORMIN HCL 500 MG PO TABS: 1000.0000 mg | ORAL_TABLET | Freq: Two times a day (BID) | ORAL | 2 refills | Status: DC

## 2021-10-18 MED ORDER — INSULIN ASPART (W/NIACINAMIDE) 100 UNIT/ML ~~LOC~~ SOPN: 4.0000 [IU] | PEN_INJECTOR | Freq: Every day | SUBCUTANEOUS | 0 refills | Status: DC

## 2021-10-18 NOTE — Progress Notes (Signed)
Subjective:    Patient ID: Michael Foster, male    DOB: 14-Dec-1975, 46 y.o.   MRN: [id number]  HPI Patient is a 46 y.o. male who presents for diabetes management. He is in good spirits and presents via telehealth. Patient was referred and last seen by Primary Care Provider on 08/27/21. Last seen in pharmacy clinic on 10/04/21.  I connected with  Michael Foster on 10/18/21 by a video enabled telemedicine application and verified that I am speaking with the correct person using two identifiers.   I discussed the limitations of evaluation and management by telemedicine. The patient expressed understanding and agreed to proceed. Patient located at home and provider located in clinic.  Patient reports diabetes was diagnosed in 2006.   Insurance coverage/medication affordability: BCBS  Current diabetes medications include: Toujeo 40 units once daily in the morning, metformin 1000mg  twice daily Current hypertension medications include: lisinoprol 2.5mg   Current hyperlipidemia medications include: rosuvastatin 5mg  Patient states that He is taking his medications as prescribed. Patient reports adherence with medications. He uses a pill organizer to take remember to take his medications.  Do you feel that your medications are working for you?  yes  Have you been experiencing any side effects to the medications prescribed? no  Do you have any problems obtaining medications due to transportation or finances?  no    Patient denies hypoglycemic events. Patient reports polyuria (increased urination).  Patient reports polyphagia (increased appetite).  Patient denies polydipsia (increased thirst).  Patient denies neuropathy (nerve pain). Patient denies visual changes. Patient reports self foot exams.   Patient reported dietary habits:  Eats 2 meals/day and 1 snacks/day Breakfast: skips Lunch: sandwiches; bojangles steak, egg + cheese biscuits w/ fries; ground beef + rice; beef stew Dinner: patient  reports decreasing carbs and having a salad with dinner w/ protein Snacks: corn chips Drinks: 1/2 sweet 1/2 unsweet tea, diet mountain dew, water   Objective:   Home fasting glucose readings: 1/16 281 1/19 257 1/20 295 1/23 212 1/24 215 1/25 233  Labs:   Physical Exam Neurological:     Mental Status: He is alert and oriented to person, place, and time.    Review of Systems  Genitourinary:  Negative for frequency.   Lab Results  Component Value Date   HGBA1C 13.3 (A) 08/27/2021   HGBA1C 14.3 (A) 08/29/2020   HGBA1C 11.4 (A) 05/13/2019    Lab Results  Component Value Date   MICRALBCREAT 135.2 (H) 01/17/2010    Lipid Panel     Component Value Date/Time   CHOL 164 08/27/2021 1656   TRIG 231 (H) 08/27/2021 1656   HDL 32 (L) 08/27/2021 1656   CHOLHDL 5.1 (H) 08/27/2021 1656   CHOLHDL 5.3 01/04/2015 1111   VLDL 24 01/04/2015 1111   LDLCALC 93 08/27/2021 1656   LDLDIRECT 64.5 01/17/2010 0000    Clinical Atherosclerotic Cardiovascular Disease (ASCVD): Yes  The 10-year ASCVD risk score (Arnett DK, et al., 2019) is: 7.2%   Values used to calculate the score:     Age: 30 years     Sex: Male     Is Non-Hispanic African American: No     Diabetic: Yes     Tobacco smoker: No     Systolic Blood Pressure: A999333 mmHg     Is BP treated: Yes     HDL Cholesterol: 32 mg/dL     Total Cholesterol: 164 mg/dL    Assessment/Plan:   T2DM is not controlled likely due to sub-optimal  lifestyle. Medication adherence appears optimal. Additional pharmacotherapy is warranted at this time as patient continues to have hyperglycemia. Will add mealtime coverage as concerns this is likely leading to continued elevation in glucose throughout daytime. Patient's largest carbohydrate meal is lunch therefore will add 4 units (10% of basal dose). Will also continue to consider adding on SGLT2 in future when patient's blood glucose readings are more controlled. Patient with a history of intolerance to  Ozempic, had pancreatitis.Patient tolerating max dose metformin well. Will continue to attempt to switch Toujeo to [place] if approved by insurance due to longer duration of action. Believe patient would be a good candidate for CGM but he is not interested at this time. Following instruction patient verbalized understanding of treatment plan.    Continued basal insulin Toujeo 40 units and switch to [place] if able to obtain Start rapid insulin aspart (Fiasp) 4 units once daily with lunch Continued metformin 1000mg  twice daily Extensively discussed pathophysiology of diabetes, dietary effects on blood sugar control, and recommended lifestyle interventions. Work towards exercising at least 150 minutes a week Counseled on s/sx of and management of hypoglycemia Next A1C anticipated March 2023.   Follow-up appointment 2 weeks to review sugar readings. Written patient instructions provided.  This appointment required 17 minutes of non-face-to-face patient care.  Thank you for involving pharmacy to assist in providing this patient's care.

## 2021-10-18 NOTE — Assessment & Plan Note (Signed)
T2DM is not controlled likely due to sub-optimal lifestyle. Medication adherence appears optimal. Additional pharmacotherapy is warranted at this time as patient continues to have hyperglycemia. Will add mealtime coverage as concerns this is likely leading to continued elevation in glucose throughout daytime. Patient's largest carbohydrate meal is lunch therefore will add 4 units (10% of basal dose). Will also continue to consider adding on SGLT2 in future when patient's blood glucose readings are more controlled. Patient with a history of intolerance to Ozempic, had pancreatitis.Patient tolerating max dose metformin well. Will continue to attempt to switch Toujeo to [place] if approved by insurance due to longer duration of action. Believe patient would be a good candidate for CGM but he is not interested at this time. Following instruction patient verbalized understanding of treatment plan.    1. Continued basal insulin Toujeo 40 units and switch to [place] if able to obtain 2. Start rapid insulin aspart (Fiasp) 4 units once daily with lunch 3. Continued metformin 1000mg  twice daily 4. Extensively discussed pathophysiology of diabetes, dietary effects on blood sugar control, and recommended lifestyle interventions. 5. Work towards exercising at least 150 minutes a week 6. Counseled on s/sx of and management of hypoglycemia 7. Next A1C anticipated March 2023.   Follow-up appointment 2 weeks to review sugar readings.

## 2021-10-18 NOTE — Patient Instructions (Signed)
Mr. Pierre it was a pleasure seeing you today.   Please do the following:  We are adding on 4 units of Fiasp to take right before you eat lunch as directed today during your appointment. If you have any questions or if you believe something has occurred because of this change, call me or your doctor to let one of [place] know.  Continue checking blood sugars at home. It's really important that you record these and bring these in to your next doctor's appointment.  Continue making the lifestyle changes we've discussed together during our visit. Diet and exercise play a significant role in improving your blood sugars.  Follow-up with me in two weeks   Hypoglycemia or low blood sugar:   Low blood sugar can happen quickly and may become an emergency if not treated right away.   While this shouldn't happen often, it can be brought upon if you skip a meal or do not eat enough. Also, if your insulin or other diabetes medications are dosed too high, this can cause your blood sugar to go to low.   Warning signs of low blood sugar include: Feeling shaky or dizzy Feeling weak or tired  Excessive hunger Feeling anxious or upset  Sweating even when you aren't exercising  What to do if I experience low blood sugar? Follow the Rule of 15 Check your blood sugar with your meter. If lower than 70, proceed to step 2.  Treat with 15 grams of fast acting carbs which is found in 3-4 glucose tablets. If none are available you can try hard candy, 1 tablespoon of sugar or honey,4 ounces of fruit juice, or 6 ounces of REGULAR soda.  Re-check your sugar in 15 minutes. If it is still below 70, do what you did in step 2 again. If your blood sugar has come back up, go ahead and eat a snack or small meal made up of complex carbs (ex. Whole grains) and protein at this time to avoid recurrence of low blood sugar.

## 2021-10-31 ENCOUNTER — Other Ambulatory Visit: Payer: Self-pay

## 2021-10-31 ENCOUNTER — Telehealth (INDEPENDENT_AMBULATORY_CARE_PROVIDER_SITE_OTHER): Payer: BC Managed Care – PPO | Admitting: Pharmacist

## 2021-10-31 NOTE — Progress Notes (Signed)
Subjective:    Patient ID: Michael Foster, male    DOB: [birth year], 46 y.o.   MRN: [id number]  HPI Patient is a 46 y.o. male who presents for diabetes management. He is in good spirits and presents via telehealth. Patient was referred and last seen by Primary Care Provider on 08/27/21. Last seen in pharmacy clinic on 10/18/21.  I connected with  Michael Foster on 10/31/21 by a video enabled telemedicine application and verified that I am speaking with the correct person using two identifiers.   I discussed the limitations of evaluation and management by telemedicine. The patient expressed understanding and agreed to proceed. Patient located at home and provider located in clinic.  Patient reports diabetes was diagnosed in 2006.   Insurance coverage/medication affordability: BCBS  Current diabetes medications include: Toujeo 40 units once daily in the morning, metformin 1000mg  twice daily Current hypertension medications include: lisinoprol 2.5mg   Current hyperlipidemia medications include: rosuvastatin 5mg  Patient states that He is taking his medications as prescribed. Patient reports adherence with medications. He uses a pill organizer to take remember to take his medications.  Do you feel that your medications are working for you?  yes  Have you been experiencing any side effects to the medications prescribed? no  Do you have any problems obtaining medications due to transportation or finances?  no    Patient denies hypoglycemic events. Patient reports polyuria (increased urination).  Patient reports polyphagia (increased appetite).  Patient denies polydipsia (increased thirst).  Patient denies neuropathy (nerve pain). Patient denies visual changes. Patient reports self foot exams.   Patient reported dietary habits:  Eats 2 meals/day and 1 snacks/day Breakfast: skips Lunch: sandwiches; bojangles steak, egg + cheese biscuits w/ fries; ground beef + rice; beef stew Dinner: patient  reports decreasing carbs and having a salad with dinner w/ protein Snacks: corn chips Drinks: 1/2 sweet 1/2 unsweet tea, diet mountain dew, water   Objective:   Home fasting glucose readings: 1/16 281 1/19 257 1/20 295 1/23 212 1/24 215 1/25 233  Labs:   Physical Exam Neurological:     Mental Status: He is alert and oriented to person, place, and time.    Review of Systems  Genitourinary:  Negative for frequency.   Lab Results  Component Value Date   HGBA1C 13.3 (A) 08/27/2021   HGBA1C 14.3 (A) 08/29/2020   HGBA1C 11.4 (A) 05/13/2019    Lab Results  Component Value Date   MICRALBCREAT 135.2 (H) 01/17/2010    Lipid Panel     Component Value Date/Time   CHOL 164 08/27/2021 1656   TRIG 231 (H) 08/27/2021 1656   HDL 32 (L) 08/27/2021 1656   CHOLHDL 5.1 (H) 08/27/2021 1656   CHOLHDL 5.3 01/04/2015 1111   VLDL 24 01/04/2015 1111   LDLCALC 93 08/27/2021 1656   LDLDIRECT 64.5 01/17/2010 0000    Clinical Atherosclerotic Cardiovascular Disease (ASCVD): Yes  The 10-year ASCVD risk score (Arnett DK, et al., 2019) is: 7.2%   Values used to calculate the score:     Age: 77 years     Sex: Male     Is Non-Hispanic African American: No     Diabetic: Yes     Tobacco smoker: No     Systolic Blood Pressure: A999333 mmHg     Is BP treated: Yes     HDL Cholesterol: 32 mg/dL     Total Cholesterol: 164 mg/dL    Assessment/Plan:   T2DM is not controlled likely due to sub-optimal  lifestyle. Medication adherence appears optimal. Additional pharmacotherapy is warranted at this time as patient continues to have hyperglycemia. Will add mealtime coverage as concerns this is likely leading to continued elevation in glucose throughout daytime. Patient's largest carbohydrate meal is lunch therefore will add 4 units (10% of basal dose). Will also continue to consider adding on SGLT2 in future when patient's blood glucose readings are more controlled. Patient with a history of intolerance to  Ozempic, had pancreatitis. Patient tolerating max dose metformin well. Will continue to attempt to switch Toujeo to [place] if approved by insurance due to longer duration of action. Believe patient would be a good candidate for CGM but he is not interested at this time. Following instruction patient verbalized understanding of treatment plan.    Continued basal insulin Toujeo 40 units and switch to [place] if able to obtain Start rapid insulin aspart (Fiasp) 4 units once daily with lunch Continued metformin 1000mg  twice daily Extensively discussed pathophysiology of diabetes, dietary effects on blood sugar control, and recommended lifestyle interventions. Work towards exercising at least 150 minutes a week Counseled on s/sx of and management of hypoglycemia Next A1C anticipated March 2023.   Follow-up appointment 2 weeks to review sugar readings. Written patient instructions provided.  This appointment required 17 minutes of non-face-to-face patient care.  Thank you for involving pharmacy to assist in providing this patient's care.

## 2021-11-15 ENCOUNTER — Other Ambulatory Visit: Payer: Self-pay | Admitting: Student

## 2021-11-15 DIAGNOSIS — J4520 Mild intermittent asthma, uncomplicated: Secondary | ICD-10-CM

## 2021-11-20 ENCOUNTER — Other Ambulatory Visit: Payer: Self-pay | Admitting: Student

## 2022-01-03 ENCOUNTER — Other Ambulatory Visit: Payer: Self-pay | Admitting: Student

## 2022-01-03 DIAGNOSIS — I10 Essential (primary) hypertension: Secondary | ICD-10-CM

## 2022-01-03 DIAGNOSIS — E1159 Type 2 diabetes mellitus with other circulatory complications: Secondary | ICD-10-CM

## 2022-01-03 NOTE — Telephone Encounter (Signed)
Mentioned in last note to consider increase in Lisinopril dosage. Kidney function wnl. Will have him come in for nurse visit if able to assess blood pressure.  ?

## 2022-01-04 ENCOUNTER — Ambulatory Visit: Payer: BC Managed Care – PPO

## 2022-01-04 DIAGNOSIS — E1159 Type 2 diabetes mellitus with other circulatory complications: Secondary | ICD-10-CM

## 2022-01-04 NOTE — Progress Notes (Signed)
Patient here today for BP check.     ? ?Last BP was on 167/81 on 08/27/2022. ? ?BP today is 132/66 with a pulse of 73.   ? ?Checked BP in left arm with large cuff.   ? ?Symptoms present: None.  ? ?Patient last took BP med Lisinopril 2.5mg  last night.   ? ?Routed note to PCP.     ? ? ? ? ? ?    ?

## 2022-01-30 ENCOUNTER — Encounter: Payer: Self-pay | Admitting: Student

## 2022-01-30 ENCOUNTER — Other Ambulatory Visit: Payer: Self-pay | Admitting: Student

## 2022-01-30 DIAGNOSIS — J4520 Mild intermittent asthma, uncomplicated: Secondary | ICD-10-CM

## 2022-02-06 ENCOUNTER — Other Ambulatory Visit (HOSPITAL_COMMUNITY): Payer: Self-pay

## 2022-02-06 ENCOUNTER — Encounter: Payer: Self-pay | Admitting: Student

## 2022-02-08 ENCOUNTER — Telehealth: Payer: Self-pay | Admitting: Pharmacist

## 2022-02-08 DIAGNOSIS — E1165 Type 2 diabetes mellitus with hyperglycemia: Secondary | ICD-10-CM

## 2022-02-08 MED ORDER — DEXCOM G7 SENSOR MISC: 3 refills | Status: DC

## 2022-02-08 NOTE — Telephone Encounter (Signed)
-----   Message from Shona Simpson, CPhT sent at 02/08/2022 12:58 PM EDT ----- Regarding: RE: CGM Yes, I believe that would work. ----- Message ----- From: Kathrin Ruddy, RPH-CPP Sent: 02/08/2022  11:57 AM EDT To: Shona Simpson, CPhT Subject: RE: CGM                                        Let's proceed with Dexcom.    Need a prescription for reader, sensor and transmitter sent someplace? To his pharmacy?   ----- Message ----- From: Shona Simpson, CPhT Sent: 02/06/2022  11:17 AM EDT To: Kathrin Ruddy, RPH-CPP, Levin Erp, MD Subject: RE: CGM                                        Both are covered on his commercial insurance. Dexcom looks to be cheaper. ----- Message ----- From: Kathrin Ruddy, RPH-CPP Sent: 02/05/2022   3:34 PM EDT To: Shona Simpson, CPhT, Levin Erp, MD Subject: CGM                                            Michael Foster,   Can you please assist with determining preferred CGM (dexcom vs libre) for this patient?  Please start application process for coverage ([place])   Let me know what you need from me - prescription sent somewhere.  Dr. Laroy Apple - I think he should be covered.  He is taking multiple shots per day.    ----- Message ----- From: Levin Erp, MD Sent: 02/01/2022   3:22 AM EDT To: Kathrin Ruddy, RPH-CPP  Hi Dr. Raymondo Band,  This patient is interested in a CGM and I was wondering if this would be a possibility for him?  Mayuri

## 2022-02-08 NOTE — Telephone Encounter (Signed)
Phone call to patient and we discussed the CGM set-up  I shared that I would be sending him the Dexcom G7 to his pharmacy.   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   I shared my phone - direct line with him to help if he had any questions.  He plans to use overlays to help secure the sensors.    He has an I-phone 12 so the Dexcom G7 should be appropriate.  Patient verbalized understanding and felt comfortable with starting on his own.

## 2022-02-11 NOTE — Telephone Encounter (Signed)
Noted and agree. 

## 2022-02-26 ENCOUNTER — Encounter: Payer: Self-pay | Admitting: *Deleted

## 2022-03-21 ENCOUNTER — Other Ambulatory Visit: Payer: Self-pay

## 2022-03-21 DIAGNOSIS — E1159 Type 2 diabetes mellitus with other circulatory complications: Secondary | ICD-10-CM

## 2022-03-21 MED ORDER — METFORMIN HCL 500 MG PO TABS: 500.0000 mg | ORAL_TABLET | Freq: Two times a day (BID) | ORAL | 1 refills | Status: DC

## 2022-03-27 ENCOUNTER — Other Ambulatory Visit: Payer: Self-pay

## 2022-03-27 MED ORDER — BD PEN NEEDLE SHORT U/F 31G X 8 MM MISC: 2 refills | Status: DC

## 2022-04-05 ENCOUNTER — Other Ambulatory Visit: Payer: Self-pay | Admitting: Student

## 2022-04-05 DIAGNOSIS — J4520 Mild intermittent asthma, uncomplicated: Secondary | ICD-10-CM

## 2022-04-09 ENCOUNTER — Other Ambulatory Visit: Payer: Self-pay

## 2022-04-09 DIAGNOSIS — I10 Essential (primary) hypertension: Secondary | ICD-10-CM

## 2022-04-09 MED ORDER — LISINOPRIL 2.5 MG PO TABS: 2.5000 mg | ORAL_TABLET | Freq: Every day | ORAL | 0 refills | Status: DC

## 2022-05-20 ENCOUNTER — Other Ambulatory Visit: Payer: Self-pay | Admitting: Student

## 2022-05-20 DIAGNOSIS — J4520 Mild intermittent asthma, uncomplicated: Secondary | ICD-10-CM

## 2022-06-13 ENCOUNTER — Other Ambulatory Visit: Payer: Self-pay | Admitting: Student

## 2022-06-13 DIAGNOSIS — J4520 Mild intermittent asthma, uncomplicated: Secondary | ICD-10-CM

## 2022-06-18 ENCOUNTER — Telehealth: Payer: Self-pay

## 2022-06-18 NOTE — Telephone Encounter (Signed)
Scheduled pt for diabetes f/u 11/2 @8 :50 am

## 2022-06-18 NOTE — Telephone Encounter (Signed)
-----   Message from Gerrit Heck, MD sent at 06/13/2022  8:43 AM EDT ----- Can you schedule patient for diabetes follow up?

## 2022-06-21 ENCOUNTER — Emergency Department (HOSPITAL_BASED_OUTPATIENT_CLINIC_OR_DEPARTMENT_OTHER): Payer: BC Managed Care – PPO

## 2022-06-21 ENCOUNTER — Other Ambulatory Visit: Payer: Self-pay

## 2022-06-21 ENCOUNTER — Emergency Department (HOSPITAL_BASED_OUTPATIENT_CLINIC_OR_DEPARTMENT_OTHER)
Admission: EM | Admit: 2022-06-21 | Discharge: 2022-06-21 | Disposition: A | Discharge status: Home / Self Care | Payer: BC Managed Care – PPO | Attending: Emergency Medicine | Admitting: Emergency Medicine

## 2022-06-21 ENCOUNTER — Encounter (HOSPITAL_BASED_OUTPATIENT_CLINIC_OR_DEPARTMENT_OTHER): Payer: Self-pay | Admitting: Emergency Medicine

## 2022-06-21 ENCOUNTER — Other Ambulatory Visit (HOSPITAL_BASED_OUTPATIENT_CLINIC_OR_DEPARTMENT_OTHER): Payer: Self-pay

## 2022-06-21 DIAGNOSIS — Z794 Long term (current) use of insulin: Secondary | ICD-10-CM | POA: Insufficient documentation

## 2022-06-21 DIAGNOSIS — Z7982 Long term (current) use of aspirin: Secondary | ICD-10-CM | POA: Insufficient documentation

## 2022-06-21 DIAGNOSIS — R1011 Right upper quadrant pain: Secondary | ICD-10-CM | POA: Diagnosis not present

## 2022-06-21 DIAGNOSIS — R197 Diarrhea, unspecified: Secondary | ICD-10-CM | POA: Insufficient documentation

## 2022-06-21 DIAGNOSIS — K760 Fatty (change of) liver, not elsewhere classified: Secondary | ICD-10-CM | POA: Diagnosis not present

## 2022-06-21 DIAGNOSIS — E1165 Type 2 diabetes mellitus with hyperglycemia: Secondary | ICD-10-CM | POA: Diagnosis not present

## 2022-06-21 DIAGNOSIS — Z7984 Long term (current) use of oral hypoglycemic drugs: Secondary | ICD-10-CM | POA: Insufficient documentation

## 2022-06-21 LAB — COMPREHENSIVE METABOLIC PANEL
ALT: 18 U/L (ref 0–44)
AST: 14 U/L — ABNORMAL LOW (ref 15–41)
Albumin: 3.4 g/dL — ABNORMAL LOW (ref 3.5–5.0)
Alkaline Phosphatase: 109 U/L (ref 38–126)
Anion gap: 6 (ref 5–15)
BUN: 12 mg/dL (ref 6–20)
CO2: 28 mmol/L (ref 22–32)
Calcium: 8.8 mg/dL — ABNORMAL LOW (ref 8.9–10.3)
Chloride: 102 mmol/L (ref 98–111)
Creatinine, Ser: 0.57 mg/dL — ABNORMAL LOW (ref 0.61–1.24)
GFR, Estimated: >60 mL/min (ref >60)
Glucose, Bld: 212 mg/dL — ABNORMAL HIGH (ref 70–99)
Potassium: 3.9 mmol/L (ref 3.5–5.1)
Sodium: 136 mmol/L (ref 135–145)
Total Bilirubin: 0.7 mg/dL (ref 0.3–1.2)
Total Protein: 7.7 g/dL (ref 6.5–8.1)

## 2022-06-21 LAB — URINALYSIS, MICROSCOPIC (REFLEX)

## 2022-06-21 LAB — CBC
HCT: 42.6 % (ref 39.0–52.0)
Hemoglobin: 14 g/dL (ref 13.0–17.0)
MCH: 26.4 pg (ref 26.0–34.0)
MCHC: 32.9 g/dL (ref 30.0–36.0)
MCV: 80.4 fL (ref 80.0–100.0)
Platelets: 354 10*3/uL (ref 150–400)
RBC: 5.3 MIL/uL (ref 4.22–5.81)
RDW: 13.7 % (ref 11.5–15.5)
WBC: 9.8 10*3/uL (ref 4.0–10.5)
nRBC: 0 % (ref 0.0–0.2)

## 2022-06-21 LAB — URINALYSIS, ROUTINE W REFLEX MICROSCOPIC
Bilirubin Urine: NEGATIVE
Glucose, UA: 250 mg/dL — AB
Hgb urine dipstick: NEGATIVE
Ketones, ur: NEGATIVE mg/dL
Leukocytes,Ua: NEGATIVE
Nitrite: NEGATIVE
Protein, ur: 30 mg/dL — AB
Specific Gravity, Urine: 1.025 (ref 1.005–1.030)
pH: 7 (ref 5.0–8.0)

## 2022-06-21 LAB — LIPASE, BLOOD: Lipase: 42 U/L (ref 11–51)

## 2022-06-21 MED ORDER — FAMOTIDINE 20 MG PO TABS: 20.0000 mg | ORAL_TABLET | Freq: Two times a day (BID) | ORAL | 0 refills | Status: DC

## 2022-06-21 MED ORDER — OMEPRAZOLE 20 MG PO CPDR: 20.0000 mg | DELAYED_RELEASE_CAPSULE | Freq: Every day | ORAL | 0 refills | Status: DC

## 2022-06-21 MED ORDER — FAMOTIDINE 20 MG PO TABS
20.0000 mg | ORAL_TABLET | Freq: Once | ORAL | Status: AC
  Administered 2022-06-21: 20 mg via ORAL
  Filled 2022-06-21: qty 1

## 2022-06-21 MED ORDER — FAMOTIDINE 20 MG PO TABS
20.0000 mg | ORAL_TABLET | Freq: Two times a day (BID) | ORAL | 0 refills | Status: DC
  Filled 2022-06-21 (×2): qty 30, 15d supply, fill #0

## 2022-06-21 MED ORDER — SUCRALFATE 1 G PO TABS
1.0000 g | ORAL_TABLET | Freq: Three times a day (TID) | ORAL | 1 refills | Status: DC
  Filled 2022-06-21: qty 60, 15d supply, fill #0

## 2022-06-21 MED ORDER — SUCRALFATE 1 G PO TABS: 1.0000 g | ORAL_TABLET | Freq: Three times a day (TID) | ORAL | 1 refills | Status: DC

## 2022-06-21 MED ORDER — OMEPRAZOLE 20 MG PO CPDR
20.0000 mg | DELAYED_RELEASE_CAPSULE | Freq: Every day | ORAL | 0 refills | Status: DC
  Filled 2022-06-21: qty 30, 30d supply, fill #0

## 2022-06-21 MED ORDER — ALUM & MAG HYDROXIDE-SIMETH 200-200-20 MG/5ML PO SUSP
30.0000 mL | Freq: Once | ORAL | Status: AC
  Administered 2022-06-21: 30 mL via ORAL
  Filled 2022-06-21: qty 30

## 2022-06-21 NOTE — ED Provider Notes (Addendum)
[facility] EMERGENCY DEPARTMENT Provider Note   CSN: [id number] Arrival date & time: 06/21/22  1346     History  Chief Complaint  Patient presents with   Abdominal Pain    Michael Foster is a 46 y.o. male.  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  No associated vomiting.  Has had some diarrhea prior to onset.  No fevers.  No chest pain or shortness of breath.  Pain does not radiate.  No treatments prior to arrival.  Patient denies alcohol use.  Denies heavy NSAID use other than a baby aspirin in the evening.  Symptoms worsen with food.  Course is constant.  Nothing makes symptoms better.  Patient states that he has been stressed out  studying for a test, which he took this morning.       Home Medications Prior to Admission medications   Medication Sig Start Date End Date Taking? Authorizing Provider  FLOVENT HFA 44 MCG/ACT inhaler INHALE 2 PUFFS INTO THE LUNGS DAILY Patient not taking: Reported on 08/27/2021 09/01/20   Matilde Haymaker, MD  albuterol (VENTOLIN HFA) 108 (90 Base) MCG/ACT inhaler TAKE 2 PUFFS BY MOUTH EVERY 6 HOURS AS NEEDED FOR WHEEZE OR SHORTNESS OF BREATH 06/13/22   Gerrit Heck, MD  aspirin EC 81 MG tablet Take 81 mg by mouth daily.    [provider]  Blood Glucose Monitoring Suppl ([place]) w/Device KIT 1 Units by Does not apply route daily. 08/29/20   Matilde Haymaker, MD  Continuous Blood Gluc Sensor (DEXCOM G7 SENSOR) MISC Replace sensor every 10 days. 02/08/22   Leavy Cella, RPH-CPP  empagliflozin (JARDIANCE) 10 MG TABS tablet Take 1 tablet (10 mg total) by mouth daily. Patient not taking: Reported on 08/27/2021 09/28/20   Matilde Haymaker, MD  glucose blood [facility] VERIO) test strip Use as  instructed 08/29/20   Matilde Haymaker, MD  insulin aspart (FIASP) 100 UNIT/ML FlexTouch Pen Inject 4 Units into the skin daily with lunch. 10/18/21   Martyn Malay, MD  insulin degludec (TRESIBA) 100 UNIT/ML FlexTouch Pen Inject 40 Units into the skin daily. Patient not taking: Reported on 10/18/2021 10/04/21   Martyn Malay, MD  insulin glargine, 2 Unit Dial, (TOUJEO MAX SOLOSTAR) 300 UNIT/ML Solostar Pen Inject 40 Units into the skin daily. 02/01/22   Gerrit Heck, MD  Insulin Pen Needle (B-D ULTRAFINE III SHORT PEN) 31G X 8 MM MISC USE AS DIRECTED ONCE DAILY 03/27/22   Gerrit Heck, MD  Lancet Devices (ONE TOUCH DELICA LANCING DEV) MISC 1 Device by Does not apply route daily. 08/29/20   Matilde Haymaker, MD  lisinopril (ZESTRIL) 2.5 MG tablet Take 1 tablet (2.5 mg total) by mouth daily. 04/09/22   Gerrit Heck, MD  Melatonin 10 MG TABS Take by mouth.    [provider]  metFORMIN (GLUCOPHAGE) 500 MG tablet Take 1 tablet (500 mg total) by mouth 2 (two) times daily with a meal. 03/21/22   Gerrit Heck, MD  Omega-3 Fatty Acids (FISH OIL) 1000 MG CAPS Take 1 capsule by mouth 2 (two) times daily.    [provider]  omeprazole (PRILOSEC) 20 MG capsule Take 1 capsule (20 mg total) by mouth daily. 06/03/21   Gerrit Heck, MD  ondansetron (ZOFRAN ODT) 8 MG disintegrating tablet 53m ODT q8  hours prn nausea Patient not taking: Reported on 08/27/2021 01/28/20   Palumbo, April, MD  OneTouch Delica Lancets 41L MISC 1 Device by Does not apply route daily. 08/29/20   Matilde Haymaker, MD  rosuvastatin (CRESTOR) 5 MG tablet TAKE 1 TABLET BY MOUTH EVERY DAY 11/22/21   Gerrit Heck, MD  sertraline (ZOLOFT) 50 MG tablet TAKE 1 TABLET(50 MG) BY MOUTH DAILY 08/30/21   Gerrit Heck, MD      Allergies    Slo-bid gyrocaps [theophylline]    Review of Systems   Review of Systems  Physical Exam Updated Vital Signs BP (!) 168/90   Pulse 78   Temp 98.3 F (36.8 C) (Oral)   Resp 17   Ht 5'  9" (1.753 m)   Wt (!) 140.6 kg   SpO2 97%   BMI 45.78 kg/m   Physical Exam Vitals and nursing note reviewed.  Constitutional:      General: He is not in acute distress.    Appearance: He is well-developed.  HENT:     Head: Normocephalic and atraumatic.  Eyes:     General:        Right eye: No discharge.        Left eye: No discharge.     Conjunctiva/sclera: Conjunctivae normal.  Cardiovascular:     Rate and Rhythm: Normal rate and regular rhythm.     Heart sounds: Normal heart sounds.  Pulmonary:     Effort: Pulmonary effort is normal.     Breath sounds: Normal breath sounds.  Abdominal:     Palpations: Abdomen is soft.     Tenderness: There is abdominal tenderness (Mild) in the right upper quadrant and epigastric area. There is no guarding or rebound. Negative signs include Murphy's sign and McBurney's sign.  Musculoskeletal:     Cervical back: Normal range of motion and neck supple.  Skin:    General: Skin is warm and dry.  Neurological:     Mental Status: He is alert.     ED Results / Procedures / Treatments   Labs (all labs ordered are listed, but only abnormal results are displayed) Labs Reviewed  URINALYSIS, ROUTINE W REFLEX MICROSCOPIC - Abnormal; Notable for the following components:      Result Value   Glucose, UA 250 (*)    Protein, ur 30 (*)    All other components within normal limits  URINALYSIS, MICROSCOPIC (REFLEX) - Abnormal; Notable for the following components:   Bacteria, UA RARE (*)    All other components within normal limits  COMPREHENSIVE METABOLIC PANEL - Abnormal; Notable for the following components:   Glucose, Bld 212 (*)    Creatinine, Ser 0.57 (*)    Calcium 8.8 (*)    Albumin 3.4 (*)    AST 14 (*)    All other components within normal limits  CBC  LIPASE, BLOOD    ED ECG REPORT   Date: 06/21/2022  Rate: 77  Rhythm: normal sinus rhythm  QRS Axis: right  Intervals: normal  ST/T Wave abnormalities: nonspecific ST/T changes   Conduction Disutrbances:none  Narrative Interpretation:   Old EKG Reviewed: unchanged from 01/18/2017  I have personally reviewed the EKG tracing and agree with the computerized printout as noted.   Radiology US Abdomen Limited RUQ (LIVER/GB)  Result Date: 06/21/2022 CLINICAL DATA:  Right upper quadrant pain. EXAM: ULTRASOUND ABDOMEN LIMITED RIGHT UPPER QUADRANT COMPARISON:  CT abdomen/pelvis Jan 28, 2020. Ultrasound June 28, 2014. FINDINGS: Gallbladder: No gallstones or wall thickening visualized.  No sonographic Murphy sign noted by sonographer. Common bile duct: Diameter: 4.8 mm, within normal limits Liver: No focal lesion identified. Diffuse hyperechogenicity of the liver. Portal vein is patent on color Doppler imaging with normal direction of blood flow towards the liver. IMPRESSION: 1. No acute abnormality. 2. Hepatic steatosis. Electronically Signed   By: Margaretha Sheffield M.D.   On: 06/21/2022 15:23    Procedures Procedures    Medications Ordered in ED Medications  alum & mag hydroxide-simeth (MAALOX/MYLANTA) 200-200-20 MG/5ML suspension 30 mL (30 mLs Oral Given 06/21/22 1625)  famotidine (PEPCID) tablet 20 mg (20 mg Oral Given 06/21/22 1624)    ED Course/ Medical Decision Making/ A&P    Patient seen and examined. History obtained directly from patient.   Labs/EKG: Ordered CBC, CMP, lipase, UA.  Imaging: Ordered right upper.  Medications/Fluids: None  Most recent vital signs reviewed and are as follows: BP (!) 168/90   Pulse 78   Temp 98.3 F (36.8 C) (Oral)   Resp 17   Ht '5\' 9"'  (1.753 m)   Wt (!) 140.6 kg   SpO2 97%   BMI 45.78 kg/m   Initial impression: right upper quadrant abdominal pain  4:44 PM Reassessment performed. Patient appears comfortable.  He is sitting on the side of the bed, no distress, conversant.  Labs personally reviewed and interpreted including: CBC unremarkable; CMP demonstrating hyperglycemia, normal kidney function, normal liver function;  lipase normal; UA without compelling signs of infection.  Imaging personally visualized and interpreted including: Ultrasound of the right upper quadrant, gallbladder agree no gallstones.  Reviewed pertinent lab work and imaging with patient at bedside. Questions answered.   Most current vital signs reviewed and are as follows: BP (!) 157/88   Pulse 74   Temp 98.3 F (36.8 C) (Oral)   Resp (!) 22   Ht '5\' 9"'  (1.753 m)   Wt (!) 140.6 kg   SpO2 97%   BMI 45.78 kg/m   Plan: Discharge to home.  Prescriptions written for: Omeprazole, Pepcid, Carafate  Other home care instructions discussed: Bland diet, avoidance of triggers  ED return instructions discussed: The patient was urged to return to the Emergency Department immediately with worsening of current symptoms, worsening abdominal pain, persistent vomiting, blood noted in stools, fever, or any other concerns. The patient verbalized understanding.   Follow-up instructions discussed: Patient encouraged to follow-up with their PCP in 3 days.                           Medical Decision Making Amount and/or Complexity of Data Reviewed Labs: ordered. Radiology: ordered.  Risk OTC drugs. Prescription drug management.   For this patient's complaint of abdominal pain, the following conditions were considered on the differential diagnosis: gastritis/PUD, enteritis/duodenitis, appendicitis, cholelithiasis/cholecystitis, cholangitis, pancreatitis, ruptured viscus, colitis, diverticulitis, small/large bowel obstruction, proctitis, cystitis, pyelonephritis, ureteral colic, aortic dissection, aortic aneurysm. Atypical chest etiologies were also considered including ACS, PE, and pneumonia.   Pain is not exertional, worsening with food, without vomiting, radiation, or diaphoresis.  Low concern for ACS/PE at this time.  EKG reviewed and is abnormal but appears unchanged from comparison 2018, which is reassuring.  The patient's vital signs,  pertinent lab work and imaging were reviewed and interpreted as discussed in the ED course. Hospitalization was considered for further testing, treatments, or serial exams/observation. However as patient is well-appearing, has a stable exam, and reassuring studies today, I do not feel that they warrant admission at  this time. This plan was discussed with the patient who verbalizes agreement and comfort with this plan and seems reliable and able to return to the Emergency Department with worsening or changing symptoms.          Final Clinical Impression(s) / ED Diagnoses Final diagnoses:  Abdominal pain, right upper quadrant    Rx / DC Orders ED Discharge Orders          Ordered    omeprazole (PRILOSEC) 20 MG capsule  Daily        06/21/22 1642    famotidine (PEPCID) 20 MG tablet  2 times daily        06/21/22 1642    sucralfate (CARAFATE) 1 g tablet  3 times daily with meals & bedtime        06/21/22 1642                Carlisle Cater, PA-C 06/21/22 1646    Drenda Freeze, MD 06/21/22 [phone]

## 2022-06-21 NOTE — Discharge Instructions (Signed)
Please read and follow all provided instructions.  Your diagnoses today include:  1. Abdominal pain, right upper quadrant     Tests performed today include: Blood cell counts and platelets Kidney and liver function tests: normal Pancreas function test (called lipase): no pancreatitis today Urine test to look for infection EKG: is abnormal but not changed from back in 2018 Vital signs. See below for your results today.   Medications prescribed:  Omeprazole (Prilosec) - stomach acid reducer  This medication can be found over-the-counter  Pepcid (famotidine) - antihistamine  You can find this medication over-the-counter.   DO NOT exceed:  20mg  Pepcid every 12 hours  Carafate - for stomach upset and to protect your stomach  Take any prescribed medications only as directed.  Home care instructions:  Follow any educational materials contained in this packet.  Follow-up instructions: Please follow-up with your primary care provider in the next 3 days for further evaluation of your symptoms.    Return instructions:  SEEK IMMEDIATE MEDICAL ATTENTION IF: The pain does not go away or becomes severe  A temperature above 101F develops  Repeated vomiting occurs (multiple episodes)  The pain becomes localized to portions of the abdomen. The right side could possibly be appendicitis. In an adult, the left lower portion of the abdomen could be colitis or diverticulitis.  Blood is being passed in stools or vomit (bright red or black tarry stools)  You develop chest pain, difficulty breathing, dizziness or fainting, or become confused, poorly responsive, or inconsolable (young children) If you have any other emergent concerns regarding your health  Additional Information: Abdominal (belly) pain can be caused by many things. Your caregiver performed an examination and possibly ordered blood/urine tests and imaging (CT scan, x-rays, ultrasound). Many cases can be observed and treated at  home after initial evaluation in the emergency department. Even though you are being discharged home, abdominal pain can be unpredictable. Therefore, you need a repeated exam if your pain does not resolve, returns, or worsens. Most patients with abdominal pain don't have to be admitted to the hospital or have surgery, but serious problems like appendicitis and gallbladder attacks can start out as nonspecific pain. Many abdominal conditions cannot be diagnosed in one visit, so follow-up evaluations are very important.  Your vital signs today were: BP (!) 157/88   Pulse 74   Temp 98.3 F (36.8 C) (Oral)   Resp (!) 22   Ht 5\' 9"  (1.753 m)   Wt (!) 140.6 kg   SpO2 97%   BMI 45.78 kg/m  If your blood pressure (bp) was elevated above 135/85 this visit, please have this repeated by your doctor within one month. --------------

## 2022-06-21 NOTE — ED Notes (Signed)
Attempted IV access to LAC, tol well, blood obtained for labs but unable to thread IV catheter.

## 2022-06-21 NOTE — ED Triage Notes (Signed)
Pt POV c/o RUQ abd pain x2 days. Denies n/v. Pt reports diarrhea prior to abd pain starting. Denies chest pain, denies shortness of breath. Hx of pancreatitis.

## 2022-06-21 NOTE — ED Notes (Signed)
ED Provider at bedside. 

## 2022-06-21 NOTE — ED Notes (Signed)
Patient transported to Ultrasound 

## 2022-07-10 ENCOUNTER — Other Ambulatory Visit: Payer: Self-pay | Admitting: Student

## 2022-07-10 DIAGNOSIS — I10 Essential (primary) hypertension: Secondary | ICD-10-CM

## 2022-07-25 ENCOUNTER — Ambulatory Visit (INDEPENDENT_AMBULATORY_CARE_PROVIDER_SITE_OTHER): Payer: BC Managed Care – PPO | Admitting: Student

## 2022-07-25 ENCOUNTER — Encounter: Payer: Self-pay | Admitting: Student

## 2022-07-25 ENCOUNTER — Other Ambulatory Visit: Payer: Self-pay | Admitting: Student

## 2022-07-25 VITALS — BP 128/70 | HR 86 | Ht 69.0 in | Wt 314.0 lb

## 2022-07-25 DIAGNOSIS — E1165 Type 2 diabetes mellitus with hyperglycemia: Secondary | ICD-10-CM

## 2022-07-25 LAB — POCT GLYCOSYLATED HEMOGLOBIN (HGB A1C): HbA1c, POC (controlled diabetic range): 11.5 % — AB (ref 0.0–7.0)

## 2022-07-25 MED ORDER — TOUJEO MAX SOLOSTAR 300 UNIT/ML ~~LOC~~ SOPN: 45.0000 [IU] | PEN_INJECTOR | Freq: Every day | SUBCUTANEOUS | 3 refills | Status: DC

## 2022-07-25 NOTE — Assessment & Plan Note (Signed)
A1c improved from last time at 11.5 from 13.3.  Patient has Dexcom in place and has no lows or very lows.  We discussed that mealtime insulin likely will be his next step.  Patient is hesitant to try Jardiance due to reading about fournier's gangrene (A1c too high for that currently).  Says he has tried Ozempic in the past but got pancreatitis from this. -Increase to 45 units Toujeo, if over 180 may increase an additional 5 units -Consider mealtime insulin at next visit -2-week follow-up with pharmacy clinic -1-month follow-up for A1c

## 2022-07-25 NOTE — Progress Notes (Signed)
    SUBJECTIVE:   CHIEF COMPLAINT / HPI:   Diabetic Follow Up: Patient is a 46 y.o. male who present today for diabetic follow up.   Patient endorses no problems  Home medications include:Glargine tuojeo 40 U daily, Fiasp aspart 4 U with lunch (not taking-he says he feels like it is too many medications), metformin 1000 mg nightly.  He is scared to be on Jardiance because her read about fournier's gangrene Has tried Ozempic before-got pancreatitis  ACEi/ARB: yes Statin: yes Patient endorses taking these medications as prescribed. Dexcom <1% in range, no lows or very lows GMI 11.1%   Most recent A1Cs:  Lab Results  Component Value Date   HGBA1C 11.5 (A) 07/25/2022   HGBA1C 13.3 (A) 08/27/2021   HGBA1C 14.3 (A) 08/29/2020   Last Microalbumin, LDL, Creatinine: Lab Results  Component Value Date   MICROALBUR 7.9 (H) 01/04/2015   LDLCALC 93 08/27/2021   CREATININE 0.57 (L) 06/21/2022   Patient does check blood glucose on a regular basis. Dexcom  PERTINENT  PMH / PSH: PUD  OBJECTIVE:   BP 128/70   Pulse 86   Ht 5\' 9"  (1.753 m)   Wt (!) 314 lb (142.4 kg)   SpO2 96%   BMI 46.37 kg/m    General: Well appearing, NAD, awake, alert, responsive to questions Head: Normocephalic atraumatic CV: Regular rate and rhythm no murmurs rubs or gallops Respiratory: Clear to ausculation bilaterally, no increased work of breathing Extremities: Moves upper and lower extremities freely, no edema in LE   ASSESSMENT/PLAN:   Diabetes type 2, uncontrolled (HCC) A1c improved from last time at 11.5 from 13.3.  Patient has Dexcom in place and has no lows or very lows.  We discussed that mealtime insulin likely will be his next step.  Patient is hesitant to try Jardiance due to reading about fournier's gangrene (A1c too high for that currently).  Says he has tried Ozempic in the past but got pancreatitis from this. -Increase to 45 units Toujeo, if over 180 may increase an additional 5  units -Consider mealtime insulin at next visit -2-week follow-up with pharmacy clinic -53-month follow-up for A1c    Gerrit Heck, MD [place]

## 2022-07-25 NOTE — Patient Instructions (Addendum)
It was great to see you! Thank you for allowing me to participate in your care!   Our plans for today:  - Your A1c was 11.5, improved from last time. Let's increase your basal to 45 units daily, if your fasting glucose is above 180 you may increase another 5 units. - I think meal time insulin may be beneficial to consider at your next visit - schedule visit with pharmacy in 2 weeks -A1c recheck in 3 months with me  Take care and seek immediate care sooner if you develop any concerns.  Gerrit Heck, MD

## 2022-07-30 ENCOUNTER — Telehealth: Payer: Self-pay

## 2022-07-30 DIAGNOSIS — E1165 Type 2 diabetes mellitus with hyperglycemia: Secondary | ICD-10-CM

## 2022-07-30 NOTE — Telephone Encounter (Signed)
Pharmacy calls nurse line requesting a new prescription for Toujeo.   Pharmacy reports the unit dosage can not be an odd number.   Please resend for an even number unit dosage.   Will forward to PCP.

## 2022-08-06 ENCOUNTER — Other Ambulatory Visit: Payer: Self-pay | Admitting: Student

## 2022-08-06 DIAGNOSIS — F32A Depression, unspecified: Secondary | ICD-10-CM

## 2022-08-07 ENCOUNTER — Ambulatory Visit: Payer: BC Managed Care – PPO | Admitting: Pharmacist

## 2022-08-28 ENCOUNTER — Ambulatory Visit (INDEPENDENT_AMBULATORY_CARE_PROVIDER_SITE_OTHER): Payer: BC Managed Care – PPO | Admitting: Pharmacist

## 2022-08-28 ENCOUNTER — Encounter: Payer: Self-pay | Admitting: Pharmacist

## 2022-08-28 ENCOUNTER — Other Ambulatory Visit (HOSPITAL_BASED_OUTPATIENT_CLINIC_OR_DEPARTMENT_OTHER): Payer: Self-pay

## 2022-08-28 DIAGNOSIS — E1165 Type 2 diabetes mellitus with hyperglycemia: Secondary | ICD-10-CM

## 2022-08-28 MED ORDER — INSULIN ASPART (W/NIACINAMIDE) 100 UNIT/ML ~~LOC~~ SOPN: 10.0000 [IU] | PEN_INJECTOR | Freq: Two times a day (BID) | SUBCUTANEOUS | 0 refills | Status: DC

## 2022-08-28 MED ORDER — TOUJEO MAX SOLOSTAR 300 UNIT/ML ~~LOC~~ SOPN
50.0000 [IU] | PEN_INJECTOR | Freq: Every day | SUBCUTANEOUS | 3 refills | Status: DC
  Filled 2022-08-28 – 2022-08-29 (×2): qty 3, 18d supply, fill #0

## 2022-08-28 NOTE — Assessment & Plan Note (Signed)
Diabetes longstanding for ~ 10 years currently under poor control despite use of basal insulin. Patient is able to verbalize appropriate hypoglycemia management plan. Medication adherence appears good. 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 -Increased dose of basal insulin Toujeo (insulin glargine)  from 45 to 50 units once daily.   -Started rapid insulin Fiasp (insulin aspart) at 10 units prior to two largest (latest) meals of the day.  -Unable to take GLP-1 due to Hx of pancreatitis.   -Continued metformin 1000mg  once daily.  Consider dose increase in the future. -Patient educated on purpose, proper use, and potential adverse effects of hypoglycemia.  -Extensively discussed pathophysiology of diabetes, recommended lifestyle interventions, dietary effects on blood sugar control.  -Counseled on s/sx of and management of hypoglycemia.

## 2022-08-28 NOTE — Patient Instructions (Addendum)
Nice to see you today.   Goals we discussed today: Water after midnight,  Reduce rice intake Work out at gym at least 3 days per week  At the store More green vegetables Smaller portions of "garbage food"  Increase Toujeo Max to 50 untis daily Start Fiasp 10 units prior to two late day meals.

## 2022-08-28 NOTE — Progress Notes (Signed)
Reviewed: I agree with the documentation and management of Dr. Koval. 

## 2022-08-28 NOTE — Progress Notes (Signed)
S:     Chief Complaint  Patient presents with   Medication Management    Diabetes - Dexcom   46 y.o. male who presents for diabetes evaluation, education, and management.  PMH is significant for obesity, hypercholesterolemia and Hypertension.  Patient was referred and last seen by Primary Care Provider, Dr. Laroy Apple, on 07/25/2022.  At last visit, Toujeo (insulin glargine) was increased from 40 to 45 units daily.   Today, patient arrives in good spirits (it is his birthday and he has the day OFF from work)  and presents without any assistance.   Patient reports Diabetes has been under very poor control.   Current diabetes medications include: Toujeo (insulin glargine 45 units once daily at ~ 2:00 PM) Current hypertension medications include: lisinopril 2.5mg  daily Current hyperlipidemia medications include: rosuvastatin 5mg  daily  Patient reports adherence to taking all medications as prescribed.   Do you have any problems obtaining medications due to transportation or finances? no Insurance coverage: BCBS  Patient denies hypoglycemic events.  Patient reports nocturia (nighttime urination) multiple times per night.   Patient reported dietary habits: Eats 3 meals/day Breakfast: Does NOT eat anything until ~ 2:00 PM Lunch: at 2:00 PM - rice, a meat (ground beef or pork) Dinner: at 7:00 PMsimilar - ground beef or pork with rice  Stir-Fry Mix - Cabbage, spinich, carrots, broccoli, baby corn,  Weekends - eats out with girlfriend- order of pizza, or sushi buffet, (double quarter pounder meal - no lettuce, onions tomato  [employer] Late Nights "meal"  11:00-12:00PM - "Garbage - food" - tropical fruit - trailmix, other dinner foods,  Sleeps at 3:00-4:00 AM Snacks: Does not keep "sweets in the house" - Chips - lays original style / Trailmix / peanut Drinks: diet Mt. Dew  New goal Drink more water   Patient-reported exercise habits: currently minimal "scheduled" exercise but  was willing to set smart goal of 3x per week at local gym.    O:   Review of Systems  Genitourinary:  Positive for frequency.  All other systems reviewed and are negative.   Physical Exam Constitutional:      Appearance: Normal appearance. He is obese.  Pulmonary:     Effort: Pulmonary effort is normal.  Neurological:     Mental Status: He is alert.  Psychiatric:        Mood and Affect: Mood normal.        Behavior: Behavior normal.        Thought Content: Thought content normal.        Judgment: Judgment normal.     CGM Download:  connected with data sharing Dexcom G7 Average Glucose: > 300 mg/dL Glucose Variability: ? (goal <36%) Time in Goal:  - Time in range 70-180: 0% - Time above range: 100% - Time below range: 0% Observed patterns: Consistently > 300 with some readings 250-300   Lab Results  Component Value Date   HGBA1C 11.5 (A) 07/25/2022   Vitals:   08/28/22 1058  BP: 136/64  Pulse: 83  SpO2: 98%    Lipid Panel     Component Value Date/Time   CHOL 164 08/27/2021 1656   TRIG 231 (H) 08/27/2021 1656   HDL 32 (L) 08/27/2021 1656   CHOLHDL 5.1 (H) 08/27/2021 1656   CHOLHDL 5.3 01/04/2015 1111   VLDL 24 01/04/2015 1111   LDLCALC 93 08/27/2021 1656   LDLDIRECT 64.5 01/17/2010 0000    Clinical Atherosclerotic Cardiovascular Disease (ASCVD): No  The  10-year ASCVD risk score (Arnett DK, et al., 2019) is: 5.6%   Values used to calculate the score:     Age: 42 years     Sex: Male     Is Non-Hispanic African American: No     Diabetic: Yes     Tobacco smoker: No     Systolic Blood Pressure: 136 mmHg     Is BP treated: Yes     HDL Cholesterol: 32 mg/dL     Total Cholesterol: 164 mg/dL   A/P: Diabetes longstanding for ~ 10 years currently under poor control despite use of basal insulin. Patient is able to verbalize appropriate hypoglycemia management plan. Medication adherence appears good. Control is suboptimal due to dietary indiscretion, limited  exercise (sedentary job - works from home) and suboptimal medication regimen. -Increased dose of basal insulin Toujeo (insulin glargine)  from 45 to 50 units once daily.   -Started rapid insulin Fiasp (insulin aspart) at 10 units prior to two largest (latest) meals of the day.  - Unable to take  GLP-1 due to Hx of pancreatitis.   -Continued metformin 1000mg  once daily.  Consider dose increase in the future. -Patient educated on purpose, proper use, and potential adverse effects of hypoglycemia.  -Extensively discussed pathophysiology of diabetes, recommended lifestyle interventions, dietary effects on blood sugar control.  -Counseled on s/sx of and management of hypoglycemia.   Patient with history of ADHD - inquired about restarting medication therapy.  - Stopped  Vyvance due to cost only.   Appears there are other stimulant options available on BCBS formulary.  - Deferred to PCP visit with Dr. -  - Visit to be scheduled by patient.   Written patient instructions provided. Patient verbalized understanding of treatment plan.  Total time in face to face counseling 70 minutes.    Follow-up:  Pharmacist 4 weeks.

## 2022-08-29 ENCOUNTER — Other Ambulatory Visit (HOSPITAL_BASED_OUTPATIENT_CLINIC_OR_DEPARTMENT_OTHER): Payer: Self-pay

## 2022-09-04 ENCOUNTER — Other Ambulatory Visit: Payer: Self-pay | Admitting: Student

## 2022-09-04 ENCOUNTER — Other Ambulatory Visit: Payer: Self-pay | Admitting: Family Medicine

## 2022-09-04 DIAGNOSIS — I10 Essential (primary) hypertension: Secondary | ICD-10-CM

## 2022-09-05 MED ORDER — INSULIN ASPART (W/NIACINAMIDE) 100 UNIT/ML ~~LOC~~ SOPN: 10.0000 [IU] | PEN_INJECTOR | Freq: Two times a day (BID) | SUBCUTANEOUS | 0 refills | Status: DC

## 2022-09-05 MED ORDER — LISINOPRIL 2.5 MG PO TABS: 2.5000 mg | ORAL_TABLET | Freq: Every day | ORAL | 0 refills | Status: DC

## 2022-09-06 ENCOUNTER — Other Ambulatory Visit (HOSPITAL_BASED_OUTPATIENT_CLINIC_OR_DEPARTMENT_OTHER): Payer: Self-pay

## 2022-09-16 ENCOUNTER — Other Ambulatory Visit: Payer: Self-pay | Admitting: Student

## 2022-09-16 DIAGNOSIS — E1165 Type 2 diabetes mellitus with hyperglycemia: Secondary | ICD-10-CM

## 2022-10-02 ENCOUNTER — Ambulatory Visit (INDEPENDENT_AMBULATORY_CARE_PROVIDER_SITE_OTHER): Payer: BC Managed Care – PPO | Admitting: Pharmacist

## 2022-10-02 ENCOUNTER — Encounter: Payer: Self-pay | Admitting: Pharmacist

## 2022-10-02 VITALS — BP 137/83 | HR 73 | Wt 309.2 lb

## 2022-10-02 DIAGNOSIS — E1165 Type 2 diabetes mellitus with hyperglycemia: Secondary | ICD-10-CM | POA: Diagnosis not present

## 2022-10-02 MED ORDER — INSULIN ASPART (W/NIACINAMIDE) 100 UNIT/ML ~~LOC~~ SOPN: 10.0000 [IU] | PEN_INJECTOR | Freq: Two times a day (BID) | SUBCUTANEOUS | 0 refills | Status: AC

## 2022-10-02 MED ORDER — TOUJEO MAX SOLOSTAR 300 UNIT/ML ~~LOC~~ SOPN: 50.0000 [IU] | PEN_INJECTOR | Freq: Every day | SUBCUTANEOUS | 3 refills | Status: AC

## 2022-10-02 NOTE — Patient Instructions (Addendum)
Nice to see you today!  Today we looked at your CGM and made a dose adjustment to your meal-time insulin.   Inject 10-20 units of Fiasp (insulin aspart) depending on carbohydrate intake of your meal.   Continue Toujeo Max (insulin glargine) at 50 units daily.

## 2022-10-02 NOTE — Assessment & Plan Note (Signed)
Diabetes longstanding currently much improved on insulin regimen based on symptoms and CGM readings.  Patient is able to verbalize appropriate hypoglycemia management plan. Medication adherence appears good. Control is suboptimal due to suboptimal insulin dosing -Continued basal insulin Toujeo Max (insulin glargine) at 50 units daily -Increased dose of rapid insulin Fiasp (insulin aspart) from 10 to 15-20 units with meals.  Discussed use of 15 units for most meals, and use of 20 units when consuming > 30 grams of carbohydrate.   -Continued metformin 1000mg  daily.  -Patient educated on purpose, proper use, and potential adverse effects.  -Extensively discussed pathophysiology of diabetes, recommended lifestyle interventions, dietary effects on blood sugar control.

## 2022-10-02 NOTE — Progress Notes (Signed)
S:     Chief Complaint  Patient presents with   Medication Management    Diabetes - Insulin   47 y.o. male who presents for diabetes evaluation, education, and management.  PMH is significant for diabetes and history of pancreatitis.  Patient was referred and last seen by Primary Care Provider, Dr. Jinny Sanders, on 07/25/2022.  Last seen in Rx clinic 08/28/2022 At last visit, significant time with education and basal bolus insulin was discussed.   Today, patient arrives in good spirits and presents without any assistance.   Current diabetes medications include: Toujeo (insulin glargine 50 units once daily at ~ 2:00 PM and Bolus - Fiasp (insulin Aspart)  10 units with two large meals daily Current hypertension medications include: lisinopril 2.5mg  daily Current hyperlipidemia medications include: rosuvastatin 5mg  daily  Patient reports adherence to taking all medications as prescribed.    Do you feel that your medications are working for you? yes Have you been experiencing any side effects to the medications prescribed? no   Patient denies hypoglycemic events.    Patient reports decreased nocturia (nighttime urination) minimal with only 1x per night most nights    Patient reported dietary habits: Eats 2 larger meals/day Drinks: reports more water and less soda - especially at night   Patient-reported exercise habits: some Gym time for a few weeks and then none for the last few weeks - Plans no gym time in January BUT  intends to start exercising at [facility] in February - 3 x per week.    O:   Review of Systems  All other systems reviewed and are negative.   Physical Exam Constitutional:      Appearance: Normal appearance. He is obese.  Pulmonary:     Effort: Pulmonary effort is normal.  Neurological:     Mental Status: He is alert.  Psychiatric:        Mood and Affect: Mood normal.        Thought Content: Thought content normal.     CGM Download:  Average  Glucose: 262 mg/dL Glucose Management Indicator: 9.6   Time in Goal:  - Time in range 70-180: 2%% - Time above range: 34%% - Time below range: 64%% Observed patterns: overall improved control however remains high most of the day.    Lab Results  Component Value Date   HGBA1C 11.5 (A) 07/25/2022   Vitals:   10/02/22 1044  BP: 137/83  Pulse: 73  SpO2: 99%    Lipid Panel     Component Value Date/Time   CHOL 164 08/27/2021 1656   TRIG 231 (H) 08/27/2021 1656   HDL 32 (L) 08/27/2021 1656   CHOLHDL 5.1 (H) 08/27/2021 1656   CHOLHDL 5.3 01/04/2015 1111   VLDL 24 01/04/2015 1111   LDLCALC 93 08/27/2021 1656   LDLDIRECT 64.5 01/17/2010 0000    A/P: Diabetes longstanding currently much improved on insulin regimen based on symptoms and CGM readings.  Patient is able to verbalize appropriate hypoglycemia management plan. Medication adherence appears good. Control is suboptimal due to suboptimal insulin dosing -Continued basal insulin Toujeo Max (insulin glargine) at 50 units daily -Increased dose of rapid insulin Fiasp (insulin aspart) from 10 to 15-20 units with meals.  Discussed use of 15 units for most meals, and use of 20 units when consuming > 30 grams of carbohydrate.   -Continued metformin 1000mg  daily.  -Patient educated on purpose, proper use, and potential adverse effects.  -Extensively discussed pathophysiology of diabetes, recommended lifestyle interventions,  dietary effects on blood sugar control.  -Counseled on s/sx of and management of hypoglycemia.   Medication Samples have been provided to the patient.  Drug name: Fiasp (insulin aspart)       Strength: 100units/ml        Qty: 3 pens  LOT:  [id number] Exp.Date: 02/21/2024  The patient has been instructed regarding the correct time, dose, and frequency of taking this medication, including desired effects and most common side effects.   Janeann Forehand 2:23 PM 10/02/2022   Written patient instructions provided. Patient  verbalized understanding of treatment plan.  Total time in face to face counseling 35 minutes.    Follow-up:  Pharmacist after next PCP visit - perhaps in 12/2022. PCP clinic visit in in mid March - consider A1C at that time..  Patient seen with Dixon Boos, PharmD Candidate.

## 2022-10-02 NOTE — Progress Notes (Signed)
Reviewed: I agree with Dr. Koval's documentation and management. 

## 2022-10-24 ENCOUNTER — Encounter (HOSPITAL_BASED_OUTPATIENT_CLINIC_OR_DEPARTMENT_OTHER): Payer: Self-pay | Admitting: Emergency Medicine

## 2022-10-24 ENCOUNTER — Other Ambulatory Visit: Payer: Self-pay

## 2022-10-24 ENCOUNTER — Emergency Department (HOSPITAL_BASED_OUTPATIENT_CLINIC_OR_DEPARTMENT_OTHER): Payer: BC Managed Care – PPO

## 2022-10-24 ENCOUNTER — Emergency Department (HOSPITAL_BASED_OUTPATIENT_CLINIC_OR_DEPARTMENT_OTHER)
Admission: EM | Admit: 2022-10-24 | Discharge: 2022-10-24 | Disposition: A | Discharge status: Home / Self Care | Payer: BC Managed Care – PPO | Attending: Emergency Medicine | Admitting: Emergency Medicine

## 2022-10-24 DIAGNOSIS — Z7984 Long term (current) use of oral hypoglycemic drugs: Secondary | ICD-10-CM | POA: Insufficient documentation

## 2022-10-24 DIAGNOSIS — Z7982 Long term (current) use of aspirin: Secondary | ICD-10-CM | POA: Diagnosis not present

## 2022-10-24 DIAGNOSIS — E119 Type 2 diabetes mellitus without complications: Secondary | ICD-10-CM | POA: Diagnosis not present

## 2022-10-24 DIAGNOSIS — Z79899 Other long term (current) drug therapy: Secondary | ICD-10-CM | POA: Diagnosis not present

## 2022-10-24 DIAGNOSIS — Z87891 Personal history of nicotine dependence: Secondary | ICD-10-CM | POA: Insufficient documentation

## 2022-10-24 DIAGNOSIS — M79672 Pain in left foot: Secondary | ICD-10-CM | POA: Diagnosis not present

## 2022-10-24 DIAGNOSIS — Z794 Long term (current) use of insulin: Secondary | ICD-10-CM | POA: Insufficient documentation

## 2022-10-24 DIAGNOSIS — I10 Essential (primary) hypertension: Secondary | ICD-10-CM | POA: Insufficient documentation

## 2022-10-24 DIAGNOSIS — S91302A Unspecified open wound, left foot, initial encounter: Secondary | ICD-10-CM | POA: Diagnosis not present

## 2022-10-24 NOTE — ED Triage Notes (Signed)
Pt presents reporting a wound on his left heel.  Pt is a diabetic.  Pt showed pic in triage of cracked dry skin on his heel and one particular crack is deeper in appearance so he would like it evaluated and recommendations to prevent it from becoming a diabetic wound.

## 2022-10-24 NOTE — ED Provider Notes (Addendum)
[place] [facility] Provider Note   CSN: [id number] Arrival date & time: 10/24/22  1945     History  Chief Complaint  Patient presents with   Foot Pain    Michael Foster is a 47 y.o. male.  Patient followed by [facility] family practice.  Has a history of diabetes.  Based on their notes sometimes poorly controlled.  Patient was bowling yesterday and had a split on the skin in his left heel.  Not painful not tender.  Past medical history sniffing for diabetes hypertension.  Patient a former smoker quit in 2014.  Patient is concerned about possible infection developing in the foot.       Home Medications Prior to Admission medications   Medication Sig Start Date End Date Taking? Authorizing Provider  albuterol (VENTOLIN HFA) 108 (90 Base) MCG/ACT inhaler TAKE 2 PUFFS BY MOUTH EVERY 6 HOURS AS NEEDED FOR WHEEZE OR SHORTNESS OF BREATH 06/13/22   Gerrit Heck, MD  aspirin EC 81 MG tablet Take 81 mg by mouth daily.    [provider]  Blood Glucose Monitoring Suppl ([place]) w/Device KIT 1 Units by Does not apply route daily. Patient not taking: Reported on 08/28/2022 08/29/20   Matilde Haymaker, MD  Continuous Blood Gluc Sensor (DEXCOM G7 SENSOR) MISC Replace sensor every 10 days. 02/08/22   Leavy Cella, RPH-CPP  glucose blood (ONETOUCH VERIO) test strip Use as instructed Patient not taking: Reported on 08/28/2022 08/29/20   Matilde Haymaker, MD  insulin aspart (FIASP) 100 UNIT/ML FlexTouch Pen Inject 10-20 Units into the skin 2 (two) times daily before a meal. 10/02/22   Hensel, Jamal Collin, MD  insulin glargine, 2 Unit Dial, (TOUJEO MAX SOLOSTAR) 300 UNIT/ML Solostar Pen Inject 50 Units into the skin daily. 10/02/22   Zenia Resides, MD  Insulin Pen Needle (B-D ULTRAFINE III SHORT PEN) 31G X 8 MM MISC USE AS DIRECTED ONCE DAILY 03/27/22   Gerrit Heck, MD  Lancet Devices (ONE TOUCH DELICA LANCING DEV) MISC 1 Device by Does not  apply route daily. 08/29/20   Matilde Haymaker, MD  lisinopril (ZESTRIL) 2.5 MG tablet Take 1 tablet (2.5 mg total) by mouth daily. 09/05/22   Gerrit Heck, MD  Melatonin 10 MG TABS Take 10 mg by mouth at bedtime.    [provider]  metFORMIN (GLUCOPHAGE) 500 MG tablet Take 1 tablet (500 mg total) by mouth 2 (two) times daily with a meal. 03/21/22   Gerrit Heck, MD  Omega-3 Fatty Acids (FISH OIL) 1000 MG CAPS Take 1 capsule by mouth daily.    [provider]  rosuvastatin (CRESTOR) 5 MG tablet TAKE 1 TABLET BY MOUTH EVERY DAY 11/22/21   Gerrit Heck, MD  sertraline (ZOLOFT) 50 MG tablet TAKE 1 TABLET BY MOUTH EVERY DAY 08/06/22   Gerrit Heck, MD      Allergies    Semaglutide and Slo-bid gyrocaps [theophylline]    Review of Systems   Review of Systems  Constitutional:  Negative for chills and fever.  HENT:  Negative for ear pain and sore throat.   Eyes:  Negative for pain and visual disturbance.  Respiratory:  Negative for cough and shortness of breath.   Cardiovascular:  Negative for chest pain and palpitations.  Gastrointestinal:  Negative for abdominal pain and vomiting.  Genitourinary:  Negative for dysuria and hematuria.  Musculoskeletal:  Negative for arthralgias and back pain.  Skin:  Positive for wound. Negative for color change  and rash.  Neurological:  Negative for seizures and syncope.  All other systems reviewed and are negative.   Physical Exam Updated Vital Signs BP (!) 173/77 (BP Location: Left Arm)   Pulse 79   Temp 98.4 F (36.9 C) (Oral)   Resp 16   SpO2 99%  Physical Exam Vitals and nursing note reviewed.  Constitutional:      General: He is not in acute distress.    Appearance: Normal appearance. He is well-developed.  HENT:     Head: Normocephalic and atraumatic.  Eyes:     Conjunctiva/sclera: Conjunctivae normal.     Pupils: Pupils are equal, round, and reactive to light.  Cardiovascular:     Rate and Rhythm: Normal rate  and regular rhythm.     Heart sounds: No murmur heard. Pulmonary:     Effort: Pulmonary effort is normal. No respiratory distress.     Breath sounds: Normal breath sounds.  Abdominal:     Palpations: Abdomen is soft.     Tenderness: There is no abdominal tenderness.  Musculoskeletal:        General: Signs of injury present. No swelling or tenderness.     Cervical back: Normal range of motion and neck supple.     Comments: Posterior aspect of the left foot heel with about a 1.5 to 2 cm crack in the skin without any erythema no discharge the crack is not that deep.  Lots of dried skin to the bottom of the foot.  Neurovascular intact distally.  Skin:    General: Skin is warm and dry.     Capillary Refill: Capillary refill takes less than 2 seconds.  Neurological:     General: No focal deficit present.     Mental Status: He is alert and oriented to person, place, and time.  Psychiatric:        Mood and Affect: Mood normal.     ED Results / Procedures / Treatments   Labs (all labs ordered are listed, but only abnormal results are displayed) Labs Reviewed - No data to display  EKG None  Radiology DG Foot Complete Left  Result Date: 10/24/2022 CLINICAL DATA:  Evaluate for osteomyelitis.  Wound on heel. EXAM: LEFT FOOT - COMPLETE 3+ VIEW COMPARISON:  Left foot x-ray 07/31/2019 FINDINGS: There is no evidence of fracture or dislocation. There is no evidence of arthropathy or other focal bone abnormality. Soft tissues are unremarkable. IMPRESSION: Negative. Electronically Signed   By: Ronney Asters M.D.   On: 10/24/2022 22:03    Procedures Procedures    Medications Ordered in ED Medications - No data to display  ED Course/ Medical Decision Making/ A&P                             Medical Decision Making  X-ray of the left foot without any bony abnormalities.  Specifically no evidence of any foreign body.  Wound does not seem to be infected.  Would recommend soaking the foot in  warm water for 20 minutes a day and applying antibiotic ointment into the cracked area.  Also would recommend Eucerin moisturizing cream to the bottom of both feet.  Recommend he make an appointment to follow-up with [facility].  Patient will return for any new or worse symptoms. Final Clinical Impression(s) / ED Diagnoses Final diagnoses:  Foot pain, left    Rx / DC Orders ED Discharge Orders     None  Fredia Sorrow, MD 10/24/22 2229    Fredia Sorrow, MD 10/24/22 2229

## 2022-10-24 NOTE — Discharge Instructions (Addendum)
Make an appointment to follow-up with [facility].  Would recommend soaking the foot for 20 minutes in warm water a day.  Then applying antibiotic ointment into the crack on the heel.  Also would recommend Eucerin cream applied heavily to the bottom of both feet to moisturize the foot.  X-ray without any bony abnormalities.  No evidence of foreign body.

## 2022-10-27 ENCOUNTER — Other Ambulatory Visit: Payer: Self-pay

## 2022-10-27 ENCOUNTER — Emergency Department (HOSPITAL_COMMUNITY)
Admission: EM | Admit: 2022-10-27 | Discharge: 2022-10-27 | Disposition: A | Discharge status: Home / Self Care | Payer: BC Managed Care – PPO | Attending: Emergency Medicine | Admitting: Emergency Medicine

## 2022-10-27 ENCOUNTER — Encounter (HOSPITAL_COMMUNITY): Payer: Self-pay | Admitting: Emergency Medicine

## 2022-10-27 DIAGNOSIS — Z7982 Long term (current) use of aspirin: Secondary | ICD-10-CM | POA: Insufficient documentation

## 2022-10-27 DIAGNOSIS — R030 Elevated blood-pressure reading, without diagnosis of hypertension: Secondary | ICD-10-CM | POA: Insufficient documentation

## 2022-10-27 DIAGNOSIS — K047 Periapical abscess without sinus: Secondary | ICD-10-CM | POA: Diagnosis not present

## 2022-10-27 DIAGNOSIS — K0889 Other specified disorders of teeth and supporting structures: Secondary | ICD-10-CM

## 2022-10-27 DIAGNOSIS — Z794 Long term (current) use of insulin: Secondary | ICD-10-CM | POA: Diagnosis not present

## 2022-10-27 DIAGNOSIS — I10 Essential (primary) hypertension: Secondary | ICD-10-CM | POA: Diagnosis not present

## 2022-10-27 MED ORDER — ACETAMINOPHEN 500 MG PO TABS
1000.0000 mg | ORAL_TABLET | Freq: Once | ORAL | Status: AC
  Administered 2022-10-27: 1000 mg via ORAL
  Filled 2022-10-27: qty 2

## 2022-10-27 MED ORDER — AMOXICILLIN 500 MG PO CAPS
500.0000 mg | ORAL_CAPSULE | Freq: Once | ORAL | Status: AC
  Administered 2022-10-27: 500 mg via ORAL
  Filled 2022-10-27: qty 1

## 2022-10-27 MED ORDER — TRAMADOL HCL 50 MG PO TABS: 50.0000 mg | ORAL_TABLET | Freq: Four times a day (QID) | ORAL | 0 refills | Status: DC | PRN

## 2022-10-27 MED ORDER — AMOXICILLIN 500 MG PO CAPS: 500.0000 mg | ORAL_CAPSULE | Freq: Three times a day (TID) | ORAL | 0 refills | Status: DC

## 2022-10-27 NOTE — ED Provider Notes (Signed)
[place] AT [facility] Provider Note   CSN: [id number] Arrival date & time: 10/27/22  2123     History  Chief Complaint  Patient presents with   Dental Pain    Michael Foster is a 47 y.o. male.  Pt c/o left lower dental pain in the past 1-2 days. Symptoms acute onset, moderate, persistent, indicates it is left posterior molar. No sore throat or trouble breathing or swallowing. No neck pain or swelling. No fever or chills. Took ibuprofen w partial relief.   The history is provided by the patient and medical records.  Dental Pain Associated symptoms: no fever, no headaches and no neck pain        Home Medications Prior to Admission medications   Medication Sig Start Date End Date Taking? Authorizing Provider  albuterol (VENTOLIN HFA) 108 (90 Base) MCG/ACT inhaler TAKE 2 PUFFS BY MOUTH EVERY 6 HOURS AS NEEDED FOR WHEEZE OR SHORTNESS OF BREATH 06/13/22   Gerrit Heck, MD  aspirin EC 81 MG tablet Take 81 mg by mouth daily.    [provider]  Blood Glucose Monitoring Suppl ([place]) w/Device KIT 1 Units by Does not apply route daily. Patient not taking: Reported on 08/28/2022 08/29/20   Matilde Haymaker, MD  Continuous Blood Gluc Sensor (DEXCOM G7 SENSOR) MISC Replace sensor every 10 days. 02/08/22   Leavy Cella, RPH-CPP  glucose blood (ONETOUCH VERIO) test strip Use as instructed Patient not taking: Reported on 08/28/2022 08/29/20   Matilde Haymaker, MD  insulin aspart (FIASP) 100 UNIT/ML FlexTouch Pen Inject 10-20 Units into the skin 2 (two) times daily before a meal. 10/02/22   Hensel, Jamal Collin, MD  insulin glargine, 2 Unit Dial, (TOUJEO MAX SOLOSTAR) 300 UNIT/ML Solostar Pen Inject 50 Units into the skin daily. 10/02/22   Zenia Resides, MD  Insulin Pen Needle (B-D ULTRAFINE III SHORT PEN) 31G X 8 MM MISC USE AS DIRECTED ONCE DAILY 03/27/22   Gerrit Heck, MD  Lancet Devices (ONE TOUCH DELICA LANCING DEV) MISC 1 Device by  Does not apply route daily. 08/29/20   Matilde Haymaker, MD  lisinopril (ZESTRIL) 2.5 MG tablet Take 1 tablet (2.5 mg total) by mouth daily. 09/05/22   Gerrit Heck, MD  Melatonin 10 MG TABS Take 10 mg by mouth at bedtime.    [provider]  metFORMIN (GLUCOPHAGE) 500 MG tablet Take 1 tablet (500 mg total) by mouth 2 (two) times daily with a meal. 03/21/22   Gerrit Heck, MD  Omega-3 Fatty Acids (FISH OIL) 1000 MG CAPS Take 1 capsule by mouth daily.    [provider]  rosuvastatin (CRESTOR) 5 MG tablet TAKE 1 TABLET BY MOUTH EVERY DAY 11/22/21   Gerrit Heck, MD  sertraline (ZOLOFT) 50 MG tablet TAKE 1 TABLET BY MOUTH EVERY DAY 08/06/22   Gerrit Heck, MD      Allergies    Semaglutide and Slo-bid gyrocaps [theophylline]    Review of Systems   Review of Systems  Constitutional:  Negative for fever.  HENT:  Positive for dental problem. Negative for sore throat and trouble swallowing.   Musculoskeletal:  Negative for neck pain and neck stiffness.  Neurological:  Negative for headaches.    Physical Exam Updated Vital Signs BP (!) 195/103 (BP Location: Right Arm)   Pulse 85   Temp 98.4 F (36.9 C) (Oral)   Resp 18   SpO2 97%  Physical Exam Vitals and nursing note reviewed.  Constitutional:  Appearance: Normal appearance. He is well-developed.  HENT:     Head: Atraumatic.     Nose: Nose normal.     Mouth/Throat:     Mouth: Mucous membranes are moist.     Pharynx: Oropharynx is clear. No oropharyngeal exudate or posterior oropharyngeal erythema.     Comments: Left posterior dental tenderness, ?mild gum swelling/tenderness. No fluctuance or drainable abscess noted. No trismus. Pharynx normal.  Eyes:     General: No scleral icterus.    Conjunctiva/sclera: Conjunctivae normal.     Pupils: Pupils are equal, round, and reactive to light.  Neck:     Trachea: No tracheal deviation.  Cardiovascular:     Rate and Rhythm: Normal rate.  Pulmonary:      Effort: Pulmonary effort is normal. No accessory muscle usage or respiratory distress.     Breath sounds: No stridor.  Musculoskeletal:        General: No swelling.     Cervical back: Normal range of motion and neck supple. No rigidity.  Lymphadenopathy:     Cervical: No cervical adenopathy.  Skin:    General: Skin is warm and dry.     Findings: No rash.  Neurological:     Mental Status: He is alert.     Comments: Alert, speech clear.   Psychiatric:        Mood and Affect: Mood normal.     ED Results / Procedures / Treatments   Labs (all labs ordered are listed, but only abnormal results are displayed) Labs Reviewed - No data to display  EKG None  Radiology No results found.  Procedures Procedures    Medications Ordered in ED Medications  acetaminophen (TYLENOL) tablet 1,000 mg (has no administration in time range)  amoxicillin (AMOXIL) capsule 500 mg (has no administration in time range)    ED Course/ Medical Decision Making/ A&P                             Medical Decision Making Problems Addressed: Dental abscess: acute illness or injury Elevated blood pressure reading: acute illness or injury Essential hypertension: chronic illness or injury with exacerbation, progression, or side effects of treatment that poses a threat to life or bodily functions Pain, dental: acute illness or injury  Amount and/or Complexity of Data Reviewed External Data Reviewed: notes.  Risk OTC drugs. Prescription drug management.   Confirmed no antibiotic allergies. Acetaminophen po, amox po.  Pt w no current dentist - rec close outpatient dental f/u.  Also rec pcp f/u re elevated bp.   Recheck bp 180/90. Pt indicates has adequate bp meds at home, and has pcp with whom he can f/u closely. Indicates normally it is fairly well controlled, but possibly high due to pain. Denies headache or neuro c/o, no cp or sob, no swelling - states other than dental issue feels at baseline.    Pt currently appears stable for d/c.   Return precautions provided.           Final Clinical Impression(s) / ED Diagnoses Final diagnoses:  None    Rx / DC Orders ED Discharge Orders     None         Lajean Saver, MD 10/27/22 2211

## 2022-10-27 NOTE — Discharge Instructions (Addendum)
It was our pleasure to provide your ER care today - we hope that you feel better.  For dental issue/pain, take amoxicillin as prescribed, take acetaminophen or ibuprofen as need for pain. You may also take ultram as need for pain - no driving when taking.  Follow up closely with dentist in the next few days - see attached info, call tomorrow AM to arrange appointment.   Your blood pressure is high - continue your blood pressure med, limit salt intake, follow heart health eating plan, and follow up closely with primary care doctor in one week.  Return to ER if worse, new symptoms, fevers, severe or intractable pain, facial swelling, trouble breathing or swallowing, or other concern.

## 2022-10-27 NOTE — ED Triage Notes (Signed)
Pt c/o left lower dental pain since last night.

## 2022-11-03 ENCOUNTER — Other Ambulatory Visit: Payer: Self-pay | Admitting: Student

## 2022-12-02 NOTE — Progress Notes (Deleted)
    SUBJECTIVE:   CHIEF COMPLAINT / HPI:   Diabetic Follow Up: Patient is a 47 y.o. male who present today for diabetic follow up.   Patient endorses {rwdmsmartlistproblems:24882}  Home medications include: *** ACEi/ARB: {yes/no/default E/Y:81448::"JEH applicable"} Statin: {yes/no/default U/D:14970::"YOV applicable"} Patient endorses taking these medications as prescribed.***  Most recent A1Cs:  Lab Results  Component Value Date   HGBA1C 11.5 (A) 07/25/2022   HGBA1C 13.3 (A) 08/27/2021   HGBA1C 14.3 (A) 08/29/2020   Last Microalbumin, LDL, Creatinine: Lab Results  Component Value Date   MICROALBUR 7.9 (H) 01/04/2015   LDLCALC 93 08/27/2021   CREATININE 0.57 (L) 06/21/2022   Patient {rwdoesdoesnot:24881} check blood glucose on a regular basis.  Patient {rwisisnot:24883} up to date on diabetic eye. Patient {rwisisnot:24883} up to date on diabetic foot exam.  PERTINENT  PMH / PSH: ***  OBJECTIVE:   There were no vitals taken for this visit.  ***  ASSESSMENT/PLAN:   No problem-specific Assessment & Plan notes found for this encounter.     Gerrit Heck, MD [place]

## 2022-12-06 ENCOUNTER — Ambulatory Visit: Payer: BC Managed Care – PPO | Admitting: Student

## 2022-12-06 ENCOUNTER — Other Ambulatory Visit: Payer: Self-pay | Admitting: Student

## 2022-12-06 DIAGNOSIS — J4520 Mild intermittent asthma, uncomplicated: Secondary | ICD-10-CM

## 2022-12-09 ENCOUNTER — Encounter: Payer: Self-pay | Admitting: Student

## 2022-12-10 ENCOUNTER — Other Ambulatory Visit: Payer: Self-pay

## 2022-12-10 ENCOUNTER — Emergency Department (HOSPITAL_COMMUNITY)
Admission: EM | Admit: 2022-12-10 | Discharge: 2022-12-10 | Disposition: A | Discharge status: Home / Self Care | Payer: BC Managed Care – PPO | Attending: Emergency Medicine | Admitting: Emergency Medicine

## 2022-12-10 ENCOUNTER — Encounter (HOSPITAL_COMMUNITY): Payer: Self-pay

## 2022-12-10 DIAGNOSIS — Z7982 Long term (current) use of aspirin: Secondary | ICD-10-CM | POA: Insufficient documentation

## 2022-12-10 DIAGNOSIS — Z98818 Other dental procedure status: Secondary | ICD-10-CM | POA: Insufficient documentation

## 2022-12-10 DIAGNOSIS — K0889 Other specified disorders of teeth and supporting structures: Secondary | ICD-10-CM | POA: Diagnosis not present

## 2022-12-10 DIAGNOSIS — K08409 Partial loss of teeth, unspecified cause, unspecified class: Secondary | ICD-10-CM

## 2022-12-10 DIAGNOSIS — Z794 Long term (current) use of insulin: Secondary | ICD-10-CM | POA: Diagnosis not present

## 2022-12-10 MED ORDER — AMOXICILLIN-POT CLAVULANATE 875-125 MG PO TABS: 1.0000 | ORAL_TABLET | Freq: Two times a day (BID) | ORAL | 0 refills | Status: DC

## 2022-12-10 NOTE — ED Provider Notes (Cosign Needed Addendum)
[place] EMERGENCY DEPARTMENT AT [facility] Provider Note   CSN: [id number] Arrival date & time: 12/10/22  2108     History Chief Complaint  Patient presents with   Dental Pain    Michael Foster is a 47 y.o. male.  Patient presents to the emergency department complaints of dental pain.  He reports he was seen here in the emergency department several weeks ago for dental pain on a cracked tooth of his right lower jaw.  He reports that he was seen by dentist afterwards and had tooth extracted.  He reports that since then, the healing has progressed somewhat as expected but is now reported that he feels that he is having some abnormal taste and some drainage coming from the site of the tooth extraction.  Also reports that he has noticed that the area from the extracted tooth feels like it is somewhat itchy.  He reports that during his last tooth extraction he was on amoxicillin simultaneously and denied any issues.  He called his dentist office and discussed possibly having amoxicillin or other antibiotic prescribed for him but the dentist did not advise the need for any antibiotics at that time.  Denies any chest pain, shortness of breath, abdominal pain, nausea, vomiting, diarrhea.  Reports that pain is not significantly worsened and still responding well to Tylenol/ibuprofen as needed.  Dental Pain      Home Medications Prior to Admission medications   Medication Sig Start Date End Date Taking? Authorizing Provider  amoxicillin-clavulanate (AUGMENTIN) 875-125 MG tablet Take 1 tablet by mouth every 12 (twelve) hours. 12/10/22  Yes Lourdes Sledge A, PA-C  albuterol (VENTOLIN HFA) 108 (90 Base) MCG/ACT inhaler INHALE 2 PUFFS BY MOUTH EVERY 6 HOURS AS NEEDED FOR WHEEZE OR SHORTNESS OF BREATH 12/06/22   Gerrit Heck, MD  amoxicillin (AMOXIL) 500 MG capsule Take 1 capsule (500 mg total) by mouth 3 (three) times daily. 10/27/22   Lajean Saver, MD  aspirin EC 81 MG tablet Take 81 mg  by mouth daily.    [provider]  B-D ULTRAFINE III SHORT PEN 31G X 8 MM MISC USE AS DIRECTED ONCE DAILY 11/04/22   Gerrit Heck, MD  Blood Glucose Monitoring Suppl ([place]) w/Device KIT 1 Units by Does not apply route daily. Patient not taking: Reported on 08/28/2022 08/29/20   Matilde Haymaker, MD  Continuous Blood Gluc Sensor (DEXCOM G7 SENSOR) MISC Replace sensor every 10 days. 02/08/22   Leavy Cella, RPH-CPP  glucose blood (ONETOUCH VERIO) test strip Use as instructed Patient not taking: Reported on 08/28/2022 08/29/20   Matilde Haymaker, MD  insulin aspart (FIASP) 100 UNIT/ML FlexTouch Pen Inject 10-20 Units into the skin 2 (two) times daily before a meal. 10/02/22   Hensel, Jamal Collin, MD  insulin glargine, 2 Unit Dial, (TOUJEO MAX SOLOSTAR) 300 UNIT/ML Solostar Pen Inject 50 Units into the skin daily. 10/02/22   Zenia Resides, MD  Lancet Devices (ONE TOUCH DELICA LANCING DEV) MISC 1 Device by Does not apply route daily. 08/29/20   Matilde Haymaker, MD  lisinopril (ZESTRIL) 2.5 MG tablet Take 1 tablet (2.5 mg total) by mouth daily. 09/05/22   Gerrit Heck, MD  Melatonin 10 MG TABS Take 10 mg by mouth at bedtime.    [provider]  metFORMIN (GLUCOPHAGE) 500 MG tablet Take 1 tablet (500 mg total) by mouth 2 (two) times daily with a meal. 03/21/22   Gerrit Heck, MD  Omega-3 Fatty Acids ([place])  1000 MG CAPS Take 1 capsule by mouth daily.    [provider]  rosuvastatin (CRESTOR) 5 MG tablet TAKE 1 TABLET BY MOUTH EVERY DAY 11/22/21   Gerrit Heck, MD  sertraline (ZOLOFT) 50 MG tablet TAKE 1 TABLET BY MOUTH EVERY DAY 08/06/22   Gerrit Heck, MD  traMADol (ULTRAM) 50 MG tablet Take 1 tablet (50 mg total) by mouth every 6 (six) hours as needed. 10/27/22   Lajean Saver, MD      Allergies    Semaglutide and Slo-bid gyrocaps [theophylline]    Review of Systems   Review of Systems  HENT:  Positive for dental problem.   All other systems  reviewed and are negative.   Physical Exam Updated Vital Signs BP (!) 220/88   Pulse 94   Temp 98 F (36.7 C) (Oral)   Resp 18   Ht 5\' 9"  (1.753 m)   Wt (!) 146.1 kg   SpO2 96%   BMI 47.55 kg/m  Physical Exam Vitals and nursing note reviewed.  Constitutional:      Appearance: Normal appearance.  HENT:     Head: Normocephalic and atraumatic.     Comments: No appreciable jaw swelling, erythema noted on exam    Mouth/Throat:     Mouth: Mucous membranes are moist.     Dentition: Dental tenderness present.      Comments: Notable recent tooth extraction of right lower jaw. Some erythema in the area with some evidence of a white/yellow substance that does not appear to be obviously purulent. Cardiovascular:     Rate and Rhythm: Normal rate and regular rhythm.  Pulmonary:     Effort: Pulmonary effort is normal.     Breath sounds: Normal breath sounds.  Neurological:     Mental Status: He is alert.     ED Results / Procedures / Treatments   Labs (all labs ordered are listed, but only abnormal results are displayed) Labs Reviewed - No data to display  EKG None  Radiology No results found.  Procedures Procedures   Medications Ordered in ED Medications - No data to display  ED Course/ Medical Decision Making/ A&P                           Medical Decision Making Risk Prescription drug management.   This patient presents to the ED for concern of dental pain.  Differential diagnosis includes dental abscess, gingivitis, dry socket   Problem List / ED Course:  Patient presents emergency department complaints of dental pain.  He reports that he was seen by his dentist recently and had a tooth extracted on the right lower side of his mouth.  He reports that he also recently had another dental extraction on the left lower side of his jaw but feels that the right side is not healing the same as the left side he feels that he is noticing a off tasting discharge coming  from the right side.  On my examination, there is not appear to be any obvious or significant erythema in the area but there may be some minor swelling but this is likely part of the normal healing process.  Given that patient is having some drainage and discharge from the area that is off tasting and is not having as quick of improvement in pain compared to the left side, I am concerned for possible infection in this area given that he was not on any antibiotic prior  to this tooth being extracted or afterwards.  I will send a prescription for Augmentin for patient to take for the next several days to hopefully address this likely infection in his tooth.  Will also advised patient to follow-up with his dentist for further evaluation to ensure that the area is healing properly.  Patient agreeable to treatment plan verbalized understanding all return precautions. Patient's blood pressure is elevated on initial reading on reassessment.  He is currently asymptomatic at this time.  Encourage patient to follow with primary care provider for further evaluation and treatment of hypertension as this is his persistent level.  Final Clinical Impression(s) / ED Diagnoses Final diagnoses:  Pain, dental  History of tooth extraction, unspecified edentulism class    Rx / DC Orders ED Discharge Orders          Ordered    amoxicillin-clavulanate (AUGMENTIN) 875-125 MG tablet  Every 12 hours        12/10/22 2245               Luvenia Heller, PA-C 12/10/22 2329    Elgie Congo, MD 12/12/22 1101

## 2022-12-10 NOTE — ED Triage Notes (Signed)
Right lower dental pain after removal of tooth last week. Pt states he did not get any abx after removal and he is concerned for infection due to itching where tooth was. He also feels like it is swollen and it has been painful. Denies body aches, fever, chills

## 2022-12-10 NOTE — Discharge Instructions (Addendum)
You are seen in the emergency department for concerns following a dental extraction.  There is some moderate concern for possible infection in the area with a appear to have some drainage coming from the site there is no significant erythema which would increase my suspicion for infection.  I however have gone ahead and sent a prescription for Augmentin to your pharmacy given that you recently taking amoxicillin.  You should plan to follow-up with your dentist for further evaluation to ensure that this area is healing properly.

## 2023-01-05 ENCOUNTER — Other Ambulatory Visit: Payer: Self-pay | Admitting: Student

## 2023-01-05 DIAGNOSIS — I10 Essential (primary) hypertension: Secondary | ICD-10-CM

## 2023-02-27 ENCOUNTER — Emergency Department (HOSPITAL_BASED_OUTPATIENT_CLINIC_OR_DEPARTMENT_OTHER): Payer: BC Managed Care – PPO

## 2023-02-27 ENCOUNTER — Encounter (HOSPITAL_COMMUNITY): Payer: Self-pay

## 2023-02-27 ENCOUNTER — Emergency Department (HOSPITAL_COMMUNITY)
Admission: EM | Admit: 2023-02-27 | Discharge: 2023-02-27 | Disposition: A | Discharge status: Home / Self Care | Payer: BC Managed Care – PPO | Attending: Emergency Medicine | Admitting: Emergency Medicine

## 2023-02-27 ENCOUNTER — Other Ambulatory Visit: Payer: Self-pay

## 2023-02-27 DIAGNOSIS — Z7982 Long term (current) use of aspirin: Secondary | ICD-10-CM | POA: Insufficient documentation

## 2023-02-27 DIAGNOSIS — J45909 Unspecified asthma, uncomplicated: Secondary | ICD-10-CM | POA: Insufficient documentation

## 2023-02-27 DIAGNOSIS — M79661 Pain in right lower leg: Secondary | ICD-10-CM | POA: Diagnosis not present

## 2023-02-27 DIAGNOSIS — Z794 Long term (current) use of insulin: Secondary | ICD-10-CM | POA: Insufficient documentation

## 2023-02-27 DIAGNOSIS — Z7984 Long term (current) use of oral hypoglycemic drugs: Secondary | ICD-10-CM | POA: Diagnosis not present

## 2023-02-27 DIAGNOSIS — M79604 Pain in right leg: Secondary | ICD-10-CM | POA: Diagnosis not present

## 2023-02-27 DIAGNOSIS — I10 Essential (primary) hypertension: Secondary | ICD-10-CM | POA: Diagnosis not present

## 2023-02-27 DIAGNOSIS — E119 Type 2 diabetes mellitus without complications: Secondary | ICD-10-CM | POA: Diagnosis not present

## 2023-02-27 LAB — CBC WITH DIFFERENTIAL/PLATELET
Abs Immature Granulocytes: 0.06 10*3/uL (ref 0.00–0.07)
Basophils Absolute: 0.1 10*3/uL (ref 0.0–0.1)
Basophils Relative: 1 %
Eosinophils Absolute: 0.4 10*3/uL (ref 0.0–0.5)
Eosinophils Relative: 5 %
HCT: 44.2 % (ref 39.0–52.0)
Hemoglobin: 14.1 g/dL (ref 13.0–17.0)
Immature Granulocytes: 1 %
Lymphocytes Relative: 33 %
Lymphs Abs: 3.1 10*3/uL (ref 0.7–4.0)
MCH: 26.6 pg (ref 26.0–34.0)
MCHC: 31.9 g/dL (ref 30.0–36.0)
MCV: 83.2 fL (ref 80.0–100.0)
Monocytes Absolute: 0.7 10*3/uL (ref 0.1–1.0)
Monocytes Relative: 7 %
Neutro Abs: 4.9 10*3/uL (ref 1.7–7.7)
Neutrophils Relative %: 53 %
Platelets: 358 10*3/uL (ref 150–400)
RBC: 5.31 MIL/uL (ref 4.22–5.81)
RDW: 13.1 % (ref 11.5–15.5)
WBC: 9.2 10*3/uL (ref 4.0–10.5)
nRBC: 0 % (ref 0.0–0.2)

## 2023-02-27 LAB — BASIC METABOLIC PANEL
Anion gap: 11 (ref 5–15)
BUN: 16 mg/dL (ref 6–20)
CO2: 26 mmol/L (ref 22–32)
Calcium: 9.6 mg/dL (ref 8.9–10.3)
Chloride: 99 mmol/L (ref 98–111)
Creatinine, Ser: 0.7 mg/dL (ref 0.61–1.24)
GFR, Estimated: >60 mL/min (ref >60)
Glucose, Bld: 345 mg/dL — ABNORMAL HIGH (ref 70–99)
Potassium: 4.2 mmol/L (ref 3.5–5.1)
Sodium: 136 mmol/L (ref 135–145)

## 2023-02-27 NOTE — ED Provider Notes (Signed)
[place] EMERGENCY DEPARTMENT AT [facility] Provider Note   CSN: [id number] Arrival date & time: 02/27/23  1537     History  Chief Complaint  Patient presents with   Leg Pain    Saif Peter is a 47 y.o. male with a past medical history significant for diabetes, history of TIA, asthma, hypertension, and PUD who presents to the ED due to right lower extremity pain.  Patient states he felt like he was having a cramp in his right lower extremity.  He admits to continued pain in his calf.  Denies edema.  No history of blood clots, recent surgeries, recent long immobilizations, or hormonal treatments.  Denies chest pain or shortness of breath.  Patient is concerned about a possible blood clot in his leg.  Patient also notes his glucose was elevated today after eating something with a significant amount of sugar.  He took extra insulin to control his glucose earlier today.  Typical glucose runs in the 250s.  History obtained from patient and past medical records. No interpreter used during encounter.       Home Medications Prior to Admission medications   Medication Sig Start Date End Date Taking? Authorizing Provider  albuterol (VENTOLIN HFA) 108 (90 Base) MCG/ACT inhaler INHALE 2 PUFFS BY MOUTH EVERY 6 HOURS AS NEEDED FOR WHEEZE OR SHORTNESS OF BREATH 12/06/22   Levin Erp, MD  amoxicillin (AMOXIL) 500 MG capsule Take 1 capsule (500 mg total) by mouth 3 (three) times daily. 10/27/22   Cathren Laine, MD  amoxicillin-clavulanate (AUGMENTIN) 875-125 MG tablet Take 1 tablet by mouth every 12 (twelve) hours. 12/10/22   Smitty Knudsen, PA-C  aspirin EC 81 MG tablet Take 81 mg by mouth daily.    [provider]  B-D ULTRAFINE III SHORT PEN 31G X 8 MM MISC USE AS DIRECTED ONCE DAILY 11/04/22   Levin Erp, MD  Blood Glucose Monitoring Suppl (ONETOUCH VERIO FLEX SYSTEM) w/Device KIT 1 Units by Does not apply route daily. Patient not taking: Reported on 08/28/2022  08/29/20   Mirian Mo, MD  Continuous Blood Gluc Sensor (DEXCOM G7 SENSOR) MISC Replace sensor every 10 days. 02/08/22   Kathrin Ruddy, RPH-CPP  glucose blood (ONETOUCH VERIO) test strip Use as instructed Patient not taking: Reported on 08/28/2022 08/29/20   Mirian Mo, MD  insulin aspart (FIASP) 100 UNIT/ML FlexTouch Pen Inject 10-20 Units into the skin 2 (two) times daily before a meal. 10/02/22   Hensel, Santiago Bumpers, MD  insulin glargine, 2 Unit Dial, (TOUJEO MAX SOLOSTAR) 300 UNIT/ML Solostar Pen Inject 50 Units into the skin daily. 10/02/22   Moses Manners, MD  Lancet Devices (ONE TOUCH DELICA LANCING DEV) MISC 1 Device by Does not apply route daily. 08/29/20   Mirian Mo, MD  lisinopril (ZESTRIL) 2.5 MG tablet TAKE 1 TABLET BY MOUTH EVERY DAY 01/06/23   Levin Erp, MD  Melatonin 10 MG TABS Take 10 mg by mouth at bedtime.    [provider]  metFORMIN (GLUCOPHAGE) 500 MG tablet Take 1 tablet (500 mg total) by mouth 2 (two) times daily with a meal. 03/21/22   Levin Erp, MD  Omega-3 Fatty Acids (FISH OIL) 1000 MG CAPS Take 1 capsule by mouth daily.    [provider]  rosuvastatin (CRESTOR) 5 MG tablet TAKE 1 TABLET BY MOUTH EVERY DAY 11/22/21   Levin Erp, MD  sertraline (ZOLOFT) 50 MG tablet TAKE 1 TABLET BY MOUTH EVERY DAY 08/06/22   Levin Erp,  MD  traMADol (ULTRAM) 50 MG tablet Take 1 tablet (50 mg total) by mouth every 6 (six) hours as needed. 10/27/22   Cathren Laine, MD      Allergies    Semaglutide and Slo-bid gyrocaps [theophylline]    Review of Systems   Review of Systems  Respiratory:  Negative for shortness of breath.   Cardiovascular:  Negative for chest pain.  Musculoskeletal:  Positive for arthralgias and myalgias. Negative for back pain and gait problem.    Physical Exam Updated Vital Signs BP (!) 173/101 (BP Location: Right Arm)   Pulse 84   Temp 98.8 F (37.1 C) (Oral)   Resp 20   Ht 5\' 9"  (1.753 m)   Wt (!) 147.4 kg    SpO2 96%   BMI 47.99 kg/m  Physical Exam Vitals and nursing note reviewed.  Constitutional:      General: He is not in acute distress.    Appearance: He is not ill-appearing.  HENT:     Head: Normocephalic.  Eyes:     Pupils: Pupils are equal, round, and reactive to light.  Cardiovascular:     Rate and Rhythm: Normal rate and regular rhythm.     Pulses: Normal pulses.     Heart sounds: Normal heart sounds. No murmur heard.    No friction rub. No gallop.  Pulmonary:     Effort: Pulmonary effort is normal.     Breath sounds: Normal breath sounds.  Abdominal:     General: Abdomen is flat. There is no distension.     Palpations: Abdomen is soft.     Tenderness: There is no abdominal tenderness. There is no guarding or rebound.  Musculoskeletal:        General: Normal range of motion.     Cervical back: Neck supple.     Comments: TTP throughout right calf  Skin:    General: Skin is warm and dry.  Neurological:     General: No focal deficit present.     Mental Status: He is alert.  Psychiatric:        Mood and Affect: Mood normal.        Behavior: Behavior normal.     ED Results / Procedures / Treatments   Labs (all labs ordered are listed, but only abnormal results are displayed) Labs Reviewed  BASIC METABOLIC PANEL - Abnormal; Notable for the following components:      Result Value   Glucose, Bld 345 (*)    All other components within normal limits  CBC WITH DIFFERENTIAL/PLATELET    EKG None  Radiology VAS [place] LOWER EXTREMITY VENOUS (DVT) (7a-7p)  Result Date: 02/27/2023  Lower Venous DVT Study Patient Name:  AMAURION FEITH  Date of Exam:   02/27/2023 Medical Rec #: [id number]      Accession #:    [id number] Date of Birth: 02-18-1976      Patient Gender: M Patient Age:   16 years Exam Location:  [facility] Procedure:      VAS [place] LOWER EXTREMITY VENOUS (DVT) Referring Phys: Claudette Stapler  --------------------------------------------------------------------------------  Indications: Sharp right leg pain x1 hour, patient concerned for blood clot.  Limitations: Body habitus. Comparison Study: No prior studies. Performing Technologist: Sherren Kerns RVS  Examination Guidelines: A complete evaluation includes B-mode imaging, spectral Doppler, color Doppler, and power Doppler as needed of all accessible portions of each vessel. Bilateral testing is considered an integral part of a complete examination. Limited examinations for reoccurring indications may be  performed as noted. The reflux portion of the exam is performed with the patient in reverse Trendelenburg.  +---------+---------------+---------+-----------+----------+--------------+ RIGHT    CompressibilityPhasicitySpontaneityPropertiesThrombus Aging +---------+---------------+---------+-----------+----------+--------------+ CFV      Full           Yes      Yes                                 +---------+---------------+---------+-----------+----------+--------------+ SFJ      Full                                                        +---------+---------------+---------+-----------+----------+--------------+ FV Prox  Full                                                        +---------+---------------+---------+-----------+----------+--------------+ FV Mid   Full           Yes      Yes                                 +---------+---------------+---------+-----------+----------+--------------+ FV DistalFull                                                        +---------+---------------+---------+-----------+----------+--------------+ PFV      Full                                                        +---------+---------------+---------+-----------+----------+--------------+ POP      Full                                                         +---------+---------------+---------+-----------+----------+--------------+ PTV      Full                                                        +---------+---------------+---------+-----------+----------+--------------+ PERO     Full                                                        +---------+---------------+---------+-----------+----------+--------------+ Gastroc  Full                                                        +---------+---------------+---------+-----------+----------+--------------+   +----+---------------+---------+-----------+----------+--------------+  LEFTCompressibilityPhasicitySpontaneityPropertiesThrombus Aging +----+---------------+---------+-----------+----------+--------------+ CFV Full           Yes      Yes                                 +----+---------------+---------+-----------+----------+--------------+    Summary: RIGHT: - There is no evidence of deep vein thrombosis in the lower extremity.  - No cystic structure found in the popliteal fossa.  LEFT: - No evidence of common femoral vein obstruction.  *See table(s) above for measurements and observations.    Preliminary     Procedures Procedures    Medications Ordered in ED Medications - No data to display  ED Course/ Medical Decision Making/ A&P                             Medical Decision Making Amount and/or Complexity of Data Reviewed Labs: ordered. Decision-making details documented in ED Course.   This patient presents to the ED for concern of right leg pain, this involves an extensive number of treatment options, and is a complaint that carries with it a high risk of complications and morbidity.  The differential diagnosis includes DVT, muscle cramp, dehydration, etc  47 year old male presents to the ED due to right lower extremity pain.  Patient states he is concerned he may have a blood clot in his right leg.  No history of blood clots, recent surgeries, recent long  immobilizations, or medical treatments.  Denies chest pain and shortness of breath.  Patient also concerned that he had a hyperglycemic episode earlier today after eating something high in sugar.  Currently on insulin.  Upon arrival patient afebrile, not tachycardic or hypoxic.  Elevated BP at 173/101. Asymptomatic. Low suspicion for hypertensive emergency or urgency. Patient in no acute distress.  Mild tenderness to right calf.  Slight edema bilaterally around sock line. [place] to rule out DVT. Routine labs ordered to rule out electrolyte abnormalities and to check glucose.  CBC unremarkable.  No leukocytosis.  Normal hemoglobin.  BMP significant for hyperglycemia 345.  No anion gap.  Low suspicion for DKA.  Normal renal function.  No electrolyte abnormalities.  Shared decision making in regards to giving IV fluids or insulin to help with hyperglycemia however patient checked his glucose at bedside and it is decreasing and is now 300.  Patient feels comfortable taking his insulin at home and has the ability to monitor it.  Patient declined any insulin or IV fluids here in the ED.  Patient notes he will return if he is unable to control his glucose.  Low suspicion for DKA or HHS.  Ultrasound reviewed which is negative for DVT. Possible muscle cramping causing calf pain. Advised patient to keep an eye on glucose. Follow-up with PCP within 2-3 days for recheck of glucose. Strict ED precautions discussed with patient. Patient states understanding and agrees to plan. Patient discharged home in no acute distress and stable vitals  Has PCP HX DM       Final Clinical Impression(s) / ED Diagnoses Final diagnoses:  Right leg pain    Rx / DC Orders ED Discharge Orders     None         Jesusita Oka 02/27/23 1733    Lonell Grandchild, MD 03/06/23 [phone]

## 2023-02-27 NOTE — ED Triage Notes (Signed)
Pt c/o right lower leg pain about 1 hour pta. C/o sharp pain and states he couldn't walk on it right after. Pt was ambulatory to triage. No meds taken for pain at home. Pt states he is concerned for a blood clot.

## 2023-02-27 NOTE — Discharge Instructions (Signed)
It was a pleasure taking care of you today.  As discussed, your ultrasound did not show a blood clot.  Your labs are reassuring.  Your glucose was elevated. Please continue to monitor your glucose and take the appropriate amount of insulin. Return to the ER for new or worsening symptoms.

## 2023-02-27 NOTE — Progress Notes (Signed)
VASCULAR LAB    Right lower extremity venous duplex has been performed.  See CV proc for preliminary results.  Gave verbal report in Doc box  Feven Alderfer, RVT 02/27/2023, 5:10 PM

## 2023-03-05 ENCOUNTER — Other Ambulatory Visit: Payer: Self-pay | Admitting: Student

## 2023-03-05 DIAGNOSIS — I152 Hypertension secondary to endocrine disorders: Secondary | ICD-10-CM

## 2023-03-05 DIAGNOSIS — E1159 Type 2 diabetes mellitus with other circulatory complications: Secondary | ICD-10-CM

## 2023-03-28 ENCOUNTER — Other Ambulatory Visit: Payer: Self-pay | Admitting: Student

## 2023-03-28 DIAGNOSIS — J4520 Mild intermittent asthma, uncomplicated: Secondary | ICD-10-CM

## 2023-04-12 ENCOUNTER — Other Ambulatory Visit: Payer: Self-pay | Admitting: Student

## 2023-04-12 DIAGNOSIS — I10 Essential (primary) hypertension: Secondary | ICD-10-CM

## 2023-07-09 ENCOUNTER — Other Ambulatory Visit: Payer: Self-pay | Admitting: Student

## 2023-07-09 DIAGNOSIS — J4520 Mild intermittent asthma, uncomplicated: Secondary | ICD-10-CM

## 2023-07-27 ENCOUNTER — Emergency Department (HOSPITAL_BASED_OUTPATIENT_CLINIC_OR_DEPARTMENT_OTHER): Payer: BC Managed Care – PPO

## 2023-07-27 ENCOUNTER — Encounter (HOSPITAL_BASED_OUTPATIENT_CLINIC_OR_DEPARTMENT_OTHER): Payer: Self-pay

## 2023-07-27 ENCOUNTER — Emergency Department (HOSPITAL_BASED_OUTPATIENT_CLINIC_OR_DEPARTMENT_OTHER)
Admission: EM | Admit: 2023-07-27 | Discharge: 2023-07-27 | Disposition: A | Discharge status: Home / Self Care | Payer: BC Managed Care – PPO | Attending: Emergency Medicine | Admitting: Emergency Medicine

## 2023-07-27 ENCOUNTER — Other Ambulatory Visit: Payer: Self-pay

## 2023-07-27 DIAGNOSIS — R739 Hyperglycemia, unspecified: Secondary | ICD-10-CM

## 2023-07-27 DIAGNOSIS — E1165 Type 2 diabetes mellitus with hyperglycemia: Secondary | ICD-10-CM | POA: Diagnosis not present

## 2023-07-27 DIAGNOSIS — Z7984 Long term (current) use of oral hypoglycemic drugs: Secondary | ICD-10-CM | POA: Diagnosis not present

## 2023-07-27 DIAGNOSIS — K760 Fatty (change of) liver, not elsewhere classified: Secondary | ICD-10-CM | POA: Diagnosis not present

## 2023-07-27 DIAGNOSIS — R1011 Right upper quadrant pain: Secondary | ICD-10-CM | POA: Insufficient documentation

## 2023-07-27 DIAGNOSIS — Z79899 Other long term (current) drug therapy: Secondary | ICD-10-CM | POA: Diagnosis not present

## 2023-07-27 DIAGNOSIS — Z794 Long term (current) use of insulin: Secondary | ICD-10-CM | POA: Insufficient documentation

## 2023-07-27 DIAGNOSIS — I10 Essential (primary) hypertension: Secondary | ICD-10-CM | POA: Diagnosis not present

## 2023-07-27 DIAGNOSIS — R1010 Upper abdominal pain, unspecified: Secondary | ICD-10-CM

## 2023-07-27 DIAGNOSIS — Z7982 Long term (current) use of aspirin: Secondary | ICD-10-CM | POA: Insufficient documentation

## 2023-07-27 LAB — CBC
HCT: 43.8 % (ref 39.0–52.0)
Hemoglobin: 14.1 g/dL (ref 13.0–17.0)
MCH: 26.4 pg (ref 26.0–34.0)
MCHC: 32.2 g/dL (ref 30.0–36.0)
MCV: 81.9 fL (ref 80.0–100.0)
Platelets: 340 10*3/uL (ref 150–400)
RBC: 5.35 MIL/uL (ref 4.22–5.81)
RDW: 13.2 % (ref 11.5–15.5)
WBC: 9.7 10*3/uL (ref 4.0–10.5)
nRBC: 0 % (ref 0.0–0.2)

## 2023-07-27 LAB — COMPREHENSIVE METABOLIC PANEL
ALT: 20 U/L (ref 0–44)
AST: 14 U/L — ABNORMAL LOW (ref 15–41)
Albumin: 3.3 g/dL — ABNORMAL LOW (ref 3.5–5.0)
Alkaline Phosphatase: 123 U/L (ref 38–126)
Anion gap: 8 (ref 5–15)
BUN: 13 mg/dL (ref 6–20)
CO2: 28 mmol/L (ref 22–32)
Calcium: 8.8 mg/dL — ABNORMAL LOW (ref 8.9–10.3)
Chloride: 98 mmol/L (ref 98–111)
Creatinine, Ser: 0.74 mg/dL (ref 0.61–1.24)
GFR, Estimated: >60 mL/min (ref >60)
Glucose, Bld: 326 mg/dL — ABNORMAL HIGH (ref 70–99)
Potassium: 4.6 mmol/L (ref 3.5–5.1)
Sodium: 134 mmol/L — ABNORMAL LOW (ref 135–145)
Total Bilirubin: 1 mg/dL (ref 0.3–1.2)
Total Protein: 7.2 g/dL (ref 6.5–8.1)

## 2023-07-27 LAB — URINALYSIS, ROUTINE W REFLEX MICROSCOPIC
Bilirubin Urine: NEGATIVE
Glucose, UA: >=500 mg/dL — AB
Ketones, ur: NEGATIVE mg/dL
Leukocytes,Ua: NEGATIVE
Nitrite: NEGATIVE
Protein, ur: 100 mg/dL — AB
Specific Gravity, Urine: 1.02 (ref 1.005–1.030)
pH: 7 (ref 5.0–8.0)

## 2023-07-27 LAB — LIPASE, BLOOD: Lipase: 57 U/L — ABNORMAL HIGH (ref 11–51)

## 2023-07-27 LAB — URINALYSIS, MICROSCOPIC (REFLEX)

## 2023-07-27 MED ORDER — ONDANSETRON 4 MG PO TBDP: 4.0000 mg | ORAL_TABLET | Freq: Three times a day (TID) | ORAL | 0 refills | Status: DC | PRN

## 2023-07-27 MED ORDER — MORPHINE SULFATE (PF) 4 MG/ML IV SOLN
4.0000 mg | Freq: Once | INTRAVENOUS | Status: DC
Start: 2023-07-27 — End: 2023-07-27
  Filled 2023-07-27: qty 1

## 2023-07-27 MED ORDER — ALUM & MAG HYDROXIDE-SIMETH 200-200-20 MG/5ML PO SUSP
30.0000 mL | Freq: Once | ORAL | Status: AC
  Administered 2023-07-27: 30 mL via ORAL
  Filled 2023-07-27: qty 30

## 2023-07-27 MED ORDER — ONDANSETRON HCL 4 MG/2ML IJ SOLN
4.0000 mg | Freq: Once | INTRAMUSCULAR | Status: AC | PRN
  Administered 2023-07-27: 4 mg via INTRAVENOUS
  Filled 2023-07-27: qty 2

## 2023-07-27 NOTE — ED Triage Notes (Signed)
The patient is having abd pain and vomiting for a few days.

## 2023-07-27 NOTE — ED Notes (Signed)
Ultrasound at bedside

## 2023-07-27 NOTE — Discharge Instructions (Signed)
You have been evaluated for your symptoms.  Fortunately ultrasound today did not show any evidence of gallstone or inflamed gallbladder.  Your blood sugar is elevated today, please make sure to check your blood sugar and use your insulin as appropriate.  Take Zofran as needed for nausea.  You may continue to take antacid and omeprazole over-the-counter for your symptoms but if your pain returns or worsen do not hesitate to return to the ER for further assessment.

## 2023-07-27 NOTE — ED Provider Notes (Signed)
[place] [facility] Provider Note   CSN: [id number] Arrival date & time: 07/27/23  8295     History  Chief Complaint  Patient presents with   Abdominal Pain   Emesis    Michael Foster is a 47 y.o. male.  The history is provided by the patient and medical records. No language interpreter was used.  Abdominal Pain Associated symptoms: vomiting   Emesis Associated symptoms: abdominal pain      47 year old male significant history of morbid obesity, diabetes, hypertension, presenting with complaint of abdominal pain.  Patient report for the past 2 days he has had pain to his right upper quadrant of his abdomen.  Pain is described as sharp cramping intense worse with eating.  He endorsed nausea and did vomit once.  No diarrhea no constipation.  States pain felt somewhat similar to prior gastroenteritis.  Recently traveled from [place] and back.  Does not endorse any fever or chills no chest pain or shortness of breath no productive cough or hemoptysis no urinary discomfort no blood in his urine.  Denies alcohol or tobacco use.  Has a history of gallstones in the past.  He did try some over-the-counter antacid and PPI without adequate relief.  Home Medications Prior to Admission medications   Medication Sig Start Date End Date Taking? Authorizing Provider  albuterol (VENTOLIN HFA) 108 (90 Base) MCG/ACT inhaler INHALE 2 PUFFS BY MOUTH EVERY 6 HOURS AS NEEDED FOR WHEEZE OR SHORTNESS OF BREATH 07/09/23   Levin Erp, MD  amoxicillin (AMOXIL) 500 MG capsule Take 1 capsule (500 mg total) by mouth 3 (three) times daily. 10/27/22   Cathren Laine, MD  amoxicillin-clavulanate (AUGMENTIN) 875-125 MG tablet Take 1 tablet by mouth every 12 (twelve) hours. 12/10/22   Smitty Knudsen, PA-C  aspirin EC 81 MG tablet Take 81 mg by mouth daily.    [provider]  B-D ULTRAFINE III SHORT PEN 31G X 8 MM MISC USE AS DIRECTED ONCE DAILY 11/04/22   Levin Erp, MD  Blood Glucose Monitoring Suppl (ONETOUCH VERIO FLEX SYSTEM) w/Device KIT 1 Units by Does not apply route daily. Patient not taking: Reported on 08/28/2022 08/29/20   Mirian Mo, MD  Continuous Blood Gluc Sensor (DEXCOM G7 SENSOR) MISC Replace sensor every 10 days. 02/08/22   Kathrin Ruddy, RPH-CPP  glucose blood (ONETOUCH VERIO) test strip Use as instructed Patient not taking: Reported on 08/28/2022 08/29/20   Mirian Mo, MD  insulin aspart (FIASP) 100 UNIT/ML FlexTouch Pen Inject 10-20 Units into the skin 2 (two) times daily before a meal. 10/02/22   Hensel, Santiago Bumpers, MD  insulin glargine, 2 Unit Dial, (TOUJEO MAX SOLOSTAR) 300 UNIT/ML Solostar Pen Inject 50 Units into the skin daily. 10/02/22   Moses Manners, MD  Lancet Devices (ONE TOUCH DELICA LANCING DEV) MISC 1 Device by Does not apply route daily. 08/29/20   Mirian Mo, MD  lisinopril (ZESTRIL) 2.5 MG tablet TAKE 1 TABLET BY MOUTH EVERY DAY 04/14/23   Levin Erp, MD  Melatonin 10 MG TABS Take 10 mg by mouth at bedtime.    [provider]  metFORMIN (GLUCOPHAGE) 500 MG tablet TAKE 1 TABLET BY MOUTH 2 TIMES DAILY WITH A MEAL. 03/05/23   Levin Erp, MD  Omega-3 Fatty Acids (FISH OIL) 1000 MG CAPS Take 1 capsule by mouth daily.    [provider]  rosuvastatin (CRESTOR) 5 MG tablet TAKE 1 TABLET BY MOUTH EVERY DAY 11/22/21   Laroy Apple,  Mayuri, MD  sertraline (ZOLOFT) 50 MG tablet TAKE 1 TABLET BY MOUTH EVERY DAY 08/06/22   Levin Erp, MD  traMADol (ULTRAM) 50 MG tablet Take 1 tablet (50 mg total) by mouth every 6 (six) hours as needed. 10/27/22   Cathren Laine, MD      Allergies    Semaglutide and Slo-bid gyrocaps [theophylline]    Review of Systems   Review of Systems  Gastrointestinal:  Positive for abdominal pain and vomiting.  All other systems reviewed and are negative.   Physical Exam Updated Vital Signs BP (!) 190/87   Pulse 77   Temp 98 F (36.7 C) (Oral)   Resp 18   Ht 5\' 9"   (1.753 m)   Wt (!) 148 kg   SpO2 100%   BMI 48.18 kg/m  Physical Exam Vitals and nursing note reviewed.  Constitutional:      General: He is not in acute distress.    Appearance: He is well-developed. He is obese.  HENT:     Head: Atraumatic.  Eyes:     Conjunctiva/sclera: Conjunctivae normal.  Cardiovascular:     Rate and Rhythm: Normal rate and regular rhythm.  Pulmonary:     Effort: Pulmonary effort is normal.     Breath sounds: Normal breath sounds.  Abdominal:     Tenderness: There is abdominal tenderness in the right upper quadrant. There is guarding. There is no right CVA tenderness, left CVA tenderness or rebound. Positive signs include Murphy's sign. Negative signs include McBurney's sign.     Hernia: No hernia is present.  Musculoskeletal:     Cervical back: Neck supple.  Skin:    Findings: No rash.  Neurological:     Mental Status: He is alert.     ED Results / Procedures / Treatments   Labs (all labs ordered are listed, but only abnormal results are displayed) Labs Reviewed  LIPASE, BLOOD  COMPREHENSIVE METABOLIC PANEL  CBC  URINALYSIS, ROUTINE W REFLEX MICROSCOPIC    EKG None  Radiology No results found.  Procedures Procedures    Medications Ordered in ED Medications  ondansetron (ZOFRAN) injection 4 mg (has no administration in time range)    ED Course/ Medical Decision Making/ A&P                                 Medical Decision Making Amount and/or Complexity of Data Reviewed Labs: ordered. Radiology: ordered.  Risk OTC drugs. Prescription drug management.   BP (!) 190/87   Pulse 77   Temp 98 F (36.7 C) (Oral)   Resp 18   Ht 5\' 9"  (1.753 m)   Wt (!) 148 kg   SpO2 100%   BMI 48.18 kg/m   77:53 AM  47 year old male significant history of morbid obesity, diabetes, hypertension, presenting with complaint of abdominal pain.  Patient report for the past 2 days he has had pain to his right upper quadrant of his abdomen.  Pain  is described as sharp cramping intense worse with eating.  He endorsed nausea and did vomit once.  No diarrhea no constipation.  States pain felt somewhat similar to prior gastroenteritis.  Recently traveled from [place] and back.  Does not endorse any fever or chills no chest pain or shortness of breath no productive cough or hemoptysis no urinary discomfort no blood in his urine.  Denies alcohol or tobacco use.  Has a history of gallstones in the  past.  He did try some over-the-counter antacid and PPI without adequate relief.  Exam remarkable for tenderness to right upper quadrant with guarding but no rebound tenderness.  Positive Murphy sign.  Symptom concerning for acute cholecystitis.  Gastritis PUD certainly on the differential.  Low suspicion for pancreatitis or pneumonia.  Workup initiated.  Patient given opiate pain medication, antinausea medication and a GI cocktail for symptom control.  -Labs ordered, independently viewed and interpreted by me.  Labs remarkable for CBG 326, lipase 57 -The patient was maintained on a cardiac monitor.  I personally viewed and interpreted the cardiac monitored which showed an underlying rhythm of: NSR -Imaging independently viewed and interpreted by me and I agree with radiologist's interpretation.  Result remarkable for limited abd US showing no evidence of acute cholecystitis -This patient presents to the ED for concern of upper abd pain, this involves an extensive number of treatment options, and is a complaint that carries with it a high risk of complications and morbidity.  The differential diagnosis includes cholecystitis, gastritis, PUD, PNA, GERD, appendicitis, gastroenteritis -Co morbidities that complicate the patient evaluation includes PUD, DM, HTN -Treatment includes GI cocktail, morphine, zofran -Reevaluation of the patient after these medicines showed that the patient improved -PCP office notes or outside notes reviewed -Escalation to  admission/observation considered: patients feels much better, is comfortable with discharge, and will follow up with PCP -Prescription medication considered, patient comfortable with zofran -Social Determinant of Health considered  On reassessment patient states he feels much better after receiving GI cocktail and medication treatment.  We discussed options of obtaining abdominal pelvis CT scan to rule out of a intra-abdominal pathology but with improvement of symptoms normal white count and patient feeling better, will hold off on advanced imaging but patient was then to return if his symptoms progress.  Encourage patient to continue to take his antacid and PPI over-the-counter at home.  I also encourage patient to take his insulin because his blood sugar is elevated today.  Patient admits he did not take his insulin yesterday or today.         Final Clinical Impression(s) / ED Diagnoses Final diagnoses:  Upper abdominal pain  Hyperglycemia    Rx / DC Orders ED Discharge Orders          Ordered    ondansetron (ZOFRAN-ODT) 4 MG disintegrating tablet  Every 8 hours PRN        07/27/23 1041              Fayrene Helper, PA-C 07/27/23 1042    Terald Sleeper, MD 07/27/23 1306

## 2023-08-03 ENCOUNTER — Other Ambulatory Visit: Payer: Self-pay | Admitting: Student

## 2023-08-03 DIAGNOSIS — F32A Depression, unspecified: Secondary | ICD-10-CM

## 2023-08-07 ENCOUNTER — Other Ambulatory Visit: Payer: Self-pay | Admitting: Student

## 2023-08-09 ENCOUNTER — Other Ambulatory Visit: Payer: Self-pay | Admitting: Student

## 2023-08-09 DIAGNOSIS — F32A Depression, unspecified: Secondary | ICD-10-CM

## 2023-08-29 ENCOUNTER — Encounter: Payer: BC Managed Care – PPO | Admitting: Student

## 2023-08-29 ENCOUNTER — Encounter: Payer: BC Managed Care – PPO | Admitting: Family Medicine

## 2023-09-04 ENCOUNTER — Other Ambulatory Visit: Payer: Self-pay | Admitting: Student

## 2023-09-04 DIAGNOSIS — F32A Depression, unspecified: Secondary | ICD-10-CM

## 2023-09-15 DIAGNOSIS — F411 Generalized anxiety disorder: Secondary | ICD-10-CM | POA: Diagnosis not present

## 2023-09-15 DIAGNOSIS — I10 Essential (primary) hypertension: Secondary | ICD-10-CM | POA: Diagnosis not present

## 2023-09-15 DIAGNOSIS — E1165 Type 2 diabetes mellitus with hyperglycemia: Secondary | ICD-10-CM | POA: Diagnosis not present

## 2023-09-23 DIAGNOSIS — E1121 Type 2 diabetes mellitus with diabetic nephropathy: Secondary | ICD-10-CM | POA: Diagnosis not present

## 2023-09-23 DIAGNOSIS — E782 Mixed hyperlipidemia: Secondary | ICD-10-CM | POA: Diagnosis not present

## 2023-09-23 DIAGNOSIS — E1165 Type 2 diabetes mellitus with hyperglycemia: Secondary | ICD-10-CM | POA: Diagnosis not present

## 2023-09-23 DIAGNOSIS — I10 Essential (primary) hypertension: Secondary | ICD-10-CM | POA: Diagnosis not present

## 2023-09-23 DIAGNOSIS — Z0000 Encounter for general adult medical examination without abnormal findings: Secondary | ICD-10-CM | POA: Diagnosis not present

## 2023-09-23 DIAGNOSIS — Z23 Encounter for immunization: Secondary | ICD-10-CM | POA: Diagnosis not present

## 2023-10-08 ENCOUNTER — Emergency Department (HOSPITAL_BASED_OUTPATIENT_CLINIC_OR_DEPARTMENT_OTHER)
Admission: EM | Admit: 2023-10-08 | Discharge: 2023-10-08 | Disposition: A | Discharge status: Home / Self Care | Payer: BC Managed Care – PPO | Attending: Emergency Medicine | Admitting: Emergency Medicine

## 2023-10-08 ENCOUNTER — Emergency Department (HOSPITAL_BASED_OUTPATIENT_CLINIC_OR_DEPARTMENT_OTHER): Payer: BC Managed Care – PPO

## 2023-10-08 ENCOUNTER — Other Ambulatory Visit (HOSPITAL_BASED_OUTPATIENT_CLINIC_OR_DEPARTMENT_OTHER): Payer: Self-pay

## 2023-10-08 ENCOUNTER — Other Ambulatory Visit: Payer: Self-pay

## 2023-10-08 ENCOUNTER — Encounter (HOSPITAL_BASED_OUTPATIENT_CLINIC_OR_DEPARTMENT_OTHER): Payer: Self-pay | Admitting: Emergency Medicine

## 2023-10-08 DIAGNOSIS — R112 Nausea with vomiting, unspecified: Secondary | ICD-10-CM | POA: Diagnosis not present

## 2023-10-08 DIAGNOSIS — Z794 Long term (current) use of insulin: Secondary | ICD-10-CM | POA: Diagnosis not present

## 2023-10-08 DIAGNOSIS — D72829 Elevated white blood cell count, unspecified: Secondary | ICD-10-CM | POA: Diagnosis not present

## 2023-10-08 DIAGNOSIS — E119 Type 2 diabetes mellitus without complications: Secondary | ICD-10-CM | POA: Insufficient documentation

## 2023-10-08 DIAGNOSIS — I10 Essential (primary) hypertension: Secondary | ICD-10-CM | POA: Diagnosis not present

## 2023-10-08 DIAGNOSIS — Z7984 Long term (current) use of oral hypoglycemic drugs: Secondary | ICD-10-CM | POA: Insufficient documentation

## 2023-10-08 DIAGNOSIS — Z79899 Other long term (current) drug therapy: Secondary | ICD-10-CM | POA: Diagnosis not present

## 2023-10-08 DIAGNOSIS — R1011 Right upper quadrant pain: Secondary | ICD-10-CM | POA: Diagnosis not present

## 2023-10-08 DIAGNOSIS — Z7982 Long term (current) use of aspirin: Secondary | ICD-10-CM | POA: Insufficient documentation

## 2023-10-08 DIAGNOSIS — K760 Fatty (change of) liver, not elsewhere classified: Secondary | ICD-10-CM | POA: Diagnosis not present

## 2023-10-08 LAB — COMPREHENSIVE METABOLIC PANEL
ALT: 19 U/L (ref 0–44)
AST: 17 U/L (ref 15–41)
Albumin: 4 g/dL (ref 3.5–5.0)
Alkaline Phosphatase: 113 U/L (ref 38–126)
Anion gap: 11 (ref 5–15)
BUN: 14 mg/dL (ref 6–20)
CO2: 25 mmol/L (ref 22–32)
Calcium: 9 mg/dL (ref 8.9–10.3)
Chloride: 102 mmol/L (ref 98–111)
Creatinine, Ser: 0.85 mg/dL (ref 0.61–1.24)
GFR, Estimated: >60 mL/min (ref >60)
Glucose, Bld: 93 mg/dL (ref 70–99)
Potassium: 4.3 mmol/L (ref 3.5–5.1)
Sodium: 138 mmol/L (ref 135–145)
Total Bilirubin: 1.2 mg/dL (ref 0.0–1.2)
Total Protein: 8.3 g/dL — ABNORMAL HIGH (ref 6.5–8.1)

## 2023-10-08 LAB — URINALYSIS, ROUTINE W REFLEX MICROSCOPIC
Bilirubin Urine: NEGATIVE
Glucose, UA: >=500 mg/dL — AB
Ketones, ur: 80 mg/dL — AB
Leukocytes,Ua: NEGATIVE
Nitrite: NEGATIVE
Protein, ur: 100 mg/dL — AB
Specific Gravity, Urine: 1.02 (ref 1.005–1.030)
pH: 7 (ref 5.0–8.0)

## 2023-10-08 LAB — CBC WITH DIFFERENTIAL/PLATELET
Abs Immature Granulocytes: 0.03 10*3/uL (ref 0.00–0.07)
Basophils Absolute: 0.1 10*3/uL (ref 0.0–0.1)
Basophils Relative: 1 %
Eosinophils Absolute: 0.2 10*3/uL (ref 0.0–0.5)
Eosinophils Relative: 2 %
HCT: 46.6 % (ref 39.0–52.0)
Hemoglobin: 14.6 g/dL (ref 13.0–17.0)
Immature Granulocytes: 0 %
Lymphocytes Relative: 29 %
Lymphs Abs: 3.5 10*3/uL (ref 0.7–4.0)
MCH: 26.4 pg (ref 26.0–34.0)
MCHC: 31.3 g/dL (ref 30.0–36.0)
MCV: 84.3 fL (ref 80.0–100.0)
Monocytes Absolute: 0.8 10*3/uL (ref 0.1–1.0)
Monocytes Relative: 6 %
Neutro Abs: 7.5 10*3/uL (ref 1.7–7.7)
Neutrophils Relative %: 62 %
Platelets: 437 10*3/uL — ABNORMAL HIGH (ref 150–400)
RBC: 5.53 MIL/uL (ref 4.22–5.81)
RDW: 13.2 % (ref 11.5–15.5)
WBC: 12.2 10*3/uL — ABNORMAL HIGH (ref 4.0–10.5)
nRBC: 0 % (ref 0.0–0.2)

## 2023-10-08 LAB — URINALYSIS, MICROSCOPIC (REFLEX): WBC, UA: NONE SEEN WBC/hpf (ref 0–5)

## 2023-10-08 LAB — LIPASE, BLOOD: Lipase: 39 U/L (ref 11–51)

## 2023-10-08 MED ORDER — ONDANSETRON 4 MG PO TBDP
4.0000 mg | ORAL_TABLET | Freq: Three times a day (TID) | ORAL | 0 refills | Status: DC | PRN
  Filled 2023-10-08: qty 20, 7d supply, fill #0

## 2023-10-08 MED ORDER — ONDANSETRON 4 MG PO TBDP
4.0000 mg | ORAL_TABLET | Freq: Once | ORAL | Status: AC
  Administered 2023-10-08: 4 mg via ORAL
  Filled 2023-10-08: qty 1

## 2023-10-08 MED ORDER — ONDANSETRON 4 MG PO TBDP: 4.0000 mg | ORAL_TABLET | Freq: Three times a day (TID) | ORAL | 0 refills | Status: AC | PRN

## 2023-10-08 NOTE — ED Provider Notes (Signed)
 [place] [facility] Provider Note   CSN: [id number] Arrival date & time: 10/08/23  1303     History  Chief Complaint  Patient presents with   Abdominal Pain    Michael Foster is a 48 y.o. male obesity, hyperlipidemia, diabetes, hypertension, obstructive sleep apnea on CPAP.  Patient reports that he has had episodic right upper quadrant pain since 3 days ago.  Reports pain starts immediately after eating lasting several hours.  Pain is associated with nausea vomiting and bloating.  Patient reports he used to have episodes like this once a year however it seems to be increasing in severity.  Patient reports he has been able to tolerate water and soup but nothing else for the past 2 days.  Denies fevers or chills.  Denies dysuria, chest pain shortness of breath or cough.  Reports just starting Jardiance  for diabetes otherwise no new medications. No recent travel. No sick contact. No prior abdominal surgery.    Abdominal Pain      Home Medications Prior to Admission medications   Medication Sig Start Date End Date Taking? Authorizing Provider  albuterol  (VENTOLIN  HFA) 108 (90 Base) MCG/ACT inhaler INHALE 2 PUFFS BY MOUTH EVERY 6 HOURS AS NEEDED FOR WHEEZE OR SHORTNESS OF BREATH 07/09/23   Genora Kidd, MD  amoxicillin  (AMOXIL ) 500 MG capsule Take 1 capsule (500 mg total) by mouth 3 (three) times daily. 10/27/22   Steinl, Kevin, MD  amoxicillin -clavulanate (AUGMENTIN ) 875-125 MG tablet Take 1 tablet by mouth every 12 (twelve) hours. 12/10/22   Zelaya, Oscar A, PA-C  aspirin  EC 81 MG tablet Take 81 mg by mouth daily.    [provider]  B-D ULTRAFINE III SHORT PEN 31G X 8 MM MISC USE AS DIRECTED ONCE DAILY 08/07/23   Jagadish, Mayuri, MD  Blood Glucose Monitoring Suppl (ONETOUCH VERIO FLEX SYSTEM) w/Device KIT 1 Units by Does not apply route daily. Patient not taking: Reported on 08/28/2022 08/29/20   Jovita Nipper, MD  Continuous Blood Gluc  Sensor (DEXCOM G7 SENSOR) MISC Replace sensor every 10 days. 02/08/22   Koval, Peter G, RPH-CPP  glucose blood (ONETOUCH VERIO) test strip Use as instructed Patient not taking: Reported on 08/28/2022 08/29/20   Jovita Nipper, MD  insulin  aspart (FIASP ) 100 UNIT/ML FlexTouch Pen Inject 10-20 Units into the skin 2 (two) times daily before a meal. 10/02/22   Hensel, Azucena Bollard, MD  insulin  glargine, 2 Unit Dial , (TOUJEO  MAX SOLOSTAR) 300 UNIT/ML Solostar Pen Inject 50 Units into the skin daily. 10/02/22   Anibal Kent, MD  Lancet Devices (ONE TOUCH DELICA LANCING DEV) MISC 1 Device by Does not apply route daily. 08/29/20   Jovita Nipper, MD  lisinopril  (ZESTRIL ) 2.5 MG tablet TAKE 1 TABLET BY MOUTH EVERY DAY 04/14/23   Genora Kidd, MD  Melatonin 10 MG TABS Take 10 mg by mouth at bedtime.    [provider]  metFORMIN  (GLUCOPHAGE ) 500 MG tablet TAKE 1 TABLET BY MOUTH 2 TIMES DAILY WITH A MEAL. 03/05/23   Genora Kidd, MD  Omega-3 Fatty Acids (FISH OIL) 1000 MG CAPS Take 1 capsule by mouth daily.    [provider]  ondansetron  (ZOFRAN -ODT) 4 MG disintegrating tablet Take 1 tablet (4 mg total) by mouth every 8 (eight) hours as needed for nausea or vomiting. 07/27/23   Debbra Fairy, PA-C  rosuvastatin  (CRESTOR ) 5 MG tablet TAKE 1 TABLET BY MOUTH EVERY DAY 11/22/21   Genora Kidd, MD  sertraline  (ZOLOFT ) 50  MG tablet TAKE 1 TABLET BY MOUTH EVERY DAY 09/04/23   Genora Kidd, MD  traMADol  (ULTRAM ) 50 MG tablet Take 1 tablet (50 mg total) by mouth every 6 (six) hours as needed. 10/27/22   Steinl, Kevin, MD      Allergies    Semaglutide  and Slo-bid gyrocaps [theophylline]    Review of Systems   Review of Systems  Gastrointestinal:  Positive for abdominal pain.    Physical Exam Updated Vital Signs BP (!) 159/72 (BP Location: Right Arm)   Pulse 78   Temp 98 F (36.7 C)   Resp 18   Ht 5\' 9"  (1.753 m)   Wt (!) 136.5 kg   SpO2 96%   BMI 44.45 kg/m  Physical Exam Vitals and  nursing note reviewed.  Constitutional:      General: He is not in acute distress.    Appearance: He is not toxic-appearing.  HENT:     Head: Normocephalic and atraumatic.  Eyes:     General: No scleral icterus.    Conjunctiva/sclera: Conjunctivae normal.  Cardiovascular:     Rate and Rhythm: Normal rate and regular rhythm.     Pulses: Normal pulses.     Heart sounds: Normal heart sounds.  Pulmonary:     Effort: Pulmonary effort is normal. No respiratory distress.     Breath sounds: Normal breath sounds.  Abdominal:     General: Abdomen is flat. Bowel sounds are normal. There is no distension.     Palpations: Abdomen is soft. There is no mass.     Tenderness: There is no abdominal tenderness.     Comments: Mild RUQ tenderness to palpation.   Musculoskeletal:     Right lower leg: No edema.     Left lower leg: No edema.  Skin:    General: Skin is warm and dry.     Capillary Refill: Capillary refill takes less than 2 seconds.     Findings: No lesion.  Neurological:     General: No focal deficit present.     Mental Status: He is alert and oriented to person, place, and time. Mental status is at baseline.     ED Results / Procedures / Treatments   Labs (all labs ordered are listed, but only abnormal results are displayed) Labs Reviewed  COMPREHENSIVE METABOLIC PANEL - Abnormal; Notable for the following components:      Result Value   Total Protein 8.3 (*)    All other components within normal limits  URINALYSIS, ROUTINE W REFLEX MICROSCOPIC - Abnormal; Notable for the following components:   Glucose, UA >=500 (*)    Hgb urine dipstick MODERATE (*)    Ketones, ur 80 (*)    Protein, ur 100 (*)    All other components within normal limits  CBC WITH DIFFERENTIAL/PLATELET - Abnormal; Notable for the following components:   WBC 12.2 (*)    Platelets 437 (*)    All other components within normal limits  URINALYSIS, MICROSCOPIC (REFLEX) - Abnormal; Notable for the following  components:   Bacteria, UA RARE (*)    All other components within normal limits  LIPASE, BLOOD    EKG None  Radiology US  Abdomen Limited RUQ (LIVER/GB) Result Date: 10/08/2023 CLINICAL DATA:  Right upper quadrant pain since Monday postprandial EXAM: ULTRASOUND ABDOMEN LIMITED RIGHT UPPER QUADRANT COMPARISON:  Ultrasound 07/27/2023. FINDINGS: Gallbladder: No gallstones or wall thickening visualized. No sonographic Murphy sign noted by sonographer. Common bile duct: Diameter: 3 mm Liver: Echogenic hepatic parenchyma  diffusely. There is some presumed fatty sparing along the margin of the gallbladder. Portal vein is patent on color Doppler imaging with normal direction of blood flow towards the liver. Other: None. IMPRESSION: No gallstones or ductal dilatation. Fatty liver infiltration with presumed sparing along the gallbladder fossa margin. Electronically Signed   By: Adrianna Horde M.D.   On: 10/08/2023 17:24    Procedures Procedures    Medications Ordered in ED Medications - No data to display  ED Course/ Medical Decision Making/ A&P Clinical Course as of 10/08/23 1739  Wed Oct 08, 2023  1738 Patient requesting discharge immediately prior to receiving ultrasound results.  Reports he is feeling better and would just like to go home.  After discussion convince patient to stay for ultrasound results and p.o. challenge. [JB]    Clinical Course User Index [JB] Emileigh Kellett, Kandace Organ, PA-C                                 Medical Decision Making Amount and/or Complexity of Data Reviewed Labs: ordered. Radiology: ordered.  Risk Prescription drug management.   Calub Delman Ferns 48 y.o. presented today for abd pain. Working DDx includes, but not limited to, gastroenteritis, colitis, SBO, appendicitis, cholecystitis, hepatobiliary pathology, gastritis, PUD, ACS, dissection, pancreatitis, nephrolithiasis, AAA, UTI, pyelonephritis.  R/o DDx: These are considered less likely than current impression  due to history of present illness, physical exam, labs/imaging findings.  Review of prior external notes: 07/27/23 ED visit   Pmhx: obesity, hyperlipidemia, diabetes, hypertension, obstructive sleep apnea on CPAP  Unique Tests and My Interpretation:  CBC with mild leukocytosis, no anemia.  CMP without significant electrolyte abnormality and no AKI.  Urinalysis with no nitrates, no leukocytes does have large amount of glucose present to started Jardiance .  Imaging:  RUQ US : Fatty liver infiltrates.  No acute cholecystitis.  Problem List / ED Course / Critical interventions / Medication management  Patient presenting to emergency room with right upper quadrant pain that is associated with nausea and vomiting after eating.  Pain resolves shortly after eating.  He also has had intermittent bloating.  Denies fevers or chills.  Right upper lodgment ultrasound is negative for acute cholecystitis however symptoms appear to be of biliary colic.  Will refer to general surgery.  Discussed ordering a CT scan to rule out other acute intra-abdominal pathology however patient declined and is requesting immediate discharge.  Discussed return precautions.  Patient expressed frustration and how long work took.  Patient reports his symptoms have resolved and he is hungry, tired and just wants to go home.  Patient is stable for discharge.  No fever no tachycardia reassuring workup here.  Discussed all findings with patient who expressed understanding and return precautions. I ordered medication including Zofran  for nausea  Reevaluation of the patient after these medicines showed that the patient improved Passed PO challenge.  Patients vitals assessed. Upon arrival patient is hemodynamically stable.  I have reviewed the patients home medicines and have made adjustments as needed  Consult: None   Plan: Offered further workup, however patient requesting immediate discharge. Patient agreed to PO challenge prior to  leaving and passed PO challenge.  Follow up with general surgery. Discussed considering HIDA scan if symptoms continue.  Given return precautions.          Final Clinical Impression(s) / ED Diagnoses Final diagnoses:  RUQ pain    Rx / DC Orders ED Discharge Orders  Ordered    ondansetron  (ZOFRAN -ODT) 4 MG disintegrating tablet  Every 8 hours PRN,   Status:  Discontinued        10/08/23 1742    ondansetron  (ZOFRAN -ODT) 4 MG disintegrating tablet  Every 8 hours PRN        10/08/23 1811              Reygan Heagle, Kandace Organ, PA-C 10/08/23 1831    Sueellen Emery, MD 10/08/23 2222

## 2023-10-08 NOTE — ED Triage Notes (Signed)
 Pt with RUQ pain since Monday and vomiting after eating

## 2023-10-08 NOTE — Discharge Instructions (Addendum)
 You are seen in emergency room today.  Your labs and imaging are reassuring.  Please consider early follow-up with general surgery to discuss further workup.  Please use low-fat diet.  Take Zofran  as needed for nausea, you can take this prior to eating a meal as well.  Make sure you are staying well-hydrated with water you can alternate low sugar Pedialyte and Gatorade as well.  Return to emergency room with new or worsening symptoms.    If you do not follow-up with general surgery please follow-up with your primary care doctor for further assessment.

## 2023-10-09 ENCOUNTER — Other Ambulatory Visit (HOSPITAL_BASED_OUTPATIENT_CLINIC_OR_DEPARTMENT_OTHER): Payer: Self-pay

## 2023-10-21 DIAGNOSIS — G4733 Obstructive sleep apnea (adult) (pediatric): Secondary | ICD-10-CM | POA: Diagnosis not present

## 2023-10-26 ENCOUNTER — Other Ambulatory Visit: Payer: Self-pay | Admitting: Student

## 2023-10-26 DIAGNOSIS — J4520 Mild intermittent asthma, uncomplicated: Secondary | ICD-10-CM

## 2023-11-04 ENCOUNTER — Inpatient Hospital Stay (HOSPITAL_COMMUNITY)
Admission: EM | Admit: 2023-11-04 | Discharge: 2023-11-07 | DRG: 193 | Disposition: A | Discharge status: Home / Self Care | Payer: BC Managed Care – PPO | Attending: Internal Medicine | Admitting: Internal Medicine

## 2023-11-04 ENCOUNTER — Other Ambulatory Visit: Payer: Self-pay

## 2023-11-04 ENCOUNTER — Encounter (HOSPITAL_COMMUNITY): Payer: Self-pay

## 2023-11-04 ENCOUNTER — Emergency Department (HOSPITAL_COMMUNITY): Payer: BC Managed Care – PPO

## 2023-11-04 DIAGNOSIS — Z79899 Other long term (current) drug therapy: Secondary | ICD-10-CM

## 2023-11-04 DIAGNOSIS — Z8249 Family history of ischemic heart disease and other diseases of the circulatory system: Secondary | ICD-10-CM | POA: Diagnosis not present

## 2023-11-04 DIAGNOSIS — Z7984 Long term (current) use of oral hypoglycemic drugs: Secondary | ICD-10-CM

## 2023-11-04 DIAGNOSIS — Z1152 Encounter for screening for COVID-19: Secondary | ICD-10-CM | POA: Diagnosis not present

## 2023-11-04 DIAGNOSIS — Z6841 Body Mass Index (BMI) 40.0 and over, adult: Secondary | ICD-10-CM | POA: Diagnosis not present

## 2023-11-04 DIAGNOSIS — Z8673 Personal history of transient ischemic attack (TIA), and cerebral infarction without residual deficits: Secondary | ICD-10-CM

## 2023-11-04 DIAGNOSIS — Z833 Family history of diabetes mellitus: Secondary | ICD-10-CM

## 2023-11-04 DIAGNOSIS — E7800 Pure hypercholesterolemia, unspecified: Secondary | ICD-10-CM | POA: Diagnosis present

## 2023-11-04 DIAGNOSIS — G4733 Obstructive sleep apnea (adult) (pediatric): Secondary | ICD-10-CM

## 2023-11-04 DIAGNOSIS — R040 Epistaxis: Secondary | ICD-10-CM | POA: Diagnosis present

## 2023-11-04 DIAGNOSIS — I10 Essential (primary) hypertension: Secondary | ICD-10-CM | POA: Diagnosis not present

## 2023-11-04 DIAGNOSIS — R0602 Shortness of breath: Secondary | ICD-10-CM | POA: Diagnosis not present

## 2023-11-04 DIAGNOSIS — Z825 Family history of asthma and other chronic lower respiratory diseases: Secondary | ICD-10-CM

## 2023-11-04 DIAGNOSIS — J9601 Acute respiratory failure with hypoxia: Secondary | ICD-10-CM | POA: Diagnosis not present

## 2023-11-04 DIAGNOSIS — Z87891 Personal history of nicotine dependence: Secondary | ICD-10-CM

## 2023-11-04 DIAGNOSIS — Z8711 Personal history of peptic ulcer disease: Secondary | ICD-10-CM | POA: Diagnosis not present

## 2023-11-04 DIAGNOSIS — Z7982 Long term (current) use of aspirin: Secondary | ICD-10-CM | POA: Diagnosis not present

## 2023-11-04 DIAGNOSIS — Z83438 Family history of other disorder of lipoprotein metabolism and other lipidemia: Secondary | ICD-10-CM | POA: Diagnosis not present

## 2023-11-04 DIAGNOSIS — I159 Secondary hypertension, unspecified: Secondary | ICD-10-CM | POA: Diagnosis not present

## 2023-11-04 DIAGNOSIS — J189 Pneumonia, unspecified organism: Secondary | ICD-10-CM | POA: Diagnosis not present

## 2023-11-04 DIAGNOSIS — Z888 Allergy status to other drugs, medicaments and biological substances status: Secondary | ICD-10-CM | POA: Diagnosis not present

## 2023-11-04 DIAGNOSIS — E66813 Obesity, class 3: Secondary | ICD-10-CM | POA: Diagnosis present

## 2023-11-04 DIAGNOSIS — E119 Type 2 diabetes mellitus without complications: Secondary | ICD-10-CM

## 2023-11-04 DIAGNOSIS — F32A Depression, unspecified: Secondary | ICD-10-CM | POA: Diagnosis present

## 2023-11-04 DIAGNOSIS — J45909 Unspecified asthma, uncomplicated: Secondary | ICD-10-CM | POA: Diagnosis present

## 2023-11-04 DIAGNOSIS — Z794 Long term (current) use of insulin: Secondary | ICD-10-CM | POA: Diagnosis not present

## 2023-11-04 DIAGNOSIS — R918 Other nonspecific abnormal finding of lung field: Secondary | ICD-10-CM | POA: Diagnosis not present

## 2023-11-04 DIAGNOSIS — F988 Other specified behavioral and emotional disorders with onset usually occurring in childhood and adolescence: Secondary | ICD-10-CM | POA: Diagnosis present

## 2023-11-04 LAB — COMPREHENSIVE METABOLIC PANEL
ALT: 20 U/L (ref 0–44)
AST: 18 U/L (ref 15–41)
Albumin: 3.6 g/dL (ref 3.5–5.0)
Alkaline Phosphatase: 121 U/L (ref 38–126)
Anion gap: 11 (ref 5–15)
BUN: 11 mg/dL (ref 6–20)
CO2: 25 mmol/L (ref 22–32)
Calcium: 8.7 mg/dL — ABNORMAL LOW (ref 8.9–10.3)
Chloride: 105 mmol/L (ref 98–111)
Creatinine, Ser: 0.78 mg/dL (ref 0.61–1.24)
GFR, Estimated: >60 mL/min (ref >60)
Glucose, Bld: 131 mg/dL — ABNORMAL HIGH (ref 70–99)
Potassium: 3.7 mmol/L (ref 3.5–5.1)
Sodium: 141 mmol/L (ref 135–145)
Total Bilirubin: 1.3 mg/dL — ABNORMAL HIGH (ref 0.0–1.2)
Total Protein: 7.8 g/dL (ref 6.5–8.1)

## 2023-11-04 LAB — CBC WITH DIFFERENTIAL/PLATELET
Abs Immature Granulocytes: 0.09 10*3/uL — ABNORMAL HIGH (ref 0.00–0.07)
Basophils Absolute: 0.1 10*3/uL (ref 0.0–0.1)
Basophils Relative: 1 %
Eosinophils Absolute: 0.3 10*3/uL (ref 0.0–0.5)
Eosinophils Relative: 2 %
HCT: 45.1 % (ref 39.0–52.0)
Hemoglobin: 13.9 g/dL (ref 13.0–17.0)
Immature Granulocytes: 1 %
Lymphocytes Relative: 16 %
Lymphs Abs: 2.6 10*3/uL (ref 0.7–4.0)
MCH: 26.3 pg (ref 26.0–34.0)
MCHC: 30.8 g/dL (ref 30.0–36.0)
MCV: 85.3 fL (ref 80.0–100.0)
Monocytes Absolute: 0.8 10*3/uL (ref 0.1–1.0)
Monocytes Relative: 5 %
Neutro Abs: 11.8 10*3/uL — ABNORMAL HIGH (ref 1.7–7.7)
Neutrophils Relative %: 75 %
Platelets: 395 10*3/uL (ref 150–400)
RBC: 5.29 MIL/uL (ref 4.22–5.81)
RDW: 13.3 % (ref 11.5–15.5)
WBC: 15.7 10*3/uL — ABNORMAL HIGH (ref 4.0–10.5)
nRBC: 0 % (ref 0.0–0.2)

## 2023-11-04 LAB — BRAIN NATRIURETIC PEPTIDE: B Natriuretic Peptide: 63.8 pg/mL (ref 0.0–100.0)

## 2023-11-04 LAB — URINALYSIS, W/ REFLEX TO CULTURE (INFECTION SUSPECTED)
Bacteria, UA: NONE SEEN
Bilirubin Urine: NEGATIVE
Glucose, UA: NEGATIVE mg/dL
Hgb urine dipstick: NEGATIVE
Ketones, ur: NEGATIVE mg/dL
Leukocytes,Ua: NEGATIVE
Nitrite: NEGATIVE
Protein, ur: 100 mg/dL — AB
Specific Gravity, Urine: 1.008 (ref 1.005–1.030)
pH: 7 (ref 5.0–8.0)

## 2023-11-04 LAB — I-STAT CG4 LACTIC ACID, ED: Lactic Acid, Venous: 1.2 mmol/L (ref 0.5–1.9)

## 2023-11-04 LAB — PROTIME-INR
INR: 1 (ref 0.8–1.2)
Prothrombin Time: 13.8 s (ref 11.4–15.2)

## 2023-11-04 LAB — RESP PANEL BY RT-PCR (RSV, FLU A&B, COVID)  RVPGX2
Influenza A by PCR: NEGATIVE
Influenza B by PCR: NEGATIVE
Resp Syncytial Virus by PCR: NEGATIVE
SARS Coronavirus 2 by RT PCR: NEGATIVE

## 2023-11-04 LAB — APTT: aPTT: 30 s (ref 24–36)

## 2023-11-04 LAB — TROPONIN I (HIGH SENSITIVITY): Troponin I (High Sensitivity): 18 ng/L — ABNORMAL HIGH (ref <18)

## 2023-11-04 MED ORDER — SODIUM CHLORIDE 0.9 % IV SOLN
2.0000 g | Freq: Once | INTRAVENOUS | Status: DC
  Filled 2023-11-04: qty 12.5

## 2023-11-04 MED ORDER — LACTATED RINGERS IV BOLUS (SEPSIS)
1000.0000 mL | Freq: Once | INTRAVENOUS | Status: AC
  Administered 2023-11-05: 1000 mL via INTRAVENOUS

## 2023-11-04 MED ORDER — ALBUTEROL SULFATE HFA 108 (90 BASE) MCG/ACT IN AERS
2.0000 | INHALATION_SPRAY | RESPIRATORY_TRACT | Status: DC | PRN
  Administered 2023-11-05 (×2): 2 via RESPIRATORY_TRACT
  Filled 2023-11-04: qty 6.7

## 2023-11-04 MED ORDER — VANCOMYCIN HCL IN DEXTROSE 1-5 GM/200ML-% IV SOLN
1000.0000 mg | Freq: Once | INTRAVENOUS | Status: AC
  Administered 2023-11-04: 1000 mg via INTRAVENOUS
  Filled 2023-11-04: qty 200

## 2023-11-04 MED ORDER — IPRATROPIUM-ALBUTEROL 0.5-2.5 (3) MG/3ML IN SOLN
3.0000 mL | Freq: Once | RESPIRATORY_TRACT | Status: AC
  Administered 2023-11-04: 3 mL via RESPIRATORY_TRACT
  Filled 2023-11-04: qty 3

## 2023-11-04 MED ORDER — METRONIDAZOLE 500 MG/100ML IV SOLN
500.0000 mg | Freq: Once | INTRAVENOUS | Status: DC
  Filled 2023-11-04: qty 100

## 2023-11-04 MED ORDER — LACTATED RINGERS IV SOLN: INTRAVENOUS | Status: DC

## 2023-11-04 NOTE — ED Triage Notes (Signed)
Pt complaining of shortness of breath that started this week. Albuterol inhaler is not working. York Spaniel he has had a 20 pound weight gain in the last 2 weeks.

## 2023-11-04 NOTE — ED Notes (Signed)
Pt placed on 2lpm O2 via [place] due to O2 sats in the mid to high 80s

## 2023-11-04 NOTE — ED Provider Notes (Signed)
[facility]-EMERGENCY DEPT [facility] Emergency Department Provider Note MRN:  [id number]  Arrival date & time: 11/05/23     Chief Complaint   Shortness of Breath   History of Present Illness   Michael Foster is a 48 y.o. year-old male presents to the ED with chief complaint of SOB and cough.  Onset of SOB was a few days ago.  Acutely worsened today.  In triage was noted to be in the upper 80s% on RA.  He was put on 2L [place].  He states that he has had both productive cough and fever.  He has been using an inhaler without relief.  He denies any heart problems.  Reports hx of asthma and states that this felt  similar.  History provided by patient.   Review of Systems  Pertinent positive and negative review of systems noted in HPI.    Physical Exam   Vitals:   11/04/23 2224 11/04/23 2355  BP: (!) 240/96 (!) 216/84  Pulse: (!) 105 95  Resp: (!) 22 17  Temp: 99.8 F (37.7 C)   SpO2: 91% 93%    CONSTITUTIONAL:  non toxic-appearing, NAD NEURO:  Alert and oriented x 3, CN 3-12 grossly intact EYES:  eyes equal and reactive ENT/NECK:  Supple, no stridor  CARDIO:  tachycardic, regular rhythm, appears well-perfused  PULM:  Mildly increased WOB, but without respiratory distress, lung sounds are diminished GI/GU:  non-distended,  MSK/SPINE:  No gross deformities, no edema, moves all extremities  SKIN:  no rash, atraumatic   *Additional and/or pertinent findings included in MDM below  Diagnostic and Interventional Summary    EKG Interpretation Date/Time:    Ventricular Rate:    PR Interval:    QRS Duration:    QT Interval:    QTC Calculation:   R Axis:      Text Interpretation:         Labs Reviewed  COMPREHENSIVE METABOLIC PANEL - Abnormal; Notable for the following components:      Result Value   Glucose, Bld 131 (*)    Calcium 8.7 (*)    Total Bilirubin 1.3 (*)    All other components within normal limits  CBC WITH DIFFERENTIAL/PLATELET - Abnormal; Notable for the  following components:   WBC 15.7 (*)    Neutro Abs 11.8 (*)    Abs Immature Granulocytes 0.09 (*)    All other components within normal limits  URINALYSIS, W/ REFLEX TO CULTURE (INFECTION SUSPECTED) - Abnormal; Notable for the following components:   Color, Urine STRAW (*)    Protein, ur 100 (*)    All other components within normal limits  TROPONIN I (HIGH SENSITIVITY) - Abnormal; Notable for the following components:   Troponin I (High Sensitivity) 18 (*)    All other components within normal limits  RESP PANEL BY RT-PCR (RSV, FLU A&B, COVID)  RVPGX2  CULTURE, BLOOD (ROUTINE X 2)  CULTURE, BLOOD (ROUTINE X 2)  BRAIN NATRIURETIC PEPTIDE  PROTIME-INR  APTT  HEMOGLOBIN A1C  I-STAT CG4 LACTIC ACID, ED  I-STAT CG4 LACTIC ACID, ED  TROPONIN I (HIGH SENSITIVITY)    DG Chest 2 View  Final Result      Medications  albuterol (VENTOLIN HFA) 108 (90 Base) MCG/ACT inhaler 2 puff (has no administration in time range)  lactated ringers infusion ( Intravenous New Bag/Given 11/04/23 2343)  lactated ringers bolus 1,000 mL (has no administration in time range)  ceFEPIme (MAXIPIME) 2 g in sodium chloride 0.9 % 100 mL IVPB (  has no administration in time range)  metroNIDAZOLE (FLAGYL) IVPB 500 mg (has no administration in time range)  vancomycin (VANCOCIN) IVPB 1000 mg/200 mL premix (1,000 mg Intravenous New Bag/Given 11/04/23 2343)  insulin aspart (novoLOG) injection 0-15 Units (has no administration in time range)  insulin aspart (novoLOG) injection 0-5 Units (has no administration in time range)  ipratropium-albuterol (DUONEB) 0.5-2.5 (3) MG/3ML nebulizer solution 3 mL (3 mLs Nebulization Given 11/04/23 2355)     Procedures  /  Critical Care .Critical Care  Performed by: Roxy Horseman, PA-C Authorized by: Roxy Horseman, PA-C   Critical care provider statement:    Critical care time (minutes):  53   Critical care was necessary to treat or prevent imminent or life-threatening  deterioration of the following conditions:  Respiratory failure   Critical care was time spent personally by me on the following activities:  Development of treatment plan with patient or surrogate, discussions with consultants, evaluation of patient's response to treatment, examination of patient, ordering and review of laboratory studies, ordering and review of radiographic studies, ordering and performing treatments and interventions, pulse oximetry, re-evaluation of patient's condition and review of old charts   ED Course and Medical Decision Making  I have reviewed the triage vital signs, the nursing notes, and pertinent available records from the EMR.  Social Determinants Affecting Complexity of Care: Patient has no clinically significant social determinants affecting this chief complaint..   ED Course:    Medical Decision Making Patient here with shortness of breath over the past couple of days.  He has had associated fevers and cough.  Labs and imaging discussed below.  Will admit patient for multifocal pneumonia and acute hypoxic respiratory failure.  Patient is requiring 2 L nasal cannula.  Problems Addressed: Acute hypoxic respiratory failure (HCC): acute illness or injury that poses a threat to life or bodily functions Multifocal pneumonia: acute illness or injury that poses a threat to life or bodily functions  Amount and/or Complexity of Data Reviewed Labs: ordered.    Details: Lactic is 1.2, patient is not hypotensive, will hold off on weight-based fluids. Leukocytosis to 15.7, patient getting broad-spectrum antibiotics. COVID and flu are negative. He has a mildly elevated troponin 18, will continue to trend.  Will need admission to hospital. Radiology: ordered and independent interpretation performed.    Details: Multifocal pneumonia  Risk Prescription drug management. Decision regarding hospitalization.         Consultants: I consulted with Hospitalist, Dr.  Joneen Roach, who is appreciated for admitting.   Treatment and Plan: Patient's exam and diagnostic results are concerning for acute hypoxic respiratory failure 2/2 multifocal pneumonia.  Feel that patient will need admission to the hospital for further treatment and evaluation.    Final Clinical Impressions(s) / ED Diagnoses     ICD-10-CM   1. Multifocal pneumonia  J18.9     2. Acute hypoxic respiratory failure (HCC)  J96.01       ED Discharge Orders     None         Discharge Instructions Discussed with and Provided to Patient:   Discharge Instructions   None      Roxy Horseman, PA-C 11/05/23 0041    Ernie Avena, MD 11/05/23 [phone]

## 2023-11-05 DIAGNOSIS — E66813 Obesity, class 3: Secondary | ICD-10-CM | POA: Diagnosis present

## 2023-11-05 DIAGNOSIS — Z83438 Family history of other disorder of lipoprotein metabolism and other lipidemia: Secondary | ICD-10-CM | POA: Diagnosis not present

## 2023-11-05 DIAGNOSIS — J189 Pneumonia, unspecified organism: Principal | ICD-10-CM

## 2023-11-05 DIAGNOSIS — Z888 Allergy status to other drugs, medicaments and biological substances status: Secondary | ICD-10-CM | POA: Diagnosis not present

## 2023-11-05 DIAGNOSIS — Z7982 Long term (current) use of aspirin: Secondary | ICD-10-CM | POA: Diagnosis not present

## 2023-11-05 DIAGNOSIS — E119 Type 2 diabetes mellitus without complications: Secondary | ICD-10-CM | POA: Diagnosis present

## 2023-11-05 DIAGNOSIS — Z87891 Personal history of nicotine dependence: Secondary | ICD-10-CM | POA: Diagnosis not present

## 2023-11-05 DIAGNOSIS — J9601 Acute respiratory failure with hypoxia: Secondary | ICD-10-CM

## 2023-11-05 DIAGNOSIS — E7800 Pure hypercholesterolemia, unspecified: Secondary | ICD-10-CM

## 2023-11-05 DIAGNOSIS — J45909 Unspecified asthma, uncomplicated: Secondary | ICD-10-CM | POA: Diagnosis present

## 2023-11-05 DIAGNOSIS — Z825 Family history of asthma and other chronic lower respiratory diseases: Secondary | ICD-10-CM | POA: Diagnosis not present

## 2023-11-05 DIAGNOSIS — Z7984 Long term (current) use of oral hypoglycemic drugs: Secondary | ICD-10-CM | POA: Diagnosis not present

## 2023-11-05 DIAGNOSIS — Z8711 Personal history of peptic ulcer disease: Secondary | ICD-10-CM | POA: Diagnosis not present

## 2023-11-05 DIAGNOSIS — Z1152 Encounter for screening for COVID-19: Secondary | ICD-10-CM | POA: Diagnosis not present

## 2023-11-05 DIAGNOSIS — Z6841 Body Mass Index (BMI) 40.0 and over, adult: Secondary | ICD-10-CM | POA: Diagnosis not present

## 2023-11-05 DIAGNOSIS — Z833 Family history of diabetes mellitus: Secondary | ICD-10-CM | POA: Diagnosis not present

## 2023-11-05 DIAGNOSIS — R040 Epistaxis: Secondary | ICD-10-CM | POA: Diagnosis present

## 2023-11-05 DIAGNOSIS — F32A Depression, unspecified: Secondary | ICD-10-CM | POA: Diagnosis present

## 2023-11-05 DIAGNOSIS — Z8249 Family history of ischemic heart disease and other diseases of the circulatory system: Secondary | ICD-10-CM | POA: Diagnosis not present

## 2023-11-05 DIAGNOSIS — G4733 Obstructive sleep apnea (adult) (pediatric): Secondary | ICD-10-CM | POA: Diagnosis present

## 2023-11-05 DIAGNOSIS — I10 Essential (primary) hypertension: Secondary | ICD-10-CM

## 2023-11-05 DIAGNOSIS — Z794 Long term (current) use of insulin: Secondary | ICD-10-CM | POA: Diagnosis not present

## 2023-11-05 DIAGNOSIS — Z8673 Personal history of transient ischemic attack (TIA), and cerebral infarction without residual deficits: Secondary | ICD-10-CM | POA: Diagnosis not present

## 2023-11-05 DIAGNOSIS — I159 Secondary hypertension, unspecified: Secondary | ICD-10-CM

## 2023-11-05 LAB — BASIC METABOLIC PANEL
Anion gap: 10 (ref 5–15)
BUN: 12 mg/dL (ref 6–20)
CO2: 22 mmol/L (ref 22–32)
Calcium: 8.1 mg/dL — ABNORMAL LOW (ref 8.9–10.3)
Chloride: 105 mmol/L (ref 98–111)
Creatinine, Ser: 0.71 mg/dL (ref 0.61–1.24)
GFR, Estimated: >60 mL/min (ref >60)
Glucose, Bld: 158 mg/dL — ABNORMAL HIGH (ref 70–99)
Potassium: 3.9 mmol/L (ref 3.5–5.1)
Sodium: 137 mmol/L (ref 135–145)

## 2023-11-05 LAB — RESPIRATORY PANEL BY PCR

## 2023-11-05 LAB — CBC WITH DIFFERENTIAL/PLATELET
Abs Immature Granulocytes: 0.11 10*3/uL — ABNORMAL HIGH (ref 0.00–0.07)
Basophils Absolute: 0.1 10*3/uL (ref 0.0–0.1)
Basophils Relative: 1 %
Eosinophils Absolute: 0.2 10*3/uL (ref 0.0–0.5)
Eosinophils Relative: 1 %
HCT: 41.6 % (ref 39.0–52.0)
Hemoglobin: 12.6 g/dL — ABNORMAL LOW (ref 13.0–17.0)
Immature Granulocytes: 1 %
Lymphocytes Relative: 13 %
Lymphs Abs: 2 10*3/uL (ref 0.7–4.0)
MCH: 26.2 pg (ref 26.0–34.0)
MCHC: 30.3 g/dL (ref 30.0–36.0)
MCV: 86.5 fL (ref 80.0–100.0)
Monocytes Absolute: 0.9 10*3/uL (ref 0.1–1.0)
Monocytes Relative: 6 %
Neutro Abs: 12 10*3/uL — ABNORMAL HIGH (ref 1.7–7.7)
Neutrophils Relative %: 78 %
Platelets: 351 10*3/uL (ref 150–400)
RBC: 4.81 MIL/uL (ref 4.22–5.81)
RDW: 13.2 % (ref 11.5–15.5)
WBC: 15.3 10*3/uL — ABNORMAL HIGH (ref 4.0–10.5)
nRBC: 0 % (ref 0.0–0.2)

## 2023-11-05 LAB — CBC
HCT: 41.6 % (ref 39.0–52.0)
Hemoglobin: 12.9 g/dL — ABNORMAL LOW (ref 13.0–17.0)
MCH: 26.5 pg (ref 26.0–34.0)
MCHC: 31 g/dL (ref 30.0–36.0)
MCV: 85.4 fL (ref 80.0–100.0)
Platelets: 377 10*3/uL (ref 150–400)
RBC: 4.87 MIL/uL (ref 4.22–5.81)
RDW: 13.2 % (ref 11.5–15.5)
WBC: 16.4 10*3/uL — ABNORMAL HIGH (ref 4.0–10.5)
nRBC: 0 % (ref 0.0–0.2)

## 2023-11-05 LAB — EXPECTORATED SPUTUM ASSESSMENT W GRAM STAIN, RFLX TO RESP C

## 2023-11-05 LAB — CREATININE, SERUM
Creatinine, Ser: 0.66 mg/dL (ref 0.61–1.24)
GFR, Estimated: >60 mL/min (ref >60)

## 2023-11-05 LAB — HEMOGLOBIN A1C
Hgb A1c MFr Bld: 9.7 % — ABNORMAL HIGH (ref 4.8–5.6)
Mean Plasma Glucose: 231.69 mg/dL

## 2023-11-05 LAB — SEDIMENTATION RATE: Sed Rate: 37 mm/h — ABNORMAL HIGH (ref 0–16)

## 2023-11-05 LAB — PROCALCITONIN: Procalcitonin: <0.1 ng/mL

## 2023-11-05 LAB — STREP PNEUMONIAE URINARY ANTIGEN: Strep Pneumo Urinary Antigen: NEGATIVE

## 2023-11-05 LAB — CBG MONITORING, ED
Glucose-Capillary: 167 mg/dL — ABNORMAL HIGH (ref 70–99)
Glucose-Capillary: 140 mg/dL — ABNORMAL HIGH (ref 70–99)
Glucose-Capillary: 161 mg/dL — ABNORMAL HIGH (ref 70–99)

## 2023-11-05 LAB — TROPONIN I (HIGH SENSITIVITY): Troponin I (High Sensitivity): 23 ng/L — ABNORMAL HIGH (ref <18)

## 2023-11-05 LAB — HIV ANTIBODY (ROUTINE TESTING W REFLEX): HIV Screen 4th Generation wRfx: NONREACTIVE

## 2023-11-05 LAB — D-DIMER, QUANTITATIVE: D-Dimer, Quant: 1.07 ug{FEU}/mL — ABNORMAL HIGH (ref 0.00–0.50)

## 2023-11-05 MED ORDER — AMPHETAMINE-DEXTROAMPHETAMINE 20 MG PO TABS: 20.0000 mg | ORAL_TABLET | Freq: Every day | ORAL | Status: DC

## 2023-11-05 MED ORDER — INSULIN GLARGINE-YFGN 100 UNIT/ML ~~LOC~~ SOLN
35.0000 [IU] | Freq: Every day | SUBCUTANEOUS | Status: DC
  Administered 2023-11-05 – 2023-11-07 (×3): 35 [IU] via SUBCUTANEOUS
  Filled 2023-11-05 (×3): qty 0.35

## 2023-11-05 MED ORDER — SENNOSIDES-DOCUSATE SODIUM 8.6-50 MG PO TABS: 1.0000 | ORAL_TABLET | Freq: Every evening | ORAL | Status: DC | PRN

## 2023-11-05 MED ORDER — ACETAMINOPHEN 325 MG PO TABS
650.0000 mg | ORAL_TABLET | Freq: Four times a day (QID) | ORAL | Status: DC | PRN
  Administered 2023-11-05 – 2023-11-06 (×4): 650 mg via ORAL
  Filled 2023-11-05 (×4): qty 2

## 2023-11-05 MED ORDER — LISINOPRIL 20 MG PO TABS
20.0000 mg | ORAL_TABLET | Freq: Every day | ORAL | Status: DC
  Administered 2023-11-06 – 2023-11-07 (×2): 20 mg via ORAL
  Filled 2023-11-05 (×2): qty 1

## 2023-11-05 MED ORDER — INSULIN GLARGINE (2 UNIT DIAL) 300 UNIT/ML ~~LOC~~ SOPN: 35.0000 [IU] | PEN_INJECTOR | Freq: Every day | SUBCUTANEOUS | Status: DC

## 2023-11-05 MED ORDER — LISINOPRIL 10 MG PO TABS
10.0000 mg | ORAL_TABLET | Freq: Every day | ORAL | Status: DC
  Administered 2023-11-05: 10 mg via ORAL
  Filled 2023-11-05: qty 1

## 2023-11-05 MED ORDER — SERTRALINE HCL 50 MG PO TABS
50.0000 mg | ORAL_TABLET | Freq: Every day | ORAL | Status: DC
  Administered 2023-11-05 – 2023-11-07 (×3): 50 mg via ORAL
  Filled 2023-11-05 (×3): qty 1

## 2023-11-05 MED ORDER — SODIUM CHLORIDE 0.9 % IV SOLN
2.0000 g | INTRAVENOUS | Status: DC
  Administered 2023-11-05 – 2023-11-07 (×3): 2 g via INTRAVENOUS
  Filled 2023-11-05 (×4): qty 20

## 2023-11-05 MED ORDER — LISINOPRIL 2.5 MG PO TABS: 2.5000 mg | ORAL_TABLET | Freq: Every day | ORAL | Status: DC

## 2023-11-05 MED ORDER — ONDANSETRON HCL 4 MG PO TABS: 4.0000 mg | ORAL_TABLET | Freq: Four times a day (QID) | ORAL | Status: DC | PRN

## 2023-11-05 MED ORDER — METOPROLOL TARTRATE 5 MG/5ML IV SOLN
10.0000 mg | INTRAVENOUS | Status: DC | PRN
  Administered 2023-11-05 (×2): 10 mg via INTRAVENOUS
  Filled 2023-11-05 (×2): qty 10

## 2023-11-05 MED ORDER — INSULIN ASPART 100 UNIT/ML IJ SOLN
0.0000 [IU] | Freq: Three times a day (TID) | INTRAMUSCULAR | Status: DC
  Administered 2023-11-05 (×2): 2 [IU] via SUBCUTANEOUS
  Administered 2023-11-05: 3 [IU] via SUBCUTANEOUS
  Administered 2023-11-06 (×2): 2 [IU] via SUBCUTANEOUS
  Filled 2023-11-05: qty 0.15

## 2023-11-05 MED ORDER — ROSUVASTATIN CALCIUM 10 MG PO TABS
20.0000 mg | ORAL_TABLET | Freq: Every day | ORAL | Status: DC
Start: 2023-11-05 — End: 2023-11-07
  Administered 2023-11-05 – 2023-11-07 (×3): 20 mg via ORAL
  Filled 2023-11-05 (×2): qty 2
  Filled 2023-11-05: qty 1

## 2023-11-05 MED ORDER — HYDRALAZINE HCL 20 MG/ML IJ SOLN
10.0000 mg | INTRAMUSCULAR | Status: DC | PRN
  Administered 2023-11-05: 10 mg via INTRAVENOUS
  Filled 2023-11-05: qty 1

## 2023-11-05 MED ORDER — ONDANSETRON HCL 4 MG/2ML IJ SOLN: 4.0000 mg | Freq: Four times a day (QID) | INTRAMUSCULAR | Status: DC | PRN

## 2023-11-05 MED ORDER — HEPARIN SODIUM (PORCINE) 5000 UNIT/ML IJ SOLN
5000.0000 [IU] | Freq: Three times a day (TID) | INTRAMUSCULAR | Status: DC
Start: 2023-11-05 — End: 2023-11-07
  Administered 2023-11-05 – 2023-11-07 (×7): 5000 [IU] via SUBCUTANEOUS
  Filled 2023-11-05 (×7): qty 1

## 2023-11-05 MED ORDER — INSULIN ASPART 100 UNIT/ML IJ SOLN
0.0000 [IU] | Freq: Every day | INTRAMUSCULAR | Status: DC
  Filled 2023-11-05: qty 0.05

## 2023-11-05 MED ORDER — ACETAMINOPHEN 650 MG RE SUPP: 650.0000 mg | Freq: Four times a day (QID) | RECTAL | Status: DC | PRN

## 2023-11-05 MED ORDER — MELATONIN 5 MG PO TABS
20.0000 mg | ORAL_TABLET | Freq: Every day | ORAL | Status: DC
  Administered 2023-11-05 – 2023-11-06 (×2): 20 mg via ORAL
  Filled 2023-11-05 (×2): qty 4

## 2023-11-05 MED ORDER — HYDRALAZINE HCL 25 MG PO TABS
25.0000 mg | ORAL_TABLET | Freq: Three times a day (TID) | ORAL | Status: DC
  Administered 2023-11-05 – 2023-11-07 (×6): 25 mg via ORAL
  Filled 2023-11-05 (×6): qty 1

## 2023-11-05 MED ORDER — SODIUM CHLORIDE 0.9 % IV SOLN
500.0000 mg | INTRAVENOUS | Status: DC
  Administered 2023-11-05 – 2023-11-06 (×2): 500 mg via INTRAVENOUS
  Filled 2023-11-05 (×2): qty 5

## 2023-11-05 NOTE — Progress Notes (Signed)
No charge note  Patient seen and examined this morning, admitted overnight, H&P reviewed and I agree with the assessment and plan.  48 year old male with history of obesity, OSA on CPAP, DM2, HTN, comes into the hospital with worsening shortness of breath, increased wheezing.  This has been going on for the last few days.  He has been trying to use his inhaler at home without significant help.  Evaluation on admission revealed hypoxia requiring 2 L supplemental oxygen, and a chest x-ray showed multifocal pneumonia.  He was placed on antibiotics and admitted to the hospital  Principal problem Multifocal pneumonia, acute hypoxic respiratory failure-continue supportive care with ceftriaxone and azithromycin, continue nebulizers PRN.  Wean off to room air as able  Active problems OSA-continue CPAP  Obesity, morbid-BMI 46, he would benefit from weight loss  DM2-continue sliding scale, glargine  Essential hypertension-continue home lisinopril  Hyperlipidemia-continue statin  History of TIA-resume home aspirin  Depression-continue home medications  Scheduled Meds:  heparin  5,000 Units Subcutaneous Q8H   insulin aspart  0-15 Units Subcutaneous TID WC   insulin aspart  0-5 Units Subcutaneous QHS   insulin glargine-yfgn  35 Units Subcutaneous Daily   lisinopril  10 mg Oral Daily   rosuvastatin  20 mg Oral Daily   sertraline  50 mg Oral Daily   Continuous Infusions:  azithromycin Stopped (11/05/23 0234)   cefTRIAXone (ROCEPHIN)  IV Stopped (11/05/23 0305)   PRN Meds:.acetaminophen **OR** acetaminophen, albuterol, metoprolol tartrate, ondansetron **OR** ondansetron (ZOFRAN) IV, senna-docusate  Martin Belling M. Elvera Lennox, MD, PhD [facility]  Between 7 am - 7 pm you can contact me via Amion (for emergencies) or Securechat (non urgent matters).  I am not available 7 pm - 7 am, please contact night coverage MD/APP via Amion

## 2023-11-05 NOTE — Plan of Care (Signed)

## 2023-11-05 NOTE — Sepsis Progress Note (Signed)
Elink monitoring for the code sepsis protocol.

## 2023-11-05 NOTE — H&P (Addendum)
PCP:   Orpha Bur, MD   Chief Complaint:  Inability to take a deep breath  HPI: This is a 48 year old male with history of extreme morbid obesity, GERD, OSA on CPAP, T2DM, asthma, HTN, and PUD.  Over the weekend the patient found he could not take a deep breath.  Today it became particularly bad when he was really out of breath.  His asthma pump just started using was recognized illness.  Endorses mild fever, not much coughing or wheezing.  He came to the ER.  Presenting vitals 240/96, 105, 91, 22, Tmax 99.8.  Oxygen mid 80s.  Patient is on 2 L oxygen.  Troponin 18, lactic acid 1.2.  WBCs 15.7.  Respiratory panel negative.  CXR multifocal pneumonia.  Review of Systems:  Per HPI  Past Medical History: Past Medical History:  Diagnosis Date   ADD (attention deficit disorder)    Asthma    as a child   Diabetes mellitus Dx 2009   Gastroenteritis    H/O TIA (transient ischemic attack) and stroke 01/04/2015   06/2014 with L hand numbness    Hypertension Dx 2009   Vertigo    Past Surgical History:  Procedure Laterality Date   MOUTH SURGERY      Medications: Prior to Admission medications   Medication Sig Start Date End Date Taking? Authorizing Provider  albuterol (VENTOLIN HFA) 108 (90 Base) MCG/ACT inhaler INHALE 2 PUFFS BY MOUTH EVERY 6 HOURS AS NEEDED FOR WHEEZE OR SHORTNESS OF BREATH 07/09/23   Levin Erp, MD  amoxicillin (AMOXIL) 500 MG capsule Take 1 capsule (500 mg total) by mouth 3 (three) times daily. 10/27/22   Cathren Laine, MD  amoxicillin-clavulanate (AUGMENTIN) 875-125 MG tablet Take 1 tablet by mouth every 12 (twelve) hours. 12/10/22   Smitty Knudsen, PA-C  aspirin EC 81 MG tablet Take 81 mg by mouth daily.    [provider]  B-D ULTRAFINE III SHORT PEN 31G X 8 MM MISC USE AS DIRECTED ONCE DAILY 08/07/23   Levin Erp, MD  Blood Glucose Monitoring Suppl (ONETOUCH VERIO FLEX SYSTEM) w/Device KIT 1 Units by Does not apply route daily. Patient not  taking: Reported on 08/28/2022 08/29/20   Mirian Mo, MD  Continuous Blood Gluc Sensor (DEXCOM G7 SENSOR) MISC Replace sensor every 10 days. 02/08/22   Kathrin Ruddy, RPH-CPP  glucose blood (ONETOUCH VERIO) test strip Use as instructed Patient not taking: Reported on 08/28/2022 08/29/20   Mirian Mo, MD  insulin aspart (FIASP) 100 UNIT/ML FlexTouch Pen Inject 10-20 Units into the skin 2 (two) times daily before a meal. 10/02/22   Hensel, Santiago Bumpers, MD  insulin glargine, 2 Unit Dial, (TOUJEO MAX SOLOSTAR) 300 UNIT/ML Solostar Pen Inject 50 Units into the skin daily. 10/02/22   Moses Manners, MD  Lancet Devices (ONE TOUCH DELICA LANCING DEV) MISC 1 Device by Does not apply route daily. 08/29/20   Mirian Mo, MD  lisinopril (ZESTRIL) 2.5 MG tablet TAKE 1 TABLET BY MOUTH EVERY DAY 04/14/23   Levin Erp, MD  Melatonin 10 MG TABS Take 10 mg by mouth at bedtime.    [provider]  metFORMIN (GLUCOPHAGE) 500 MG tablet TAKE 1 TABLET BY MOUTH 2 TIMES DAILY WITH A MEAL. 03/05/23   Levin Erp, MD  Omega-3 Fatty Acids (FISH OIL) 1000 MG CAPS Take 1 capsule by mouth daily.    [provider]  ondansetron (ZOFRAN-ODT) 4 MG disintegrating tablet Take 1 tablet (4 mg total) by mouth every  8 (eight) hours as needed for nausea or vomiting. 10/08/23   Barrett, Horald Chestnut, PA-C  rosuvastatin (CRESTOR) 5 MG tablet TAKE 1 TABLET BY MOUTH EVERY DAY 11/22/21   Levin Erp, MD  sertraline (ZOLOFT) 50 MG tablet TAKE 1 TABLET BY MOUTH EVERY DAY 09/04/23   Levin Erp, MD  traMADol (ULTRAM) 50 MG tablet Take 1 tablet (50 mg total) by mouth every 6 (six) hours as needed. 10/27/22   Cathren Laine, MD    Allergies:   Allergies  Allergen Reactions   Semaglutide Other (See Comments)    Pancreatitis - Emergency Dept Visit only   Slo-Bid Gyrocaps [Theophylline] Other (See Comments)    Unknown childhood reaction    Social History:  reports that he quit smoking about 10 years ago. His smoking  use included cigarettes. He started smoking about 20 years ago. He has a 3 pack-year smoking history. He has never used smokeless tobacco. He reports that he does not drink alcohol and does not use drugs.  Family History: Family History  Problem Relation Age of Onset   COPD Father    Hypertension Father    Heart failure Father    Diabetes Father    Diabetes Mother    Hyperlipidemia Mother    Sleep apnea Brother    Hypertension Brother     Physical Exam: Vitals:   11/04/23 2224 11/04/23 2225 11/04/23 2355  BP: (!) 240/96  (!) 216/84  Pulse: (!) 105  95  Resp: (!) 22  17  Temp: 99.8 F (37.7 C)    TempSrc: Oral    SpO2: 91%  93%  Weight:  (!) 142.9 kg   Height:  5\' 9"  (1.753 m)     General:  A&Ox3, morbidly obese, not in distress Eyes: Pink conjunctiva, no scleral icterus ENT: Moist oral mucosa, neck supple, no thyromegaly Lungs: CTA B/L, no wheeze, no crackles, no use of accessory muscles Cardiovascular: RRR, no regurgitation, no gallops, no murmurs. No carotid bruits, no JVD Abdomen: soft, positive BS, NTND, no organomegaly, not an acute abdomen GU: not examined Neuro: CN II - XII grossly intact, sensation intact Musculoskeletal: strength 5/5 all extremities, no edema Skin: no rash, no subcutaneous crepitation, no decubitus Psych: appropriate patient  Labs on Admission:  Recent Labs    11/04/23 2234  NA 141  K 3.7  CL 105  CO2 25  GLUCOSE 131*  BUN 11  CREATININE 0.78  CALCIUM 8.7*   Recent Labs    11/04/23 2234  AST 18  ALT 20  ALKPHOS 121  BILITOT 1.3*  PROT 7.8  ALBUMIN 3.6    Recent Labs    11/04/23 2234  WBC 15.7*  NEUTROABS 11.8*  HGB 13.9  HCT 45.1  MCV 85.3  PLT 395    Micro Results: Recent Results (from the past 240 hours)  Resp panel by RT-PCR (RSV, Flu A&B, Covid) Anterior Nasal Swab     Status: None   Collection Time: 11/04/23 11:06 PM   Specimen: Anterior Nasal Swab  Result Value Ref Range Status   SARS Coronavirus 2 by RT  PCR NEGATIVE NEGATIVE Final    Comment: (NOTE) SARS-CoV-2 target nucleic acids are NOT DETECTED.  The SARS-CoV-2 RNA is generally detectable in upper respiratory specimens during the acute phase of infection. The lowest concentration of SARS-CoV-2 viral copies this assay can detect is 138 copies/mL. A negative result does not preclude SARS-Cov-2 infection and should not be used as the sole basis for treatment or other patient  management decisions. A negative result may occur with  improper specimen collection/handling, submission of specimen other than nasopharyngeal swab, presence of viral mutation(s) within the areas targeted by this assay, and inadequate number of viral copies(<138 copies/mL). A negative result must be combined with clinical observations, patient history, and epidemiological information. The expected result is Negative.  Fact Sheet for Patients:  BloggerCourse.com  Fact Sheet for Healthcare Providers:  SeriousBroker.it  This test is no t yet approved or cleared by the [place] FDA and  has been authorized for detection and/or diagnosis of SARS-CoV-2 by FDA under an Emergency Use Authorization (EUA). This EUA will remain  in effect (meaning this test can be used) for the duration of the COVID-19 declaration under Section 564(b)(1) of the Act, 21 U.S.C.section 360bbb-3(b)(1), unless the authorization is terminated  or revoked sooner.       Influenza A by PCR NEGATIVE NEGATIVE Final   Influenza B by PCR NEGATIVE NEGATIVE Final    Comment: (NOTE) The Xpert Xpress SARS-CoV-2/FLU/RSV plus assay is intended as an aid in the diagnosis of influenza from Nasopharyngeal swab specimens and should not be used as a sole basis for treatment. Nasal washings and aspirates are unacceptable for Xpert Xpress SARS-CoV-2/FLU/RSV testing.  Fact Sheet for Patients: BloggerCourse.com  Fact Sheet for  Healthcare Providers: SeriousBroker.it  This test is not yet approved or cleared by the [place] FDA and has been authorized for detection and/or diagnosis of SARS-CoV-2 by FDA under an Emergency Use Authorization (EUA). This EUA will remain in effect (meaning this test can be used) for the duration of the COVID-19 declaration under Section 564(b)(1) of the Act, 21 U.S.C. section 360bbb-3(b)(1), unless the authorization is terminated or revoked.     Resp Syncytial Virus by PCR NEGATIVE NEGATIVE Final    Comment: (NOTE) Fact Sheet for Patients: BloggerCourse.com  Fact Sheet for Healthcare Providers: SeriousBroker.it  This test is not yet approved or cleared by the [place] FDA and has been authorized for detection and/or diagnosis of SARS-CoV-2 by FDA under an Emergency Use Authorization (EUA). This EUA will remain in effect (meaning this test can be used) for the duration of the COVID-19 declaration under Section 564(b)(1) of the Act, 21 U.S.C. section 360bbb-3(b)(1), unless the authorization is terminated or revoked.  Performed at [facility], [address] [place], [place] [address]      Radiological Exams on Admission: DG Chest 2 View Result Date: 11/04/2023 CLINICAL DATA:  Shortness of breath. EXAM: CHEST - 2 VIEW COMPARISON:  January 15, 2017 FINDINGS: The heart size and mediastinal contours are within normal limits. Hazy diffuse increased lung markings are seen with ill-defined multifocal airspace disease noted throughout the mid right lung. Mild involvement of the mid left lung and medial aspect of the left upper lobe is suspected. No pleural effusion or pneumothorax is identified. The visualized skeletal structures are unremarkable. IMPRESSION: Findings consistent with multifocal pneumonia. Follow-up to resolution and/or correlation with chest CT is recommended to  exclude the presence of an underlying neoplastic process. Electronically Signed   By: Aram Candela M.D.   On: 11/04/2023 22:47    Assessment/Plan Present on Admission:  Multifocal Pneumonia //  acute respiratory failure w/ hypoxia //  Asthma -Currently on 2 L oxygen, continued -Pneumonia order set initiated. -Blood cultures x 2 collected -IV vancomycin, cefepime and Flagyl given in ER.  IV Rocephin and doxycycline continued. -Nebulizes as needed.  Robitussin as needed. -CRP, procalcitonin a.m.   OSA //  Morbid obesity (HCC) -CPaP ordered   T2DM -Sliding scale insulin ordered -Trujeo continued at 35 units daily.  Patient at home takes 50 units daily   Hypercholesterolemia -Crestor resumed   HTN uncontrolled -Lisinopril resumed first dose now -As needed Lopressor   History of TIA -ASA resumed   Depression -Zoloft resumed  Jesalyn Finazzo 11/05/2023, 12:37 AM

## 2023-11-05 NOTE — ED Notes (Signed)
Pt given cup of coffee as requested.

## 2023-11-05 NOTE — ED Notes (Signed)
Cup given to pt and pt was advised if he coughs up any sputum to try and deposit it into the cup

## 2023-11-05 NOTE — Progress Notes (Addendum)
1550- Before pt arrival   pt bp noted to be in the 190's   Garfield Cornea charge Rn  made aware er nurse contacted  Er nurse stated they medicated for htn in Er   md Gherghe contacted per safety of pt  will tx to tele if htn increases  pt arrived to floor  Pt bp retaken  pt asymptomatic and in no distress  and vs in chart  This nurse spoke with md for telemetry but based on bp decreasing md  states monitor and if needed will tx at a later time  PRN orders in place for elevated bp   House supervisor laurie made aware of bp concerns and updated per current plan   1820- pt calm and in bed  sister at bedside   cpap from home arrived   respiratory made aware and aware of need to come to floor   and to be at bedside to adjust  pt re mains on 2 liters 02  [place] currently  and sats at 94 see orders for o2 range safety measures remain in place    1915- pt calm resting in bed  no acute distress noted   safety measures in place   bed locked in low position     call bell in reach report given to  oncoming nurse April

## 2023-11-05 NOTE — ED Notes (Signed)
Writer contacted hospitalist due to pt's blood pressure. Medic advised to start hydralazine.

## 2023-11-05 NOTE — Progress Notes (Signed)
Pt wearing his home CPAP 2 lpm of oxygen bled in by RT.

## 2023-11-06 ENCOUNTER — Other Ambulatory Visit (HOSPITAL_COMMUNITY): Payer: Self-pay

## 2023-11-06 DIAGNOSIS — J189 Pneumonia, unspecified organism: Secondary | ICD-10-CM | POA: Diagnosis not present

## 2023-11-06 LAB — GLUCOSE, CAPILLARY
Glucose-Capillary: 145 mg/dL — ABNORMAL HIGH (ref 70–99)
Glucose-Capillary: 105 mg/dL — ABNORMAL HIGH (ref 70–99)
Glucose-Capillary: 123 mg/dL — ABNORMAL HIGH (ref 70–99)
Glucose-Capillary: 142 mg/dL — ABNORMAL HIGH (ref 70–99)
Glucose-Capillary: 115 mg/dL — ABNORMAL HIGH (ref 70–99)
Glucose-Capillary: 126 mg/dL — ABNORMAL HIGH (ref 70–99)

## 2023-11-06 LAB — LEGIONELLA PNEUMOPHILA SEROGP 1 UR AG: L. pneumophila Serogp 1 Ur Ag: NEGATIVE

## 2023-11-06 MED ORDER — OXYMETAZOLINE HCL 0.05 % NA SOLN
1.0000 | Freq: Two times a day (BID) | NASAL | Status: DC
  Administered 2023-11-06 – 2023-11-07 (×2): 1 via NASAL
  Filled 2023-11-06: qty 15

## 2023-11-06 MED ORDER — AZITHROMYCIN 250 MG PO TABS
500.0000 mg | ORAL_TABLET | Freq: Every day | ORAL | Status: DC
  Administered 2023-11-07: 500 mg via ORAL
  Filled 2023-11-06: qty 2

## 2023-11-06 NOTE — Progress Notes (Signed)
   11/06/23 2313  BiPAP/CPAP/SIPAP  BiPAP/CPAP/SIPAP Pt Type Adult  BiPAP/CPAP/SIPAP Resmed  Mask Type Full face mask  Mask Size Large  FiO2 (%) 21 %  Patient Home Equipment Yes  Auto Titrate No  Safety Check Completed by RT for Home Unit Yes, no issues noted

## 2023-11-06 NOTE — Progress Notes (Signed)
PROGRESS NOTE  Michael Foster GNF:[id number] DOB: 08/04/1976 DOA: 11/04/2023 PCP: Orpha Bur, MD   LOS: 1 day   Brief Narrative / Interim history: 48 year old male with history of obesity, OSA on CPAP, DM2, HTN, comes into the hospital with worsening shortness of breath, increased wheezing.  This has been going on for the last few days.  He has been trying to use his inhaler at home without significant help.  Evaluation on admission revealed hypoxia requiring 2 L supplemental oxygen, and a chest x-ray showed multifocal pneumonia.  He was placed on antibiotics and admitted to the hospital   Subjective / 24h Interval events: He is overall feeling better.  Still coughing some.  Reports some nosebleeds when he blows his nose this morning.  Slept better  Assesement and Plan: Principal Problem:   Multifocal pneumonia Active Problems:   Hypercholesterolemia   OSA on CPAP   T2DM (type 2 diabetes mellitus) (HCC)   Morbid obesity (HCC)   H/O TIA (transient ischemic attack) and stroke   HTN (hypertension)   Principal problem Multifocal pneumonia, acute hypoxic respiratory failure-continue supportive care with ceftriaxone and azithromycin, continue nebulizers PRN.  Wean off to room air as able.  All viral workup negative -Clinically improving today -Encouraged more ambulation -Still febrile last night -Afrin for epistaxis   Active problems OSA-continue CPAP   Obesity, morbid-BMI 46, he would benefit from weight loss   DM2, poorly controlled-continue sliding scale, glargine  Lab Results  Component Value Date   HGBA1C 9.7 (H) 11/05/2023   CBG (last 3)  Recent Labs    11/05/23 1718 11/05/23 2243 11/06/23 0726  GLUCAP 145* 105* 123*     Essential hypertension-continue home lisinopril, blood pressure quite high yesterday but overall trends are improving.  Hydralazine was added   Hyperlipidemia-continue statin   History of TIA-hold aspirin due to epistaxis    Depression-continue home medications  Scheduled Meds:  heparin  5,000 Units Subcutaneous Q8H   hydrALAZINE  25 mg Oral Q8H   insulin aspart  0-15 Units Subcutaneous TID WC   insulin aspart  0-5 Units Subcutaneous QHS   insulin glargine-yfgn  35 Units Subcutaneous Daily   lisinopril  20 mg Oral Daily   melatonin  20 mg Oral QHS   oxymetazoline  1 spray Each Nare BID   rosuvastatin  20 mg Oral Daily   sertraline  50 mg Oral Daily   Continuous Infusions:  azithromycin 500 mg (11/06/23 0109)   cefTRIAXone (ROCEPHIN)  IV 2 g (11/06/23 0010)   PRN Meds:.acetaminophen **OR** acetaminophen, albuterol, hydrALAZINE, metoprolol tartrate, ondansetron **OR** ondansetron (ZOFRAN) IV, senna-docusate  Current Outpatient Medications  Medication Instructions   albuterol (VENTOLIN HFA) 108 (90 Base) MCG/ACT inhaler INHALE 2 PUFFS BY MOUTH EVERY 6 HOURS AS NEEDED FOR WHEEZE OR SHORTNESS OF BREATH   amoxicillin (AMOXIL) 500 mg, Oral, 3 times daily   amoxicillin-clavulanate (AUGMENTIN) 875-125 MG tablet 1 tablet, Oral, Every 12 hours   amphetamine-dextroamphetamine (ADDERALL XR) 20 MG 24 hr capsule 20 mg, Every morning   aspirin EC 81 mg, Oral, Daily   insulin aspart (FIASP) 10-20 Units, Subcutaneous, 2 times daily before meals   Jardiance 25 mg, Daily   lisinopril (ZESTRIL) 2.5 mg, Oral, Daily   Melatonin 10 mg, Oral, Daily at bedtime   metFORMIN (GLUCOPHAGE) 500 mg, Oral, 2 times daily with meals   metFORMIN (GLUCOPHAGE-XR) 1,000 mg, 2 times daily   Omega-3 Fatty Acids (FISH OIL) 1000 MG CAPS 1 capsule, Oral, Daily   ondansetron (  ZOFRAN-ODT) 4 mg, Oral, Every 8 hours PRN   rosuvastatin (CRESTOR) 5 MG tablet TAKE 1 TABLET BY MOUTH EVERY DAY   rosuvastatin (CRESTOR) 20 mg, Daily   sertraline (ZOLOFT) 50 MG tablet TAKE 1 TABLET BY MOUTH EVERY DAY   tadalafil (CIALIS) 10 MG tablet Take by mouth.   Toujeo Max SoloStar 50 Units, Subcutaneous, Daily   traMADol (ULTRAM) 50 mg, Oral, Every 6 hours PRN     Diet Orders (From admission, onward)     Start     Ordered   11/05/23 0043  Diet heart healthy/carb modified Room service appropriate? Yes; Fluid consistency: Thin  Diet effective now       Question Answer Comment  Diet-HS Snack? Nothing   Room service appropriate? Yes   Fluid consistency: Thin      11/05/23 0042            DVT prophylaxis: heparin injection 5,000 Units Start: 11/05/23 0600   Lab Results  Component Value Date   PLT 351 11/05/2023      Code Status: Full Code  Family Communication: no family at bedside   Status is: Inpatient Remains inpatient appropriate because: febrile, IV antibiotics  Level of care: Med-Surg  Consultants:  none  Objective: Vitals:   11/06/23 0100 11/06/23 0200 11/06/23 0305 11/06/23 1017  BP:   (!) 140/59 (!) 153/72  Pulse:   78 96  Resp:   18 20  Temp: 100 F (37.8 C) 98.8 F (37.1 C) 98.5 F (36.9 C) 99.6 F (37.6 C)  TempSrc: Oral Oral  Oral  SpO2:   96% 95%  Weight:      Height:       No intake or output data in the 24 hours ending 11/06/23 1029 Wt Readings from Last 3 Encounters:  11/04/23 (!) 142.9 kg  10/08/23 (!) 136.5 kg  07/27/23 (!) 148 kg    Examination:  Constitutional: NAD Eyes: no scleral icterus ENMT: Mucous membranes are moist.  Neck: normal, supple Respiratory: clear to auscultation bilaterally, no wheezing, no crackles. Cardiovascular: Regular rate and rhythm, no murmurs / rubs / gallops.  Abdomen: non distended, no tenderness. Bowel sounds positive.  Musculoskeletal: no clubbing / cyanosis.  Data Reviewed: I have independently reviewed following labs and imaging studies   CBC Recent Labs  Lab 11/04/23 2234 11/05/23 0138 11/05/23 0500  WBC 15.7* 16.4* 15.3*  HGB 13.9 12.9* 12.6*  HCT 45.1 41.6 41.6  PLT 395 377 351  MCV 85.3 85.4 86.5  MCH 26.3 26.5 26.2  MCHC 30.8 31.0 30.3  RDW 13.3 13.2 13.2  LYMPHSABS 2.6  --  2.0  MONOABS 0.8  --  0.9  EOSABS 0.3  --  0.2   BASOSABS 0.1  --  0.1    Recent Labs  Lab 11/04/23 2234 11/04/23 2304 11/04/23 2311 11/05/23 0138 11/05/23 0500  NA 141  --   --   --  137  K 3.7  --   --   --  3.9  CL 105  --   --   --  105  CO2 25  --   --   --  22  GLUCOSE 131*  --   --   --  158*  BUN 11  --   --   --  12  CREATININE 0.78  --   --  0.66 0.71  CALCIUM 8.7*  --   --   --  8.1*  AST 18  --   --   --   --  ALT 20  --   --   --   --   ALKPHOS 121  --   --   --   --   BILITOT 1.3*  --   --   --   --   ALBUMIN 3.6  --   --   --   --   DDIMER  --   --   --   --  1.07*  PROCALCITON  --   --   --   --  <0.10  LATICACIDVEN  --   --  1.2  --   --   INR  --  1.0  --   --   --   HGBA1C  --   --   --  9.7*  --   BNP 63.8  --   --   --   --     ------------------------------------------------------------------------------------------------------------------ No results for input(s): "CHOL", "HDL", "LDLCALC", "TRIG", "CHOLHDL", "LDLDIRECT" in the last 72 hours.  Lab Results  Component Value Date   HGBA1C 9.7 (H) 11/05/2023   ------------------------------------------------------------------------------------------------------------------ No results for input(s): "TSH", "T4TOTAL", "T3FREE", "THYROIDAB" in the last 72 hours.  Invalid input(s): "FREET3"  Cardiac Enzymes No results for input(s): "CKMB", "TROPONINI", "MYOGLOBIN" in the last 168 hours.  Invalid input(s): "CK" ------------------------------------------------------------------------------------------------------------------    Component Value Date/Time   BNP 63.8 11/04/2023 2234    CBG: Recent Labs  Lab 11/05/23 0844 11/05/23 1249 11/05/23 1718 11/05/23 2243 11/06/23 0726  GLUCAP 140* 161* 145* 105* 123*    Recent Results (from the past 240 hours)  Respiratory (~20 pathogens) panel by PCR     Status: None   Collection Time: 11/04/23  4:51 AM   Specimen: Nasopharyngeal Swab; Respiratory  Result Value Ref Range Status   Adenovirus NOT  DETECTED NOT DETECTED Final   Coronavirus 229E NOT DETECTED NOT DETECTED Final    Comment: (NOTE) The Coronavirus on the Respiratory Panel, DOES NOT test for the novel  Coronavirus (2019 nCoV)    Coronavirus HKU1 NOT DETECTED NOT DETECTED Final   Coronavirus NL63 NOT DETECTED NOT DETECTED Final   Coronavirus OC43 NOT DETECTED NOT DETECTED Final   Metapneumovirus NOT DETECTED NOT DETECTED Final   Rhinovirus / Enterovirus NOT DETECTED NOT DETECTED Final   Influenza A NOT DETECTED NOT DETECTED Final   Influenza B NOT DETECTED NOT DETECTED Final   Parainfluenza Virus 1 NOT DETECTED NOT DETECTED Final   Parainfluenza Virus 2 NOT DETECTED NOT DETECTED Final   Parainfluenza Virus 3 NOT DETECTED NOT DETECTED Final   Parainfluenza Virus 4 NOT DETECTED NOT DETECTED Final   Respiratory Syncytial Virus NOT DETECTED NOT DETECTED Final   Bordetella pertussis NOT DETECTED NOT DETECTED Final   Bordetella Parapertussis NOT DETECTED NOT DETECTED Final   Chlamydophila pneumoniae NOT DETECTED NOT DETECTED Final   Mycoplasma pneumoniae NOT DETECTED NOT DETECTED Final    Comment: Performed at [facility], [address] [place], [place] [address]  Resp panel by RT-PCR (RSV, Flu A&B, Covid) Anterior Nasal Swab     Status: None   Collection Time: 11/04/23 11:06 PM   Specimen: Anterior Nasal Swab  Result Value Ref Range Status   SARS Coronavirus 2 by RT PCR NEGATIVE NEGATIVE Final    Comment: (NOTE) SARS-CoV-2 target nucleic acids are NOT DETECTED.  The SARS-CoV-2 RNA is generally detectable in upper respiratory specimens during the acute phase of infection. The lowest concentration of SARS-CoV-2 viral copies this assay can detect is 138 copies/mL. A negative  result does not preclude SARS-Cov-2 infection and should not be used as the sole basis for treatment or other patient management decisions. A negative result may occur with  improper specimen collection/handling, submission of specimen  other than nasopharyngeal swab, presence of viral mutation(s) within the areas targeted by this assay, and inadequate number of viral copies(<138 copies/mL). A negative result must be combined with clinical observations, patient history, and epidemiological information. The expected result is Negative.  Fact Sheet for Patients:  BloggerCourse.com  Fact Sheet for Healthcare Providers:  SeriousBroker.it  This test is no t yet approved or cleared by the [place] FDA and  has been authorized for detection and/or diagnosis of SARS-CoV-2 by FDA under an Emergency Use Authorization (EUA). This EUA will remain  in effect (meaning this test can be used) for the duration of the COVID-19 declaration under Section 564(b)(1) of the Act, 21 U.S.C.section 360bbb-3(b)(1), unless the authorization is terminated  or revoked sooner.       Influenza A by PCR NEGATIVE NEGATIVE Final   Influenza B by PCR NEGATIVE NEGATIVE Final    Comment: (NOTE) The Xpert Xpress SARS-CoV-2/FLU/RSV plus assay is intended as an aid in the diagnosis of influenza from Nasopharyngeal swab specimens and should not be used as a sole basis for treatment. Nasal washings and aspirates are unacceptable for Xpert Xpress SARS-CoV-2/FLU/RSV testing.  Fact Sheet for Patients: BloggerCourse.com  Fact Sheet for Healthcare Providers: SeriousBroker.it  This test is not yet approved or cleared by the [place] FDA and has been authorized for detection and/or diagnosis of SARS-CoV-2 by FDA under an Emergency Use Authorization (EUA). This EUA will remain in effect (meaning this test can be used) for the duration of the COVID-19 declaration under Section 564(b)(1) of the Act, 21 U.S.C. section 360bbb-3(b)(1), unless the authorization is terminated or revoked.     Resp Syncytial Virus by PCR NEGATIVE NEGATIVE Final     Comment: (NOTE) Fact Sheet for Patients: BloggerCourse.com  Fact Sheet for Healthcare Providers: SeriousBroker.it  This test is not yet approved or cleared by the [place] FDA and has been authorized for detection and/or diagnosis of SARS-CoV-2 by FDA under an Emergency Use Authorization (EUA). This EUA will remain in effect (meaning this test can be used) for the duration of the COVID-19 declaration under Section 564(b)(1) of the Act, 21 U.S.C. section 360bbb-3(b)(1), unless the authorization is terminated or revoked.  Performed at [facility], [address] [place], [place] [address]   Blood Culture (routine x 2)     Status: None (Preliminary result)   Collection Time: 11/04/23 11:44 PM   Specimen: BLOOD  Result Value Ref Range Status   Specimen Description   Final    BLOOD BLOOD RIGHT HAND Performed at [facility], [address] [place], [place] [address]    Special Requests   Final    BOTTLES DRAWN AEROBIC ONLY Blood Culture results may not be optimal due to an inadequate volume of blood received in culture bottles Performed at [facility], [address] [place], [place] [address]    Culture   Final    NO GROWTH 1 DAY Performed at [facility], [address] [place], [place] [address]    Report Status PENDING  Incomplete  Expectorated Sputum Assessment w Gram Stain, Rflx to Resp Cult     Status: None   Collection Time: 11/05/23 10:29 AM   Specimen: Sputum  Result Value Ref Range Status  Specimen Description SPUTUM  Final   Special Requests NONE  Final   Sputum evaluation   Final    THIS SPECIMEN IS ACCEPTABLE FOR SPUTUM CULTURE Performed at [facility], [address] [place], [place] [address]    Report Status 11/05/2023 FINAL  Final  Culture, Respiratory w Gram Stain     Status: None (Preliminary result)   Collection  Time: 11/05/23 10:29 AM   Specimen: SPU  Result Value Ref Range Status   Specimen Description   Final    SPUTUM Performed at [facility], [address], [place], [place] [address]    Special Requests   Final    NONE Reflexed from [id number] Performed at [facility] [address], [place], [place] [address]    Gram Stain   Final    NO WBC SEEN NO ORGANISMS SEEN Performed at [facility], [address] [place], [place] [address]    Culture PENDING  Incomplete   Report Status PENDING  Incomplete     Radiology Studies: No results found.   Pamella Pert, MD, PhD [facility]  Between 7 am - 7 pm I am available, please contact me via Amion (for emergencies) or Securechat (non urgent messages)  Between 7 pm - 7 am I am not available, please contact night coverage MD/APP via Amion

## 2023-11-06 NOTE — Progress Notes (Signed)
   11/06/23 1114  TOC Brief Assessment  Insurance and Status Reviewed  Patient has primary care physician Yes  Home environment has been reviewed Resides alone in an apartment  Prior level of function: Independent with ADLs at baseline  Prior/Current Home Services No current home services  Social Drivers of Health Review SDOH reviewed no interventions necessary  Readmission risk has been reviewed Yes  Transition of care needs no transition of care needs at this time

## 2023-11-06 NOTE — Progress Notes (Signed)
Patient ambulated 180 feet and oxygen saturation decreased 88-90% on room air.

## 2023-11-06 NOTE — Inpatient Diabetes Management (Signed)
Inpatient Diabetes Program Recommendations  AACE/ADA: New Consensus Statement on Inpatient Glycemic Control   Target Ranges:  Prepandial:   less than 140 mg/dL      Peak postprandial:   less than 180 mg/dL (1-2 hours)      Critically ill patients:  140 - 180 mg/dL    Latest Reference Range & Units 11/05/23 08:44 11/05/23 12:49 11/05/23 17:18 11/05/23 22:43 11/06/23 07:26 11/06/23 11:31  Glucose-Capillary 70 - 99 mg/dL 308 (H) 657 (H) 846 (H) 105 (H) 123 (H) 142 (H)   Review of Glycemic Control  Diabetes history: DM2 Outpatient Diabetes medications: Toujeo 50 units daily, Jardiance daily (ran out of [place] the 2nd week of January) Current orders for Inpatient glycemic control: Semglee 35 units daily, Novolog 0-15 units TID with meals, Novolog 0-5 units QHS  Inpatient Diabetes Program Recommendations:    Outpatient DM: Patient was using Dexcom G7 sensor but his Rx ran out. Please provide Rx for Dexcom G7 sensors (#[id number]) at discharge. Patient was taking London Pepper (ran out 2nd week of January) and when he went to get it refilled in January it was going to cost $600 so he could not afford to get it.  Outpatient TOC pharmacy was unable to see if a different SGLT-2 medication was covered or not because patient has to get medications [facility].   May want to provide Rx for short acting insulin (pens) at discharge and have patient follow up with PCP about getting SGLT-2 medication covered.   NOTE: Spoke with patient at bedside about diabetes and home regimen for diabetes control. Patient reports he recently got a PCP and he had been taking Toujeo 50 units QAM and Jardiance daily for DM control until he ran out of [place] the 2nd week of January. Patient states that when he went to get it refilled he was told that it would be $600 copay for 1 month so he was not able to get it filled. Patient states that when he was taking the Jardiance, his glucose was fairly well controled. Patient reports  that he has been exercising daily trying to keep DM controlled since he is unable to take the Jardiance due to cost. Patient had been using Dexcom G7 sensor for glucose monitoring but his prescription ran out and he has not gotten a new prescription yet. Patient reports he checks glucose QAM before eating anything and it has been 140-180's mg/dl; patient showed me glucose data on  his phone with glucose noted to be in the ranges he specified.  Patient reports that his last A1C was 13%.  Discussed A1C results (9.7% on 11/05/23) and explained that current A1C indicates an average glucose of 232 mg/dl over the past 2-3 months.  Patient was encouraged that his A1C had came down so much with using the Jardiance.  Discussed glucose and A1C goals. Discussed importance of checking CBGs and maintaining good CBG control to prevent long-term and short-term complications.  Patient reports that he has a separate prescription coverage (not under BCBS) for prescriptions.  Informed patient that I would see if outpatient [facility]  pharmacy could see if there was another SGLT-2 medication that is covered better and or if Candie Mile was covered and will ask if attending provider will provide Rx for Dexcom G7 sensors at discharge.  Patient verbalized understanding of information discussed and reports no further questions at this time related to diabetes.  Sent chat message to [facility]patient [facility] pharmacy regarding patient's prescription coverage to see if London Pepper is  preferred SGLT-2 or if there was a different one that was covered better; also asked if Candie Mile was covered or was there a different preferred short acting insulin. Outpatient [facility] get [facility]'s [facility] coverage information but was not able to see cost and coverage of medications because they [facility].  Thanks, Orlando Penner, RN, MSN, CDE Diabetes Coordinator Inpatient Diabetes Program [phone] (Team Pager)

## 2023-11-07 ENCOUNTER — Other Ambulatory Visit (HOSPITAL_COMMUNITY): Payer: Self-pay

## 2023-11-07 DIAGNOSIS — J189 Pneumonia, unspecified organism: Secondary | ICD-10-CM | POA: Diagnosis not present

## 2023-11-07 LAB — BASIC METABOLIC PANEL
Anion gap: 12 (ref 5–15)
BUN: 14 mg/dL (ref 6–20)
CO2: 22 mmol/L (ref 22–32)
Calcium: 8.8 mg/dL — ABNORMAL LOW (ref 8.9–10.3)
Chloride: 105 mmol/L (ref 98–111)
Creatinine, Ser: 0.8 mg/dL (ref 0.61–1.24)
GFR, Estimated: >60 mL/min (ref >60)
Glucose, Bld: 114 mg/dL — ABNORMAL HIGH (ref 70–99)
Potassium: 3.9 mmol/L (ref 3.5–5.1)
Sodium: 139 mmol/L (ref 135–145)

## 2023-11-07 LAB — CBC
HCT: 38.5 % — ABNORMAL LOW (ref 39.0–52.0)
Hemoglobin: 11.7 g/dL — ABNORMAL LOW (ref 13.0–17.0)
MCH: 26.1 pg (ref 26.0–34.0)
MCHC: 30.4 g/dL (ref 30.0–36.0)
MCV: 85.9 fL (ref 80.0–100.0)
Platelets: 347 10*3/uL (ref 150–400)
RBC: 4.48 MIL/uL (ref 4.22–5.81)
RDW: 13.3 % (ref 11.5–15.5)
WBC: 11.4 10*3/uL — ABNORMAL HIGH (ref 4.0–10.5)
nRBC: 0 % (ref 0.0–0.2)

## 2023-11-07 LAB — CULTURE, RESPIRATORY W GRAM STAIN: Gram Stain: NONE SEEN

## 2023-11-07 LAB — GLUCOSE, CAPILLARY: Glucose-Capillary: 112 mg/dL — ABNORMAL HIGH (ref 70–99)

## 2023-11-07 LAB — MAGNESIUM: Magnesium: 2.1 mg/dL (ref 1.7–2.4)

## 2023-11-07 MED ORDER — ROBAFEN DM 20-200 MG/20ML PO LIQD
5.0000 mL | ORAL | 0 refills | Status: AC | PRN
Start: 2023-11-07 — End: ?
  Filled 2023-11-07: qty 118, 6d supply, fill #0

## 2023-11-07 MED ORDER — DOXYCYCLINE HYCLATE 100 MG PO CAPS
100.0000 mg | ORAL_CAPSULE | Freq: Two times a day (BID) | ORAL | 0 refills | Status: AC
  Filled 2023-11-07: qty 10, 5d supply, fill #0

## 2023-11-07 NOTE — Plan of Care (Signed)

## 2023-11-07 NOTE — Plan of Care (Signed)
  Problem: Education: Goal: Ability to describe self-care measures that may prevent or decrease complications (Diabetes Survival Skills Education) will improve Outcome: Adequate for Discharge Goal: Individualized Educational Video(s) Outcome: Adequate for Discharge   Problem: Coping: Goal: Ability to adjust to condition or change in health will improve Outcome: Adequate for Discharge   Problem: Fluid Volume: Goal: Ability to maintain a balanced intake and output will improve Outcome: Adequate for Discharge   Problem: Health Behavior/Discharge Planning: Goal: Ability to identify and utilize available resources and services will improve Outcome: Adequate for Discharge Goal: Ability to manage health-related needs will improve Outcome: Adequate for Discharge   Problem: Metabolic: Goal: Ability to maintain appropriate glucose levels will improve Outcome: Adequate for Discharge   Problem: Nutritional: Goal: Maintenance of adequate nutrition will improve Outcome: Adequate for Discharge Goal: Progress toward achieving an optimal weight will improve Outcome: Adequate for Discharge   Problem: Skin Integrity: Goal: Risk for impaired skin integrity will decrease Outcome: Adequate for Discharge   Problem: Tissue Perfusion: Goal: Adequacy of tissue perfusion will improve Outcome: Adequate for Discharge   Problem: Education: Goal: Knowledge of General Education information will improve Description: Including pain rating scale, medication(s)/side effects and non-pharmacologic comfort measures Outcome: Adequate for Discharge   Problem: Health Behavior/Discharge Planning: Goal: Ability to manage health-related needs will improve Outcome: Adequate for Discharge   Problem: Clinical Measurements: Goal: Ability to maintain clinical measurements within normal limits will improve Outcome: Adequate for Discharge Goal: Will remain free from infection Outcome: Adequate for Discharge Goal:  Diagnostic test results will improve Outcome: Adequate for Discharge Goal: Respiratory complications will improve Outcome: Adequate for Discharge Goal: Cardiovascular complication will be avoided Outcome: Adequate for Discharge   Problem: Activity: Goal: Risk for activity intolerance will decrease Outcome: Adequate for Discharge   Problem: Nutrition: Goal: Adequate nutrition will be maintained Outcome: Adequate for Discharge   Problem: Coping: Goal: Level of anxiety will decrease Outcome: Adequate for Discharge   Problem: Elimination: Goal: Will not experience complications related to bowel motility Outcome: Adequate for Discharge Goal: Will not experience complications related to urinary retention Outcome: Adequate for Discharge   Problem: Pain Managment: Goal: General experience of comfort will improve and/or be controlled Outcome: Adequate for Discharge   Problem: Safety: Goal: Ability to remain free from injury will improve Outcome: Adequate for Discharge   Problem: Skin Integrity: Goal: Risk for impaired skin integrity will decrease Outcome: Adequate for Discharge   Problem: Activity: Goal: Ability to tolerate increased activity will improve Outcome: Adequate for Discharge   Problem: Clinical Measurements: Goal: Ability to maintain a body temperature in the normal range will improve Outcome: Adequate for Discharge   Problem: Respiratory: Goal: Ability to maintain adequate ventilation will improve Outcome: Adequate for Discharge Goal: Ability to maintain a clear airway will improve Outcome: Adequate for Discharge

## 2023-11-07 NOTE — Discharge Instructions (Signed)
Follow with Michael Bur, MD in 5-7 days  Please get a complete blood count and chemistry panel checked by your Primary MD at your next visit, and again as instructed by your Primary MD. Please get your medications reviewed and adjusted by your Primary MD.  Please request your Primary MD to go over all Hospital Tests and Procedure/Radiological results at the follow up, please get all Hospital records sent to your Prim MD by signing hospital release before you go home.  In some cases, there will be blood work, cultures and biopsy results pending at the time of your discharge. Please request that your primary care M.D. goes through all the records of your hospital data and follows up on these results.  If you had Pneumonia of Lung problems at the Hospital: Please get a 2 view Chest X ray done in 6-8 weeks after hospital discharge or sooner if instructed by your Primary MD.  If you have Congestive Heart Failure: Please call your Cardiologist or Primary MD anytime you have any of the following symptoms:  1) 3 pound weight gain in 24 hours or 5 pounds in 1 week  2) shortness of breath, with or without a dry hacking cough  3) swelling in the hands, feet or stomach  4) if you have to sleep on extra pillows at night in order to breathe  Follow cardiac low salt diet and 1.5 lit/day fluid restriction.  If you have diabetes Accuchecks 4 times/day, Once in AM empty stomach and then before each meal. Log in all results and show them to your primary doctor at your next visit. If any glucose reading is under 80 or above 300 call your primary MD immediately.  If you have Seizure/Convulsions/Epilepsy: Please do not drive, operate heavy machinery, participate in activities at heights or participate in high speed sports until you have seen by Primary MD or a Neurologist and advised to do so again. Per [facility] statutes, patients with seizures are not allowed to drive until they have been  seizure-free for six months.  Use caution when using heavy equipment or power tools. Avoid working on ladders or at heights. Take showers instead of baths. Ensure the water temperature is not too high on the home water heater. Do not go swimming alone. Do not lock yourself in a room alone (i.e. bathroom). When caring for infants or small children, sit down when holding, feeding, or changing them to minimize risk of injury to the child in the event you have a seizure. Maintain good sleep hygiene. Avoid alcohol.   If you had Gastrointestinal Bleeding: Please ask your Primary MD to check a complete blood count within one week of discharge or at your next visit. Your endoscopic/colonoscopic biopsies that are pending at the time of discharge, will also need to followed by your Primary MD.  Get Medicines reviewed and adjusted. Please take all your medications with you for your next visit with your Primary MD  Please request your Primary MD to go over all hospital tests and procedure/radiological results at the follow up, please ask your Primary MD to get all Hospital records sent to his/her office.  If you experience worsening of your admission symptoms, develop shortness of breath, life threatening emergency, suicidal or homicidal thoughts you must seek medical attention immediately by calling 911 or calling your MD immediately  if symptoms less severe.  You must read complete instructions/literature along with all the possible adverse reactions/side effects for all the Medicines you  take and that have been prescribed to you. Take any new Medicines after you have completely understood and accpet all the possible adverse reactions/side effects.   Do not drive or operate heavy machinery when taking Pain medications.   Do not take more than prescribed Pain, Sleep and Anxiety Medications  Special Instructions: If you have smoked or chewed Tobacco  in the last 2 yrs please stop smoking, stop any regular  Alcohol  and or any Recreational drug use.  Wear Seat belts while driving.  Please note You were cared for by a hospitalist during your hospital stay. If you have any questions about your discharge medications or the care you received while you were in the hospital after you are discharged, you can call the unit and asked to speak with the hospitalist on call if the hospitalist that took care of you is not available. Once you are discharged, your primary care physician will handle any further medical issues. Please note that NO REFILLS for any discharge medications will be authorized once you are discharged, as it is imperative that you return to your primary care physician (or establish a relationship with a primary care physician if you do not have one) for your aftercare needs so that they can reassess your need for medications and monitor your lab values.  You can reach the hospitalist office at phone [phone] or fax [phone]   If you do not have a primary care physician, you can call [phone] for a physician referral.  Activity: As tolerated with Full fall precautions use walker/cane & assistance as needed    Diet: regular  Disposition Home

## 2023-11-07 NOTE — Inpatient Diabetes Management (Signed)
Inpatient Diabetes Program Recommendations  AACE/ADA: New Consensus Statement on Inpatient Glycemic Control  Target Ranges:  Prepandial:   less than 140 mg/dL      Peak postprandial:   less than 180 mg/dL (1-2 hours)      Critically ill patients:  140 - 180 mg/dL    Latest Reference Range & Units 11/06/23 07:26 11/06/23 11:31 11/06/23 16:42 11/06/23 20:50 11/07/23 07:57  Glucose-Capillary 70 - 99 mg/dL 161 (H) 096 (H) 045 (H) 126 (H) 112 (H)    Latest Reference Range & Units 11/05/23 01:38  Hemoglobin A1C 4.8 - 5.6 % 9.7 (H)   Review of Glycemic Control  Diabetes history: DM2 Outpatient Diabetes medications: Toujeo 50 units daily, Jardiance daily (ran out of [place] the 2nd week of January) Current orders for Inpatient glycemic control: Semglee 35 units daily, Novolog 0-15 units TID with meals, Novolog 0-5 units QHS   Inpatient Diabetes Program Recommendations:     HbgA1C: A1C 9.7% on 11/05/23 indicating an average glucose of  over the past 2-3 months. Patient would like to continue with just taking Toujeo 50 units daily and lifestyle modifications for DM control. He has recently made big changes with diet and exercise and feels that his glucose has significantly improved outpatient with the changes he has made.  NOTE: Spoke with patient over the phone to inquire about whether he called his prescription coverage to ask about preferred SGLT-2 medication.  Patient reports that he did call and they cover Jardiance and Marcelline Deist but the reason it was going to cost him so much when he went to get it refilled was because of his $3,000 deductible. Patient states that for right now he would like to just take the [facility] and he plans to continue with the dietary changes (cut out rice and limiting carbs) and exercise. He has an appointment with his doctor on 11/18/23 so he will see how glucose is trending and will discuss DM management with PCP.  Discussed glucose trends as inpatient on Semglee 35 units  daily and Novolog correction. Commended patient for the improvement and changes he has recently made to get DM under control.  Patient appreciative of call and has no questions at this time.  Thanks, Orlando Penner, RN, MSN, CDCES Diabetes Coordinator Inpatient Diabetes Program [phone] (Team Pager from 8am to 5pm)

## 2023-11-07 NOTE — Discharge Summary (Signed)
Physician Discharge Summary  Michael Foster LKG:[id number] DOB: 13-Jul-1976 DOA: 11/04/2023  PCP: Orpha Bur, MD  Admit date: 11/04/2023 Discharge date: 11/07/2023  Admitted From: home Disposition:  home  Recommendations for Outpatient Follow-up:  Follow up with PCP in 1-2 weeks  Home Health: none Equipment/Devices: none  Discharge Condition: stable CODE STATUS: Full code Diet Orders (From admission, onward)     Start     Ordered   11/05/23 0043  Diet heart healthy/carb modified Room service appropriate? Yes; Fluid consistency: Thin  Diet effective now       Question Answer Comment  Diet-HS Snack? Nothing   Room service appropriate? Yes   Fluid consistency: Thin      11/05/23 0042            HPI: Per admitting MD, This is a 48 year old male with history of extreme morbid obesity, GERD, OSA on CPAP, T2DM, asthma, HTN, and PUD.  Over the weekend the patient found he could not take a deep breath.  Today it became particularly bad when he was really out of breath.  His asthma pump just started using was recognized illness.  Endorses mild fever, not much coughing or wheezing.  He came to the ER.   Hospital Course / Discharge diagnoses: Principal Problem:   Multifocal pneumonia Active Problems:   Hypercholesterolemia   OSA on CPAP   T2DM (type 2 diabetes mellitus) (HCC)   Morbid obesity (HCC)   H/O TIA (transient ischemic attack) and stroke   HTN (hypertension)   Principal problem Multifocal pneumonia, acute hypoxic respiratory failure -patient was admitted to the hospital with hypoxia in the setting of multifocal pneumonia.  He was placed on antibiotics, supplemental oxygen, and improved with treatment.  He was weaned off to room air, now ambulating in the hallway without further difficulties.  He will be converted to oral antibiotics to complete the course home.  He will be discharged in stable condition, and has outpatient follow-up next week  Active  problems OSA-continue CPAP Obesity, morbid-BMI 46, he would benefit from weight loss DM2, poorly controlled-continue sliding scale, glargine Essential hypertension-continue home medications  Hyperlipidemia-continue statin History of TIA- on aspirin Depression-continue home medications  Sepsis ruled out   Discharge Instructions   Allergies as of 11/07/2023       Reactions   Semaglutide Other (See Comments)   Pancreatitis - Emergency Dept Visit only   Slo-bid Gyrocaps [theophylline] Other (See Comments)   Unknown childhood reaction        Medication List     TAKE these medications    albuterol 108 (90 Base) MCG/ACT inhaler Commonly known as: VENTOLIN HFA INHALE 2 PUFFS BY MOUTH EVERY 6 HOURS AS NEEDED FOR WHEEZE OR SHORTNESS OF BREATH   amphetamine-dextroamphetamine 20 MG 24 hr capsule Commonly known as: ADDERALL XR Take 20 mg by mouth every morning.   aspirin EC 81 MG tablet Take 81 mg by mouth at bedtime.   cetirizine 10 MG tablet Commonly known as: ZYRTEC Take 10 mg by mouth at bedtime.   CINNAMON PO Take 1 capsule by mouth at bedtime.   cyanocobalamin 1000 MCG tablet Commonly known as: VITAMIN B12 Take 1,000 mcg by mouth at bedtime.   docusate sodium 100 MG capsule Commonly known as: COLACE Take 100 mg by mouth at bedtime.   doxycycline 100 MG capsule Commonly known as: VIBRAMYCIN Take 1 capsule (100 mg total) by mouth 2 (two) times daily.   Fish Oil 1200 MG Caps Take 1,200 mg  by mouth at bedtime.   GARLIC 1500 PO Take 1,500 mg by mouth at bedtime.   guaiFENesin-dextromethorphan 100-10 MG/5ML syrup Commonly known as: ROBITUSSIN DM Take 5 mLs by mouth every 4 (four) hours as needed for cough.   insulin aspart 100 UNIT/ML FlexTouch Pen Commonly known as: FIASP Inject 10-20 Units into the skin 2 (two) times daily before a meal.   Jardiance 25 MG Tabs tablet Generic drug: empagliflozin Take 25 mg by mouth daily.   lisinopril 2.5 MG  tablet Commonly known as: ZESTRIL TAKE 1 TABLET BY MOUTH EVERY DAY   Melatonin 10 MG Tabs Take 20 mg by mouth at bedtime.   metFORMIN 500 MG 24 hr tablet Commonly known as: GLUCOPHAGE-XR Take 1,000 mg by mouth 2 (two) times daily.   ondansetron 4 MG disintegrating tablet Commonly known as: ZOFRAN-ODT Take 1 tablet (4 mg total) by mouth every 8 (eight) hours as needed for nausea or vomiting.   OVER THE COUNTER MEDICATION Take 200 mg by mouth at bedtime. L theanine 200 mg   Potassium 99 MG Tabs Take 99 mg by mouth at bedtime.   rosuvastatin 20 MG tablet Commonly known as: CRESTOR Take 20 mg by mouth daily.   sertraline 50 MG tablet Commonly known as: ZOLOFT TAKE 1 TABLET BY MOUTH EVERY DAY   tadalafil 10 MG tablet Commonly known as: CIALIS Take 10 mg by mouth daily as needed for erectile dysfunction.   Toujeo Max SoloStar 300 UNIT/ML Solostar Pen Generic drug: insulin glargine (2 Unit Dial) Inject 50 Units into the skin daily.   vitamin C 1000 MG tablet Take 1,000 mg by mouth at bedtime.   Zinc 30 MG Caps Take 30 mg by mouth at bedtime.        Follow-up Information     Orpha Bur, MD Follow up in 1 week(s).   Specialty: Family Medicine Contact information: [address] [place] [address] [phone]                 Consultations: none  Procedures/Studies:  DG Chest 2 View Result Date: 11/04/2023 CLINICAL DATA:  Shortness of breath. EXAM: CHEST - 2 VIEW COMPARISON:  January 15, 2017 FINDINGS: The heart size and mediastinal contours are within normal limits. Hazy diffuse increased lung markings are seen with ill-defined multifocal airspace disease noted throughout the mid right lung. Mild involvement of the mid left lung and medial aspect of the left upper lobe is suspected. No pleural effusion or pneumothorax is identified. The visualized skeletal structures are unremarkable. IMPRESSION: Findings consistent with multifocal pneumonia.  Follow-up to resolution and/or correlation with chest CT is recommended to exclude the presence of an underlying neoplastic process. Electronically Signed   By: Aram Candela M.D.   On: 11/04/2023 22:47   US Abdomen Limited RUQ (LIVER/GB) Result Date: 10/08/2023 CLINICAL DATA:  Right upper quadrant pain since Monday postprandial EXAM: ULTRASOUND ABDOMEN LIMITED RIGHT UPPER QUADRANT COMPARISON:  Ultrasound 07/27/2023. FINDINGS: Gallbladder: No gallstones or wall thickening visualized. No sonographic Murphy sign noted by sonographer. Common bile duct: Diameter: 3 mm Liver: Echogenic hepatic parenchyma diffusely. There is some presumed fatty sparing along the margin of the gallbladder. Portal vein is patent on color Doppler imaging with normal direction of blood flow towards the liver. Other: None. IMPRESSION: No gallstones or ductal dilatation. Fatty liver infiltration with presumed sparing along the gallbladder fossa margin. Electronically Signed   By: Karen Kays M.D.   On: 10/08/2023 17:24     Subjective: -  no chest pain, shortness of breath, no abdominal pain, nausea or vomiting.   Discharge Exam: BP (!) 142/65 (BP Location: Left Arm)   Pulse 73   Temp 98.5 F (36.9 C) (Oral)   Resp 20   Ht 5\' 9"  (1.753 m)   Wt (!) 142.9 kg   SpO2 96%   BMI 46.52 kg/m   General: Pt is alert, awake, not in acute distress Cardiovascular: RRR, S1/S2 +, no rubs, no gallops Respiratory: CTA bilaterally, no wheezing, no rhonchi Abdominal: Soft, NT, ND, bowel sounds + Extremities: no edema, no cyanosis    The results of significant diagnostics from this hospitalization (including imaging, microbiology, ancillary and laboratory) are listed below for reference.     Microbiology: Recent Results (from the past 240 hours)  Respiratory (~20 pathogens) panel by PCR     Status: None   Collection Time: 11/04/23  4:51 AM   Specimen: Nasopharyngeal Swab; Respiratory  Result Value Ref Range Status    Adenovirus NOT DETECTED NOT DETECTED Final   Coronavirus 229E NOT DETECTED NOT DETECTED Final    Comment: (NOTE) The Coronavirus on the Respiratory Panel, DOES NOT test for the novel  Coronavirus (2019 nCoV)    Coronavirus HKU1 NOT DETECTED NOT DETECTED Final   Coronavirus NL63 NOT DETECTED NOT DETECTED Final   Coronavirus OC43 NOT DETECTED NOT DETECTED Final   Metapneumovirus NOT DETECTED NOT DETECTED Final   Rhinovirus / Enterovirus NOT DETECTED NOT DETECTED Final   Influenza A NOT DETECTED NOT DETECTED Final   Influenza B NOT DETECTED NOT DETECTED Final   Parainfluenza Virus 1 NOT DETECTED NOT DETECTED Final   Parainfluenza Virus 2 NOT DETECTED NOT DETECTED Final   Parainfluenza Virus 3 NOT DETECTED NOT DETECTED Final   Parainfluenza Virus 4 NOT DETECTED NOT DETECTED Final   Respiratory Syncytial Virus NOT DETECTED NOT DETECTED Final   Bordetella pertussis NOT DETECTED NOT DETECTED Final   Bordetella Parapertussis NOT DETECTED NOT DETECTED Final   Chlamydophila pneumoniae NOT DETECTED NOT DETECTED Final   Mycoplasma pneumoniae NOT DETECTED NOT DETECTED Final    Comment: Performed at [facility], [address] [place], [place] [address]  Resp panel by RT-PCR (RSV, Flu A&B, Covid) Anterior Nasal Swab     Status: None   Collection Time: 11/04/23 11:06 PM   Specimen: Anterior Nasal Swab  Result Value Ref Range Status   SARS Coronavirus 2 by RT PCR NEGATIVE NEGATIVE Final    Comment: (NOTE) SARS-CoV-2 target nucleic acids are NOT DETECTED.  The SARS-CoV-2 RNA is generally detectable in upper respiratory specimens during the acute phase of infection. The lowest concentration of SARS-CoV-2 viral copies this assay can detect is 138 copies/mL. A negative result does not preclude SARS-Cov-2 infection and should not be used as the sole basis for treatment or other patient management decisions. A negative result may occur with  improper specimen collection/handling, submission of  specimen other than nasopharyngeal swab, presence of viral mutation(s) within the areas targeted by this assay, and inadequate number of viral copies(<138 copies/mL). A negative result must be combined with clinical observations, patient history, and epidemiological information. The expected result is Negative.  Fact Sheet for Patients:  BloggerCourse.com  Fact Sheet for Healthcare Providers:  SeriousBroker.it  This test is no t yet approved or cleared by the [place] FDA and  has been authorized for detection and/or diagnosis of SARS-CoV-2 by FDA under an Emergency Use Authorization (EUA). This EUA will remain  in effect (meaning this  test can be used) for the duration of the COVID-19 declaration under Section 564(b)(1) of the Act, 21 U.S.C.section 360bbb-3(b)(1), unless the authorization is terminated  or revoked sooner.       Influenza A by PCR NEGATIVE NEGATIVE Final   Influenza B by PCR NEGATIVE NEGATIVE Final    Comment: (NOTE) The Xpert Xpress SARS-CoV-2/FLU/RSV plus assay is intended as an aid in the diagnosis of influenza from Nasopharyngeal swab specimens and should not be used as a sole basis for treatment. Nasal washings and aspirates are unacceptable for Xpert Xpress SARS-CoV-2/FLU/RSV testing.  Fact Sheet for Patients: BloggerCourse.com  Fact Sheet for Healthcare Providers: SeriousBroker.it  This test is not yet approved or cleared by the [place] FDA and has been authorized for detection and/or diagnosis of SARS-CoV-2 by FDA under an Emergency Use Authorization (EUA). This EUA will remain in effect (meaning this test can be used) for the duration of the COVID-19 declaration under Section 564(b)(1) of the Act, 21 U.S.C. section 360bbb-3(b)(1), unless the authorization is terminated or revoked.     Resp Syncytial Virus by PCR NEGATIVE NEGATIVE Final     Comment: (NOTE) Fact Sheet for Patients: BloggerCourse.com  Fact Sheet for Healthcare Providers: SeriousBroker.it  This test is not yet approved or cleared by the [place] FDA and has been authorized for detection and/or diagnosis of SARS-CoV-2 by FDA under an Emergency Use Authorization (EUA). This EUA will remain in effect (meaning this test can be used) for the duration of the COVID-19 declaration under Section 564(b)(1) of the Act, 21 U.S.C. section 360bbb-3(b)(1), unless the authorization is terminated or revoked.  Performed at [facility], [address], [place], [place] [address]   Blood Culture (routine x 2)     Status: None (Preliminary result)   Collection Time: 11/04/23 11:44 PM   Specimen: BLOOD  Result Value Ref Range Status   Specimen Description   Final    BLOOD BLOOD RIGHT HAND Performed at [facility], [address] [place], [place] [address]    Special Requests   Final    BOTTLES DRAWN AEROBIC ONLY Blood Culture results may not be optimal due to an inadequate volume of blood received in culture bottles Performed at [facility], [address] [place], [place] [address]    Culture   Final    NO GROWTH 2 DAYS Performed at [facility], [address], [place], [place] [address]    Report Status PENDING  Incomplete  Expectorated Sputum Assessment w Gram Stain, Rflx to Resp Cult     Status: None   Collection Time: 11/05/23 10:29 AM   Specimen: Sputum  Result Value Ref Range Status   Specimen Description SPUTUM  Final   Special Requests NONE  Final   Sputum evaluation   Final    THIS SPECIMEN IS ACCEPTABLE FOR SPUTUM CULTURE Performed at [facility], [address], [place], [place] [address]    Report Status 11/05/2023 FINAL  Final  Culture, Respiratory w Gram Stain     Status: None (Preliminary result)    Collection Time: 11/05/23 10:29 AM   Specimen: SPU  Result Value Ref Range Status   Specimen Description   Final    SPUTUM Performed at [facility], [address], [place], [place] [address]    Special Requests   Final    NONE Reflexed from [id number] Performed at [facility], [address] [place],  [place] [address]    Gram Stain NO WBC SEEN NO ORGANISMS SEEN   Final   Culture   Final    CULTURE REINCUBATED FOR BETTER GROWTH Performed at [facility], [address] [place], [place] [address]    Report Status PENDING  Incomplete     Labs: Basic Metabolic Panel: Recent Labs  Lab 11/04/23 2234 11/05/23 0138 11/05/23 0500 11/07/23 0440  NA 141  --  137 139  K 3.7  --  3.9 3.9  CL 105  --  105 105  CO2 25  --  22 22  GLUCOSE 131*  --  158* 114*  BUN 11  --  12 14  CREATININE 0.78 0.66 0.71 0.80  CALCIUM 8.7*  --  8.1* 8.8*  MG  --   --   --  2.1   Liver Function Tests: Recent Labs  Lab 11/04/23 2234  AST 18  ALT 20  ALKPHOS 121  BILITOT 1.3*  PROT 7.8  ALBUMIN 3.6   CBC: Recent Labs  Lab 11/04/23 2234 11/05/23 0138 11/05/23 0500 11/07/23 0440  WBC 15.7* 16.4* 15.3* 11.4*  NEUTROABS 11.8*  --  12.0*  --   HGB 13.9 12.9* 12.6* 11.7*  HCT 45.1 41.6 41.6 38.5*  MCV 85.3 85.4 86.5 85.9  PLT 395 377 351 347   CBG: Recent Labs  Lab 11/06/23 0726 11/06/23 1131 11/06/23 1642 11/06/23 2050 11/07/23 0757  GLUCAP 123* 142* 115* 126* 112*   Hgb A1c Recent Labs    11/05/23 0138  HGBA1C 9.7*   Lipid Profile No results for input(s): "CHOL", "HDL", "LDLCALC", "TRIG", "CHOLHDL", "LDLDIRECT" in the last 72 hours. Thyroid function studies No results for input(s): "TSH", "T4TOTAL", "T3FREE", "THYROIDAB" in the last 72 hours.  Invalid input(s): "FREET3" Urinalysis    Component Value Date/Time   COLORURINE STRAW (A) 11/04/2023 2307   APPEARANCEUR CLEAR 11/04/2023 2307   LABSPEC 1.008 11/04/2023 2307    PHURINE 7.0 11/04/2023 2307   GLUCOSEU NEGATIVE 11/04/2023 2307   GLUCOSEU 500 (?) 01/17/2010 0000   HGBUR NEGATIVE 11/04/2023 2307   BILIRUBINUR NEGATIVE 11/04/2023 2307   BILIRUBINUR neg 09/27/2013 1805   KETONESUR NEGATIVE 11/04/2023 2307   PROTEINUR 100 (A) 11/04/2023 2307   UROBILINOGEN 0.2 07/06/2015 1923   NITRITE NEGATIVE 11/04/2023 2307   LEUKOCYTESUR NEGATIVE 11/04/2023 2307    FURTHER DISCHARGE INSTRUCTIONS:   Get Medicines reviewed and adjusted: Please take all your medications with you for your next visit with your Primary MD   Laboratory/radiological data: Please request your Primary MD to go over all hospital tests and procedure/radiological results at the follow up, please ask your Primary MD to get all Hospital records sent to his/her office.   In some cases, they will be blood work, cultures and biopsy results pending at the time of your discharge. Please request that your primary care M.D. goes through all the records of your hospital data and follows up on these results.   Also Note the following: If you experience worsening of your admission symptoms, develop shortness of breath, life threatening emergency, suicidal or homicidal thoughts you must seek medical attention immediately by calling 911 or calling your MD immediately  if symptoms less severe.   You must read complete instructions/literature along with all the possible adverse reactions/side effects for all the Medicines you take and that have been prescribed to you. Take any new Medicines after you have completely understood and accpet all the possible adverse reactions/side effects.  Do not drive when taking Pain medications or sleeping medications (Benzodaizepines)   Do not take more than prescribed Pain, Sleep and Anxiety Medications. It is not advisable to combine anxiety,sleep and pain medications without talking with your primary care practitioner   Special Instructions: If you have smoked or  chewed Tobacco  in the last 2 yrs please stop smoking, stop any regular Alcohol  and or any Recreational drug use.   Wear Seat belts while driving.   Please note: You were cared for by a hospitalist during your hospital stay. Once you are discharged, your primary care physician will handle any further medical issues. Please note that NO REFILLS for any discharge medications will be authorized once you are discharged, as it is imperative that you return to your primary care physician (or establish a relationship with a primary care physician if you do not have one) for your post hospital discharge needs so that they can reassess your need for medications and monitor your lab values.  Time coordinating discharge: 35 minutes  SIGNED:  Pamella Pert, MD, PhD 11/07/2023, 10:21 AM

## 2023-11-10 LAB — CULTURE, BLOOD (ROUTINE X 2): Culture: NO GROWTH

## 2023-11-11 DIAGNOSIS — Z125 Encounter for screening for malignant neoplasm of prostate: Secondary | ICD-10-CM | POA: Diagnosis not present

## 2023-11-11 DIAGNOSIS — E1165 Type 2 diabetes mellitus with hyperglycemia: Secondary | ICD-10-CM | POA: Diagnosis not present

## 2023-11-18 DIAGNOSIS — E1121 Type 2 diabetes mellitus with diabetic nephropathy: Secondary | ICD-10-CM | POA: Diagnosis not present

## 2023-11-18 DIAGNOSIS — I10 Essential (primary) hypertension: Secondary | ICD-10-CM | POA: Diagnosis not present

## 2023-11-18 DIAGNOSIS — E1165 Type 2 diabetes mellitus with hyperglycemia: Secondary | ICD-10-CM | POA: Diagnosis not present

## 2024-02-10 DIAGNOSIS — E1121 Type 2 diabetes mellitus with diabetic nephropathy: Secondary | ICD-10-CM | POA: Diagnosis not present

## 2024-02-10 DIAGNOSIS — E1165 Type 2 diabetes mellitus with hyperglycemia: Secondary | ICD-10-CM | POA: Diagnosis not present

## 2024-02-18 DIAGNOSIS — Z1331 Encounter for screening for depression: Secondary | ICD-10-CM | POA: Diagnosis not present

## 2024-02-18 DIAGNOSIS — L0591 Pilonidal cyst without abscess: Secondary | ICD-10-CM | POA: Diagnosis not present

## 2024-02-18 DIAGNOSIS — I10 Essential (primary) hypertension: Secondary | ICD-10-CM | POA: Diagnosis not present

## 2024-02-18 DIAGNOSIS — E1165 Type 2 diabetes mellitus with hyperglycemia: Secondary | ICD-10-CM | POA: Diagnosis not present

## 2024-02-18 DIAGNOSIS — F902 Attention-deficit hyperactivity disorder, combined type: Secondary | ICD-10-CM | POA: Diagnosis not present

## 2024-03-02 ENCOUNTER — Other Ambulatory Visit: Payer: Self-pay | Admitting: Student

## 2024-03-02 DIAGNOSIS — J4520 Mild intermittent asthma, uncomplicated: Secondary | ICD-10-CM

## 2024-05-27 DIAGNOSIS — E1165 Type 2 diabetes mellitus with hyperglycemia: Secondary | ICD-10-CM | POA: Diagnosis not present

## 2024-05-27 DIAGNOSIS — E781 Pure hyperglyceridemia: Secondary | ICD-10-CM | POA: Diagnosis not present

## 2024-05-27 DIAGNOSIS — I10 Essential (primary) hypertension: Secondary | ICD-10-CM | POA: Diagnosis not present

## 2024-05-27 DIAGNOSIS — E782 Mixed hyperlipidemia: Secondary | ICD-10-CM | POA: Diagnosis not present

## 2024-06-02 DIAGNOSIS — Z1331 Encounter for screening for depression: Secondary | ICD-10-CM | POA: Diagnosis not present

## 2024-06-02 DIAGNOSIS — Z794 Long term (current) use of insulin: Secondary | ICD-10-CM | POA: Diagnosis not present

## 2024-06-02 DIAGNOSIS — E1165 Type 2 diabetes mellitus with hyperglycemia: Secondary | ICD-10-CM | POA: Diagnosis not present

## 2024-06-02 DIAGNOSIS — G4733 Obstructive sleep apnea (adult) (pediatric): Secondary | ICD-10-CM | POA: Diagnosis not present
